# Patient Record
Sex: Female | Born: 1985 | Race: White | Hispanic: Yes | Smoking: Never smoker
Health system: Southern US, Community
[De-identification: ages and names within clinical notes are randomized; demographics above are authoritative.]

## PROBLEM LIST (undated history)

## (undated) DIAGNOSIS — R112 Nausea with vomiting, unspecified: Secondary | ICD-10-CM

## (undated) DIAGNOSIS — IMO0002 Reserved for concepts with insufficient information to code with codable children: Secondary | ICD-10-CM

## (undated) DIAGNOSIS — Z975 Presence of (intrauterine) contraceptive device: Secondary | ICD-10-CM

## (undated) DIAGNOSIS — B977 Papillomavirus as the cause of diseases classified elsewhere: Secondary | ICD-10-CM

## (undated) DIAGNOSIS — R87629 Unspecified abnormal cytological findings in specimens from vagina: Secondary | ICD-10-CM

## (undated) DIAGNOSIS — Z789 Other specified health status: Secondary | ICD-10-CM

## (undated) DIAGNOSIS — R87619 Unspecified abnormal cytological findings in specimens from cervix uteri: Secondary | ICD-10-CM

## (undated) HISTORY — PX: ANKLE SURGERY: SHX546

## (undated) HISTORY — DX: Nausea with vomiting, unspecified: R11.2

## (undated) HISTORY — DX: Unspecified abnormal cytological findings in specimens from cervix uteri: R87.619

## (undated) HISTORY — PX: FRACTURE SURGERY: SHX138

## (undated) HISTORY — PX: KNEE SURGERY: SHX244

## (undated) HISTORY — DX: Presence of (intrauterine) contraceptive device: Z97.5

## (undated) HISTORY — PX: LEG SURGERY: SHX1003

## (undated) HISTORY — DX: Papillomavirus as the cause of diseases classified elsewhere: B97.7

## (undated) HISTORY — PX: DILATION AND CURETTAGE OF UTERUS: SHX78

## (undated) HISTORY — DX: Reserved for concepts with insufficient information to code with codable children: IMO0002

## (undated) HISTORY — DX: Unspecified abnormal cytological findings in specimens from vagina: R87.629

---
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
## 2007-07-19 ENCOUNTER — Other Ambulatory Visit: Admission: RE | Admit: 2007-07-19 | Discharge: 2007-07-19 | Payer: Self-pay | Admitting: Obstetrics & Gynecology

## 2008-08-12 ENCOUNTER — Emergency Department (HOSPITAL_COMMUNITY): Admission: EM | Admit: 2008-08-12 | Discharge: 2008-08-12 | Payer: Self-pay | Admitting: Emergency Medicine

## 2008-09-19 ENCOUNTER — Other Ambulatory Visit: Admission: RE | Admit: 2008-09-19 | Discharge: 2008-09-19 | Payer: Self-pay | Admitting: Obstetrics & Gynecology

## 2010-03-28 ENCOUNTER — Emergency Department (HOSPITAL_COMMUNITY): Admission: EM | Admit: 2010-03-28 | Discharge: 2010-03-28 | Payer: Self-pay | Admitting: Emergency Medicine

## 2010-10-03 ENCOUNTER — Emergency Department (HOSPITAL_COMMUNITY)
Admission: EM | Admit: 2010-10-03 | Discharge: 2010-10-03 | Payer: Self-pay | Source: Home / Self Care | Admitting: Emergency Medicine

## 2010-10-24 ENCOUNTER — Encounter (HOSPITAL_COMMUNITY)
Admission: RE | Admit: 2010-10-24 | Discharge: 2010-11-19 | Payer: Self-pay | Source: Home / Self Care | Attending: Obstetrics and Gynecology | Admitting: Obstetrics and Gynecology

## 2010-11-22 ENCOUNTER — Ambulatory Visit (HOSPITAL_COMMUNITY)
Admission: RE | Admit: 2010-11-22 | Discharge: 2010-11-22 | Disposition: A | Discharge status: Home / Self Care | Payer: Medicaid Other | Source: Ambulatory Visit | Attending: Rehabilitation | Admitting: Rehabilitation

## 2010-11-22 DIAGNOSIS — IMO0001 Reserved for inherently not codable concepts without codable children: Secondary | ICD-10-CM | POA: Insufficient documentation

## 2010-11-22 DIAGNOSIS — M6281 Muscle weakness (generalized): Secondary | ICD-10-CM | POA: Insufficient documentation

## 2010-11-22 DIAGNOSIS — R262 Difficulty in walking, not elsewhere classified: Secondary | ICD-10-CM | POA: Insufficient documentation

## 2010-11-22 DIAGNOSIS — M2550 Pain in unspecified joint: Secondary | ICD-10-CM | POA: Insufficient documentation

## 2010-11-26 ENCOUNTER — Ambulatory Visit (HOSPITAL_COMMUNITY)
Admission: RE | Admit: 2010-11-26 | Discharge: 2010-11-26 | Disposition: A | Discharge status: Home / Self Care | Payer: Medicaid Other | Source: Ambulatory Visit | Attending: Rehabilitation | Admitting: Rehabilitation

## 2010-11-26 DIAGNOSIS — IMO0001 Reserved for inherently not codable concepts without codable children: Secondary | ICD-10-CM | POA: Insufficient documentation

## 2010-11-26 DIAGNOSIS — M2550 Pain in unspecified joint: Secondary | ICD-10-CM | POA: Insufficient documentation

## 2010-11-26 DIAGNOSIS — M25676 Stiffness of unspecified foot, not elsewhere classified: Secondary | ICD-10-CM | POA: Insufficient documentation

## 2010-11-26 DIAGNOSIS — M25673 Stiffness of unspecified ankle, not elsewhere classified: Secondary | ICD-10-CM | POA: Insufficient documentation

## 2010-11-26 DIAGNOSIS — M6281 Muscle weakness (generalized): Secondary | ICD-10-CM | POA: Insufficient documentation

## 2010-11-26 DIAGNOSIS — R262 Difficulty in walking, not elsewhere classified: Secondary | ICD-10-CM | POA: Insufficient documentation

## 2010-11-26 DIAGNOSIS — M25579 Pain in unspecified ankle and joints of unspecified foot: Secondary | ICD-10-CM | POA: Insufficient documentation

## 2010-11-26 DIAGNOSIS — M79609 Pain in unspecified limb: Secondary | ICD-10-CM | POA: Insufficient documentation

## 2010-11-28 ENCOUNTER — Ambulatory Visit (HOSPITAL_COMMUNITY)
Admission: RE | Admit: 2010-11-28 | Discharge: 2010-11-28 | Disposition: A | Discharge status: Home / Self Care | Payer: Medicaid Other | Source: Ambulatory Visit | Attending: Rehabilitation | Admitting: Rehabilitation

## 2010-11-28 ENCOUNTER — Ambulatory Visit (HOSPITAL_COMMUNITY): Payer: Self-pay

## 2010-11-28 DIAGNOSIS — M25673 Stiffness of unspecified ankle, not elsewhere classified: Secondary | ICD-10-CM | POA: Insufficient documentation

## 2010-11-28 DIAGNOSIS — R262 Difficulty in walking, not elsewhere classified: Secondary | ICD-10-CM | POA: Insufficient documentation

## 2010-11-28 DIAGNOSIS — IMO0001 Reserved for inherently not codable concepts without codable children: Secondary | ICD-10-CM | POA: Insufficient documentation

## 2010-11-28 DIAGNOSIS — M25579 Pain in unspecified ankle and joints of unspecified foot: Secondary | ICD-10-CM | POA: Insufficient documentation

## 2010-11-28 DIAGNOSIS — M6281 Muscle weakness (generalized): Secondary | ICD-10-CM | POA: Insufficient documentation

## 2010-11-28 DIAGNOSIS — M25676 Stiffness of unspecified foot, not elsewhere classified: Secondary | ICD-10-CM | POA: Insufficient documentation

## 2010-12-03 ENCOUNTER — Ambulatory Visit (HOSPITAL_COMMUNITY)
Admission: RE | Admit: 2010-12-03 | Discharge: 2010-12-03 | Disposition: A | Discharge status: Home / Self Care | Payer: Medicaid Other | Source: Ambulatory Visit | Attending: Obstetrics and Gynecology | Admitting: Obstetrics and Gynecology

## 2010-12-03 DIAGNOSIS — M25579 Pain in unspecified ankle and joints of unspecified foot: Secondary | ICD-10-CM | POA: Insufficient documentation

## 2010-12-03 DIAGNOSIS — M6281 Muscle weakness (generalized): Secondary | ICD-10-CM | POA: Insufficient documentation

## 2010-12-03 DIAGNOSIS — R262 Difficulty in walking, not elsewhere classified: Secondary | ICD-10-CM | POA: Insufficient documentation

## 2010-12-03 DIAGNOSIS — M25673 Stiffness of unspecified ankle, not elsewhere classified: Secondary | ICD-10-CM | POA: Insufficient documentation

## 2010-12-03 DIAGNOSIS — M25569 Pain in unspecified knee: Secondary | ICD-10-CM | POA: Insufficient documentation

## 2010-12-03 DIAGNOSIS — M25676 Stiffness of unspecified foot, not elsewhere classified: Secondary | ICD-10-CM | POA: Insufficient documentation

## 2010-12-03 DIAGNOSIS — M25669 Stiffness of unspecified knee, not elsewhere classified: Secondary | ICD-10-CM | POA: Insufficient documentation

## 2010-12-03 DIAGNOSIS — IMO0001 Reserved for inherently not codable concepts without codable children: Secondary | ICD-10-CM | POA: Insufficient documentation

## 2010-12-05 ENCOUNTER — Ambulatory Visit (HOSPITAL_COMMUNITY)
Admission: RE | Admit: 2010-12-05 | Discharge: 2010-12-05 | Disposition: A | Discharge status: Home / Self Care | Payer: Medicaid Other | Source: Ambulatory Visit | Attending: *Deleted | Admitting: *Deleted

## 2011-03-07 NOTE — Op Note (Signed)
Judy Lang, Judy Lang                      ACCOUNT NO.:  [id number]   MEDICAL RECORD NO.:  [id number]                   PATIENT TYPE:  AMB   LOCATION:  DAY                                  FACILITY:  [facility]   PHYSICIAN:  Lazaro Arms, M.D.                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   PROCEDURE:  Cervical dilatation with suction and sharp uterine curettage.   SURGEON:  Lazaro Arms, M.D.   ANESTHESIA:  Laryngeal mask airway.   FINDINGS:  The patient had significantly elevated HCGs in the office, with a  very unusual appearance to the intrauterine pregnancy, consistent with a  molar pregnancy.  As a result, she is admitted for suction D&C for a  nonviable pregnancy and evaluation for a molar pregnancy.   DESCRIPTION OF PROCEDURE:  The patient was taken to the operating room and  placed in the supine position where she underwent laryngeal mask airway.  She was placed in the dorsal lithotomy position and prepped and draped in  the usual sterile fashion.  The bladder was drained.   A Graves speculum was placed.  The cervix was grasped.  A paracervical block  was placed.  The cervix was dilated serially to allow passage of a 9 curved  suction curette.  Curettage was performed, and removal of all intrauterine  contents was performed.  A good uterine cry was obtained.  There was minimal  bleeding.  Methergine was given prophylactically.  The instruments were  removed.   The patient was awakened from anesthesia and was taken to the recovery room  in good and stable condition.  All counts were correct.  She received Ancef  prophylactically.  She had minimal bleeding.      ___________________________________________                                            Lazaro Arms, M.D.   LHE/MEDQ  D:  03/26/2004  T:  03/27/2004  Job:  [id number]

## 2011-03-07 NOTE — H&P (Signed)
NAMEKEIR, FOLAND NO.:  [id number]   MEDICAL RECORD NO.:  [id number]                  PATIENT TYPE:   LOCATION:                                       FACILITY:   PHYSICIAN:  Lazaro Arms, M.D.                DATE OF BIRTH:  [birth year]   DATE OF ADMISSION:  DATE OF DISCHARGE:                                HISTORY & PHYSICAL   HISTORY OF PRESENT ILLNESS:  Judy Lang is a 25 year old Hispanic female  gravida 1, para 0, last menstrual period January 19, 2004 who came into the  office last week having some bleeding and an ultrasound that was very  unusual, was very complex.  There was no fetal pole.  There were large lakes  in the trophoblastic tissue and it reminded me of a molar pregnancy.  We had  a quantitative HCG that was 55,000 and a repeat that was 66,000.  Ultrasound  was repeated and confirmed the findings.  As a result, she is admitted for a  D and C for probable molar pregnancy.   PAST MEDICAL HISTORY:  Negative.   PAST SURGICAL HISTORY:  Negative.   OBSTETRICAL HISTORY:  She is nulliparous.   ALLERGIES:  None.   MEDICATIONS:  None.   REVIEW OF SYMPTOMS:  Negative.  She is O positive.   PHYSICAL EXAMINATION:  HEENT:  Unremarkable.  NECK:  Thyroid is normal.  LUNGS:  Clear.  HEART:  Regular rate and rhythm without murmurs, rubs or gallops.  BREASTS:  Without masses, discharge or skin changes.  ABDOMEN:  Benign.  PELVIC:  Benign.  EXTREMITIES:  Warm with no edema.  NEUROLOGICAL:  Grossly intact.   IMPRESSION:  Nonviable intrauterine pregnancy, probable molar pregnancy.   PLAN:  Patient is admitted for a suction and sharp dilatation and curettage.  Patient and her aunt, who is her legal Guardian, understand that this  probably is a molar pregnancy and will require special treatment after  delivery with following HCG's, etc.  They voiced understanding and will  proceed.     ___________________________________________                                 Lazaro Arms, M.D.   [name]  D:  03/25/2004  T:  03/25/2004  Job:  [id number]

## 2011-06-17 ENCOUNTER — Emergency Department (HOSPITAL_COMMUNITY)
Admission: EM | Admit: 2011-06-17 | Discharge: 2011-06-17 | Disposition: A | Discharge status: Home / Self Care | Payer: Medicaid Other | Attending: Emergency Medicine | Admitting: Emergency Medicine

## 2011-06-17 DIAGNOSIS — R110 Nausea: Secondary | ICD-10-CM | POA: Insufficient documentation

## 2011-06-17 DIAGNOSIS — Z91040 Latex allergy status: Secondary | ICD-10-CM | POA: Insufficient documentation

## 2011-06-17 DIAGNOSIS — F17200 Nicotine dependence, unspecified, uncomplicated: Secondary | ICD-10-CM | POA: Insufficient documentation

## 2011-06-17 DIAGNOSIS — R1084 Generalized abdominal pain: Secondary | ICD-10-CM

## 2011-06-17 LAB — URINALYSIS, ROUTINE W REFLEX MICROSCOPIC
Glucose, UA: NEGATIVE mg/dL
Ketones, ur: NEGATIVE mg/dL
Leukocytes, UA: NEGATIVE
Nitrite: NEGATIVE
Protein, ur: NEGATIVE mg/dL
Specific Gravity, Urine: 1.03 (ref 1.005–1.030)
Urobilinogen, UA: 0.2 mg/dL (ref 0.0–1.0)

## 2011-06-17 LAB — BASIC METABOLIC PANEL
BUN: 10 mg/dL (ref 6–23)
CO2: 26 mEq/L (ref 19–32)
Calcium: 9.6 mg/dL (ref 8.4–10.5)
Chloride: 100 mEq/L (ref 96–112)
Creatinine, Ser: 0.47 mg/dL — ABNORMAL LOW (ref 0.50–1.10)
GFR calc Af Amer: >60 mL/min (ref >60)

## 2011-06-17 LAB — CBC
MCHC: 33.3 g/dL (ref 30.0–36.0)
RBC: 4.44 MIL/uL (ref 3.87–5.11)
RDW: 13.5 % (ref 11.5–15.5)
WBC: 9.1 10*3/uL (ref 4.0–10.5)

## 2011-06-17 LAB — DIFFERENTIAL
Eosinophils Absolute: 0.1 10*3/uL (ref 0.0–0.7)
Eosinophils Relative: 2 % (ref 0–5)
Monocytes Absolute: 0.6 10*3/uL (ref 0.1–1.0)
Monocytes Relative: 7 % (ref 3–12)
Neutrophils Relative %: 57 % (ref 43–77)

## 2011-06-17 LAB — URINE MICROSCOPIC-ADD ON

## 2011-06-17 MED ORDER — ONDANSETRON HCL 4 MG PO TABS: 4.0000 mg | ORAL_TABLET | Freq: Four times a day (QID) | ORAL | Status: AC

## 2011-06-17 MED ORDER — MORPHINE SULFATE 4 MG/ML IJ SOLN
4.0000 mg | Freq: Once | INTRAMUSCULAR | Status: AC
  Administered 2011-06-17: 4 mg via INTRAVENOUS
  Filled 2011-06-17: qty 1

## 2011-06-17 MED ORDER — HYDROCODONE-ACETAMINOPHEN 5-325 MG PO TABS: 1.0000 | ORAL_TABLET | ORAL | Status: AC | PRN

## 2011-06-17 MED ORDER — SODIUM CHLORIDE 0.9 % IV SOLN
INTRAVENOUS | Status: DC
  Administered 2011-06-17: 13:00:00 via INTRAVENOUS

## 2011-06-17 MED ORDER — SODIUM CHLORIDE 0.9 % IV BOLUS (SEPSIS)
1000.0000 mL | Freq: Once | INTRAVENOUS | Status: AC
  Administered 2011-06-17: 1000 mL via INTRAVENOUS

## 2011-06-17 MED ORDER — ONDANSETRON HCL 4 MG/2ML IJ SOLN
4.0000 mg | Freq: Once | INTRAMUSCULAR | Status: AC
  Administered 2011-06-17: 4 mg via INTRAVENOUS
  Filled 2011-06-17: qty 2

## 2011-06-17 NOTE — ED Notes (Signed)
Patient given Rx for Hydrocodone [facility] 5-325

## 2011-06-17 NOTE — ED Notes (Signed)
Pt actively vomiting; Dr. Adriana Simas notified and orders rec'd; medicated for nausea; NS bolus complete; NS 2nd liter infusing per pump at 127ml/h as ordered.

## 2011-06-17 NOTE — ED Notes (Signed)
Complain of abd pain, vomiting and sweating at night

## 2011-06-17 NOTE — ED Provider Notes (Signed)
History   Chart scribed for Donnetta Hutching, MD by [facility]; the patient was seen in room [facility]; this patient's care was started at 10:26 AM.    CSN: [id number] Arrival date & time: 06/17/2011  9:49 AM  Chief Complaint  Patient presents with  . Emesis   HPI Judy Lang is a 25 y.o. female who presents to the Emergency Department complaining of abd pain. Pt reports midline suprapubic abd pain onset this AM, constant pain. Pt also reports vomiting every morning for the past week. Decreased appetite this AM but eating normally prior to today. No change in urination or BMs. Denies diarrhea, fever, chills, dysuria, vaginal bleeding, or vaginal discharge. LNMP 05-17-11, reports it was very light and only lasted 2 days. States it is possible she is pregnant.  No PCP   History reviewed. No pertinent past medical history.  Past Surgical History  Procedure Date  . Fracture surgery     History reviewed. No pertinent family history.  History  Substance Use Topics  . Smoking status: Current Some Day Smoker  . Smokeless tobacco: Not on file  . Alcohol Use: No    OB History    Grav Para Term Preterm Abortions TAB SAB Ect Mult Living                 Previous Medications   CHOLECALCIFEROL (VITAMIN D PO)    Take 1 tablet by mouth 2 (two) times daily.     OYSTER SHELL (OYSTER CALCIUM) 500 MG TABS    Take 500 mg by mouth 2 (two) times daily.       Allergies as of 06/17/2011 - Review Complete 06/17/2011  Allergen Reaction Noted  . Latex Itching 06/17/2011      Review of Systems 10 Systems reviewed and are negative for acute change except as noted in the HPI.  Physical Exam  BP 103/74  Pulse 72  Temp(Src) 98.2 F (36.8 C) (Oral)  Resp 16  Ht 5\' 4"  (1.626 m)  Wt 109 lb (49.442 kg)  BMI 18.71 kg/m2  SpO2 99%  LMP 05/17/2011  Physical Exam  Nursing note and vitals reviewed. Constitutional: She is oriented to person, place, and time. No distress.       Appearance  consistent with age of record  HENT:  Head: Normocephalic and atraumatic.  Right Ear: External ear normal.  Left Ear: External ear normal.  Nose: Nose normal.  Mouth/Throat: Oropharynx is clear and moist.  Eyes: Conjunctivae are normal.  Neck: Neck supple.  Cardiovascular: Normal rate and regular rhythm.  Exam reveals no gallop and no friction rub.   No murmur heard. Pulmonary/Chest: Effort normal and breath sounds normal. She has no wheezes. She has no rhonchi. She has no rales. She exhibits no tenderness.  Abdominal: Soft. Bowel sounds are normal. There is tenderness (suprapubic).  Musculoskeletal: Normal range of motion.       Normal appearance of extremities  Neurological: She is alert and oriented to person, place, and time. No sensory deficit.  Skin: No rash noted.       Color normal  Psychiatric: She has a normal mood and affect.    ED Course  Procedures  OTHER DATA REVIEWED: Nursing notes and vital signs reviewed. Prior records reviewed.   LABS / RADIOLOGY: Results for orders placed during the hospital encounter of 06/17/11  POCT PREGNANCY, URINE      Component Value Range   Preg Test, Ur NEGATIVE    URINALYSIS, ROUTINE W REFLEX MICROSCOPIC  Component Value Range   Color, Urine YELLOW  YELLOW    Appearance CLEAR  CLEAR    Specific Gravity, Urine 1.030  1.005 - 1.030    pH 5.5  5.0 - 8.0    Glucose, UA NEGATIVE  NEGATIVE (mg/dL)   Hgb urine dipstick TRACE (*) NEGATIVE    Bilirubin Urine NEGATIVE  NEGATIVE    Ketones, ur NEGATIVE  NEGATIVE (mg/dL)   Protein, ur NEGATIVE  NEGATIVE (mg/dL)   Urobilinogen, UA 0.2  0.0 - 1.0 (mg/dL)   Nitrite NEGATIVE  NEGATIVE    Leukocytes, UA NEGATIVE  NEGATIVE   CBC      Component Value Range   WBC 9.1  4.0 - 10.5 (K/uL)   RBC 4.44  3.87 - 5.11 (MIL/uL)   Hemoglobin 13.7  12.0 - 15.0 (g/dL)   HCT 40.1  02.7 - 25.3 (%)   MCV 92.6  78.0 - 100.0 (fL)   MCH 30.9  26.0 - 34.0 (pg)   MCHC 33.3  30.0 - 36.0 (g/dL)   RDW  66.4  40.3 - 47.4 (%)   Platelets 265  150 - 400 (K/uL)  DIFFERENTIAL      Component Value Range   Neutrophils Relative 57  43 - 77 (%)   Neutro Abs 5.2  1.7 - 7.7 (K/uL)   Lymphocytes Relative 34  12 - 46 (%)   Lymphs Abs 3.1  0.7 - 4.0 (K/uL)   Monocytes Relative 7  3 - 12 (%)   Monocytes Absolute 0.6  0.1 - 1.0 (K/uL)   Eosinophils Relative 2  0 - 5 (%)   Eosinophils Absolute 0.1  0.0 - 0.7 (K/uL)   Basophils Relative 0  0 - 1 (%)   Basophils Absolute 0.0  0.0 - 0.1 (K/uL)  BASIC METABOLIC PANEL      Component Value Range   Sodium 136  135 - 145 (mEq/L)   Potassium 3.3 (*) 3.5 - 5.1 (mEq/L)   Chloride 100  96 - 112 (mEq/L)   CO2 26  19 - 32 (mEq/L)   Glucose, Bld 104 (*) 70 - 99 (mg/dL)   BUN 10  6 - 23 (mg/dL)   Creatinine, Ser 2.59 (*) 0.50 - 1.10 (mg/dL)   Calcium 9.6  8.4 - 56.3 (mg/dL)   GFR calc non Af Amer >60  >60 (mL/min)   GFR calc Af Amer >60  >60 (mL/min)  URINE MICROSCOPIC-ADD ON      Component Value Range   Squamous Epithelial / LPF FEW (*) RARE    RBC / HPF 0-2  <3 (RBC/hpf)   Bacteria, UA RARE  RARE      ED COURSE: 2:27 PM Pt still c/o lower abd pain and persistent nausea, requesting more pain meds.   All results reviewed and discussed with pt, questions answered, pt agreeable with plan.   MDM: Patient has no vaginal bleeding or discharge. No acute abdomen on physical exam. Will hydrate, labs, pain medicine. Recheck at 1600. Patient feeling much better. No acute abdomen. IMPRESSION: 1. Abdominal pain, generalized       MEDS GIVEN IN ED: 0.9 %  sodium chloride infusion (  Intravenous New Bag 06/17/11 1320)  sodium chloride 0.9 % bolus 1,000 mL (1000 mL Intravenous Given 06/17/11 1119)  ondansetron (ZOFRAN) injection 4 mg (4 mg Intravenous Given 06/17/11 1119)  morphine injection 4 mg (4 mg Intravenous Given 06/17/11 1119)  ondansetron (ZOFRAN) injection 4 mg (4 mg Intravenous Given 06/17/11 1317)  DISCHARGE MEDICATIONS: New Prescriptions    HYDROCODONE-ACETAMINOPHEN (NORCO) 5-325 MG PER TABLET    Take 1-2 tablets by mouth every 4 (four) hours as needed for pain.   ONDANSETRON (ZOFRAN) 4 MG TABLET    Take 1 tablet (4 mg total) by mouth every 6 (six) hours.     SCRIBE ATTESTATION:I personally performed the services described in this documentation, which was scribed in my presence. The recorded information has been reviewed and considered. Donnetta Hutching, MD     Donnetta Hutching, MD 06/17/11 [phone]

## 2011-07-21 LAB — PREGNANCY, URINE: Preg Test, Ur: NEGATIVE

## 2011-07-31 DIAGNOSIS — S82202B Unspecified fracture of shaft of left tibia, initial encounter for open fracture type I or II: Secondary | ICD-10-CM | POA: Insufficient documentation

## 2011-09-26 ENCOUNTER — Other Ambulatory Visit: Payer: Self-pay | Admitting: Adult Health

## 2011-09-26 ENCOUNTER — Other Ambulatory Visit (HOSPITAL_COMMUNITY)
Admission: RE | Admit: 2011-09-26 | Discharge: 2011-09-26 | Disposition: A | Discharge status: Home / Self Care | Payer: Medicaid Other | Source: Ambulatory Visit | Attending: Obstetrics and Gynecology | Admitting: Obstetrics and Gynecology

## 2011-09-26 DIAGNOSIS — Z01419 Encounter for gynecological examination (general) (routine) without abnormal findings: Secondary | ICD-10-CM | POA: Insufficient documentation

## 2011-09-26 DIAGNOSIS — Z113 Encounter for screening for infections with a predominantly sexual mode of transmission: Secondary | ICD-10-CM | POA: Insufficient documentation

## 2011-09-26 LAB — OB RESULTS CONSOLE GC/CHLAMYDIA: Chlamydia: NEGATIVE

## 2011-09-26 LAB — OB RESULTS CONSOLE HEPATITIS B SURFACE ANTIGEN: Hepatitis B Surface Ag: NEGATIVE

## 2011-10-21 NOTE — L&D Delivery Note (Signed)
Delivery Note At 4:45 AM a viable female was delivered via Vaginal, Spontaneous Delivery Homero Fellers Breech Presentation ).  APGAR: 8,9 ; weight .   Placenta status: Intact, Expressed.  Cord: 3 vessels with the following complications: None.  Cord pH: 7.31  Anesthesia: Local  Episiotomy: None Lacerations: 2nd degree;Perineal;Labial Suture Repair: vicryl rapide Est. Blood Loss (mL): 500  Mom to postpartum.  Baby to nursery-stable.  Kern Gingras H. 04/25/2012, 5:25 AM

## 2012-04-25 ENCOUNTER — Encounter (HOSPITAL_COMMUNITY): Payer: Self-pay | Admitting: *Deleted

## 2012-04-25 ENCOUNTER — Inpatient Hospital Stay (HOSPITAL_COMMUNITY)
Admission: AD | Admit: 2012-04-25 | Discharge: 2012-04-26 | DRG: 775 | Disposition: A | Discharge status: Home / Self Care | Payer: Medicaid Other | Source: Ambulatory Visit | Attending: Obstetrics & Gynecology | Admitting: Obstetrics & Gynecology

## 2012-04-25 DIAGNOSIS — O321XX0 Maternal care for breech presentation, not applicable or unspecified: Secondary | ICD-10-CM

## 2012-04-25 HISTORY — DX: Other specified health status: Z78.9

## 2012-04-25 LAB — CBC
Hemoglobin: 12.8 g/dL (ref 12.0–15.0)
MCH: 30.8 pg (ref 26.0–34.0)
MCHC: 33.6 g/dL (ref 30.0–36.0)
Platelets: 183 10*3/uL (ref 150–400)

## 2012-04-25 LAB — RPR: RPR Ser Ql: NONREACTIVE

## 2012-04-25 LAB — ABO/RH: ABO/RH(D): O POS

## 2012-04-25 LAB — OB RESULTS CONSOLE ABO/RH

## 2012-04-25 MED ORDER — PRENATAL MULTIVITAMIN CH
1.0000 | ORAL_TABLET | Freq: Every day | ORAL | Status: DC
  Administered 2012-04-25 – 2012-04-26 (×2): 1 via ORAL
  Filled 2012-04-25 (×2): qty 1

## 2012-04-25 MED ORDER — HYDROXYZINE HCL 50 MG PO TABS
50.0000 mg | ORAL_TABLET | Freq: Four times a day (QID) | ORAL | Status: DC | PRN
  Filled 2012-04-25: qty 1

## 2012-04-25 MED ORDER — ONDANSETRON HCL 4 MG PO TABS: 4.0000 mg | ORAL_TABLET | ORAL | Status: DC | PRN

## 2012-04-25 MED ORDER — OXYTOCIN BOLUS FROM INFUSION
250.0000 mL | Freq: Once | INTRAVENOUS | Status: DC
  Filled 2012-04-25: qty 500

## 2012-04-25 MED ORDER — ZOLPIDEM TARTRATE 5 MG PO TABS: 5.0000 mg | ORAL_TABLET | Freq: Every evening | ORAL | Status: DC | PRN

## 2012-04-25 MED ORDER — NALBUPHINE SYRINGE 5 MG/0.5 ML
5.0000 mg | INJECTION | INTRAMUSCULAR | Status: DC | PRN
  Filled 2012-04-25: qty 1

## 2012-04-25 MED ORDER — IBUPROFEN 600 MG PO TABS
600.0000 mg | ORAL_TABLET | Freq: Four times a day (QID) | ORAL | Status: DC | PRN
  Filled 2012-04-25 (×5): qty 1

## 2012-04-25 MED ORDER — HYDROMORPHONE HCL PF 1 MG/ML IJ SOLN
INTRAMUSCULAR | Status: AC
  Administered 2012-04-25: 2 mg
  Filled 2012-04-25: qty 2

## 2012-04-25 MED ORDER — HYDROXYZINE HCL 50 MG/ML IM SOLN
50.0000 mg | Freq: Four times a day (QID) | INTRAMUSCULAR | Status: DC | PRN
  Filled 2012-04-25: qty 1

## 2012-04-25 MED ORDER — TETANUS-DIPHTH-ACELL PERTUSSIS 5-2.5-18.5 LF-MCG/0.5 IM SUSP
0.5000 mL | Freq: Once | INTRAMUSCULAR | Status: AC
  Administered 2012-04-26: 0.5 mL via INTRAMUSCULAR
  Filled 2012-04-25: qty 0.5

## 2012-04-25 MED ORDER — ONDANSETRON HCL 4 MG/2ML IJ SOLN: 4.0000 mg | INTRAMUSCULAR | Status: DC | PRN

## 2012-04-25 MED ORDER — ONDANSETRON HCL 4 MG/2ML IJ SOLN: 4.0000 mg | Freq: Four times a day (QID) | INTRAMUSCULAR | Status: DC | PRN

## 2012-04-25 MED ORDER — OXYTOCIN 40 UNITS IN LACTATED RINGERS INFUSION - SIMPLE MED
INTRAVENOUS | Status: AC
  Administered 2012-04-25: 40 [IU]
  Filled 2012-04-25: qty 1000

## 2012-04-25 MED ORDER — LACTATED RINGERS IV SOLN: INTRAVENOUS | Status: DC

## 2012-04-25 MED ORDER — IBUPROFEN 600 MG PO TABS
600.0000 mg | ORAL_TABLET | Freq: Four times a day (QID) | ORAL | Status: DC
  Administered 2012-04-25 – 2012-04-26 (×5): 600 mg via ORAL

## 2012-04-25 MED ORDER — OXYCODONE-ACETAMINOPHEN 5-325 MG PO TABS
1.0000 | ORAL_TABLET | ORAL | Status: DC | PRN
  Administered 2012-04-25: 1 via ORAL
  Filled 2012-04-25: qty 1

## 2012-04-25 MED ORDER — SENNOSIDES-DOCUSATE SODIUM 8.6-50 MG PO TABS
2.0000 | ORAL_TABLET | Freq: Every day | ORAL | Status: DC
  Administered 2012-04-25: 2 via ORAL

## 2012-04-25 MED ORDER — DIBUCAINE 1 % RE OINT: 1.0000 "application " | TOPICAL_OINTMENT | RECTAL | Status: DC | PRN

## 2012-04-25 MED ORDER — LANOLIN HYDROUS EX OINT: TOPICAL_OINTMENT | CUTANEOUS | Status: DC | PRN

## 2012-04-25 MED ORDER — ACETAMINOPHEN 325 MG PO TABS: 650.0000 mg | ORAL_TABLET | ORAL | Status: DC | PRN

## 2012-04-25 MED ORDER — LIDOCAINE HCL (PF) 1 % IJ SOLN
INTRAMUSCULAR | Status: AC
  Filled 2012-04-25: qty 30

## 2012-04-25 MED ORDER — BENZOCAINE-MENTHOL 20-0.5 % EX AERO
1.0000 "application " | INHALATION_SPRAY | CUTANEOUS | Status: DC | PRN
  Administered 2012-04-25: 1 via TOPICAL
  Filled 2012-04-25: qty 56

## 2012-04-25 MED ORDER — METHYLERGONOVINE MALEATE 0.2 MG/ML IJ SOLN
INTRAMUSCULAR | Status: AC
  Administered 2012-04-25: 0.2 mg
  Filled 2012-04-25: qty 1

## 2012-04-25 MED ORDER — OXYTOCIN 40 UNITS IN LACTATED RINGERS INFUSION - SIMPLE MED: 62.5000 mL/h | Freq: Once | INTRAVENOUS | Status: DC

## 2012-04-25 MED ORDER — LACTATED RINGERS IV SOLN
500.0000 mL | INTRAVENOUS | Status: DC | PRN
Start: 2012-04-25 — End: 2012-04-26

## 2012-04-25 MED ORDER — LIDOCAINE HCL (PF) 1 % IJ SOLN
30.0000 mL | INTRAMUSCULAR | Status: DC | PRN
  Filled 2012-04-25: qty 30

## 2012-04-25 MED ORDER — OXYTOCIN 10 UNIT/ML IJ SOLN
INTRAMUSCULAR | Status: AC
  Filled 2012-04-25: qty 2

## 2012-04-25 MED ORDER — CITRIC ACID-SODIUM CITRATE 334-500 MG/5ML PO SOLN: 30.0000 mL | ORAL | Status: DC | PRN

## 2012-04-25 MED ORDER — WITCH HAZEL-GLYCERIN EX PADS: 1.0000 "application " | MEDICATED_PAD | CUTANEOUS | Status: DC | PRN

## 2012-04-25 MED ORDER — FLEET ENEMA 7-19 GM/118ML RE ENEM: 1.0000 | ENEMA | RECTAL | Status: DC | PRN

## 2012-04-25 MED ORDER — OXYCODONE-ACETAMINOPHEN 5-325 MG PO TABS: 1.0000 | ORAL_TABLET | ORAL | Status: DC | PRN

## 2012-04-25 MED ORDER — SIMETHICONE 80 MG PO CHEW: 80.0000 mg | CHEWABLE_TABLET | ORAL | Status: DC | PRN

## 2012-04-25 MED ORDER — DIPHENHYDRAMINE HCL 25 MG PO CAPS: 25.0000 mg | ORAL_CAPSULE | Freq: Four times a day (QID) | ORAL | Status: DC | PRN

## 2012-04-25 NOTE — H&P (Signed)
Judy Lang is a 26 y.o. female presenting with ctx and an urge to push. She receives her prenatal care at [facility] and had an essentially unremarkable course of care. History OB History    Grav Para Term Preterm Abortions TAB SAB Ect Mult Living   1              No past medical history on file. Past Surgical History  Procedure Date  . Fracture surgery    Family History: family history is not on file. Social History:  reports that she has been smoking.  She does not have any smokeless tobacco history on file. She reports that she does not drink alcohol or use illicit drugs.   Prenatal Transfer Tool  Maternal Diabetes: No Genetic Screening: Normal Maternal Ultrasounds/Referrals: Normal Fetal Ultrasounds or other Referrals:  None Maternal Substance Abuse:  Yes:  Type: Marijuana Significant Maternal Medications:  None Significant Maternal Lab Results:  Lab values include: Group B Strep negative Other Comments:  None  ROS    Blood pressure 120/71, pulse 69, last menstrual period 05/17/2011. Maternal Exam:  Introitus: Cx completely dilated with breech visible during involuntary pushing; bedside US also confirmed breech     Physical Exam  Constitutional: She is oriented to person, place, and time. She appears well-developed and well-nourished.  HENT:  Head: Normocephalic.  Cardiovascular: Normal rate.   Respiratory: Effort normal.  Musculoskeletal: Normal range of motion.  Neurological: She is alert and oriented to person, place, and time.  Skin: Skin is warm and dry.  Psychiatric: She has a normal mood and affect. Her behavior is normal.    Prenatal labs: ABO, Rh:  O+ Antibody:  neg Rubella:  imm RPR:   NR HBsAg:   neg HIV:   NR GBS:   neg  Assessment/Plan: IUP at 39.2 End first stage labor- breech  Dr Penne Lash called to MAU to attend birth. Labor too progressed to consider C/S or tx to L&D.   Judy Lang, Soyla 04/25/2012, 5:06 AM

## 2012-04-25 NOTE — Progress Notes (Signed)
CSW aware of consult, will await infant UDS results and then complete full consult with MOB.   

## 2012-04-25 NOTE — MAU Note (Signed)
Pt states contractions started at midnight. Arrived to MAU complaining of rectal pressure. SVE complete with BBOW & breech presentation. Dorathy Kinsman CNM called to bedside to confirm presentation. Pincus Badder CNM & Dr. Penne Lash called for delivery in MAU at 0440.

## 2012-04-26 ENCOUNTER — Encounter (HOSPITAL_COMMUNITY): Payer: Self-pay | Admitting: *Deleted

## 2012-04-26 LAB — CBC
HCT: 29.4 % — ABNORMAL LOW (ref 36.0–46.0)
Hemoglobin: 9.7 g/dL — ABNORMAL LOW (ref 12.0–15.0)
MCHC: 33 g/dL (ref 30.0–36.0)

## 2012-04-26 MED ORDER — IBUPROFEN 600 MG PO TABS: 600.0000 mg | ORAL_TABLET | Freq: Four times a day (QID) | ORAL | Status: AC | PRN

## 2012-04-26 NOTE — Discharge Summary (Signed)
I was present for the exam and agree with above.  [place], CNM 04/26/2012 8:59 AM

## 2012-04-26 NOTE — Progress Notes (Signed)
Ur chart review completed.  

## 2012-04-26 NOTE — H&P (Signed)
Pt seen and examined.  See delivery note.

## 2012-04-26 NOTE — Clinical Social Work Maternal (Signed)
    Clinical Social Work Department PSYCHOSOCIAL ASSESSMENT - MATERNAL/CHILD 04/26/2012  Patient:  Judy, Lang  Account Number:  [id number]  Admit Date:  04/25/2012  Marjo Bicker Name:   Cato Mulligan    Clinical Social Worker:  Andy Gauss   Date/Time:  04/26/2012 12:00 N  Date Referred:  04/26/2012   Referral source  CN     Referred reason  Substance Abuse   Other referral source:    I:  FAMILY / HOME ENVIRONMENT Child's legal guardian:  PARENT  Guardian - Name Guardian - Age Guardian - Address  Judy Lang [address]; [place], [place] [address]  Judy Lang 25    Other household support members/support persons Name Relationship DOB  Judy Lang MOTHER    Other support:   Pt's & FOB's family    II  PSYCHOSOCIAL DATA Information Source:  Patient Interview  Event organiser Employment:   Financial resources:  [employer] If Medicaid - County:  [employer] Other  [facility]  Food Stamps   School / Grade:   Maternity Care Coordinator / Child Services Coordination / Early Interventions:  Cultural issues impacting care:    III  STRENGTHS Strengths  Adequate Resources  Home prepared for Child (including basic supplies)  Supportive family/friends   Strength comment:    IV  RISK FACTORS AND CURRENT PROBLEMS Current Problem:  YES   Risk Factor & Current Problem Patient Issue Family Issue Risk Factor / Current Problem Comment  Substance Abuse Y N Hx of MJ use    V  SOCIAL WORK ASSESSMENT Pt admits to smoking MJ, "daily," prior to positive pregnancy confirmation at 8 weeks.  She continued to smoke for a couple more weeks until she stopped at 3 months.  She denies other illegal substance use.   Sw explained hospital drug testing policy and pt verbalized understanding.  UDS is negative, meconium results are pending.  She reports having good support and all the necessary supplies for the infant.  Sw will continue to monitor drug screen  results and make a referral if needed.      VI SOCIAL WORK PLAN Social Work Plan  No Further Intervention Required / No Barriers to Discharge   Type of pt/family education:   If child protective services report - county:   If child protective services report - date:   Information/referral to community resources comment:   Other social work plan:

## 2012-04-26 NOTE — Discharge Summary (Addendum)
Obstetric Discharge Summary Reason for Admission: onset of labor Prenatal Procedures: none Intrapartum Procedures: spontaneous vaginal delivery Postpartum Procedures: none Complications-Operative and Postpartum: 2nd degree perineal laceration Hemoglobin  Date Value Range Status  04/26/2012 9.7* 12.0 - 15.0 g/dL Final     REPEATED TO VERIFY     DELTA CHECK NOTED     HCT  Date Value Range Status  04/26/2012 29.4* 36.0 - 46.0 % Final   Patient reports bleeding has decreased and is now scant. She denied pain, but reports occasional mild cramping in her lower abdomen. She denied perineal pain. She offered no complaints. She is up ad lib and requesting to go home.  Physical Exam:  General: alert, cooperative and no distress Lochia: appropriate Uterine Fundus: firm Incision: none DVT Evaluation: No cords or calf tenderness. No significant calf/ankle edema.  Discharge Diagnoses: Term Pregnancy-delivered  Discharge Information: Date: 04/26/2012 Activity: unrestricted Diet: routine Medications: Ibuprofen Condition: stable Instructions: refer to practice specific booklet Discharge to: home Follow-up Information    Follow up with [facility]. Call in 1 week. (Indicate your interest to have a Depo Provera shot for birth control, and make appointment for postpartum follow up in 6 weeks.)    Contact information:   [address] [place] [address]          Newborn Data: Live born female  Birth Weight: 6 lb 1.9 oz (2775 g) APGAR: 8, 9  Mother is breastfeeding and reports baby is doing well.  Home with mother.  ** - Pt is requesting early discharge today. Nursing has been notified and they will inform the nursery and have pediatrics clear the baby for discharge. The patient has been informed of this process and expressed understanding.  She plans to maintain birth control with Depo Provera. See above Follow-Up information.  [place] 04/26/2012,  7:46 AM

## 2012-06-17 DIAGNOSIS — S82201A Unspecified fracture of shaft of right tibia, initial encounter for closed fracture: Secondary | ICD-10-CM | POA: Insufficient documentation

## 2013-02-02 ENCOUNTER — Encounter: Payer: Self-pay | Admitting: *Deleted

## 2013-02-02 DIAGNOSIS — B977 Papillomavirus as the cause of diseases classified elsewhere: Secondary | ICD-10-CM

## 2013-02-02 DIAGNOSIS — IMO0002 Reserved for concepts with insufficient information to code with codable children: Secondary | ICD-10-CM

## 2013-02-03 ENCOUNTER — Ambulatory Visit: Payer: Self-pay

## 2013-03-10 DIAGNOSIS — S5290XA Unspecified fracture of unspecified forearm, initial encounter for closed fracture: Secondary | ICD-10-CM | POA: Insufficient documentation

## 2013-03-10 DIAGNOSIS — Z09 Encounter for follow-up examination after completed treatment for conditions other than malignant neoplasm: Secondary | ICD-10-CM | POA: Insufficient documentation

## 2013-07-01 ENCOUNTER — Encounter: Payer: Self-pay | Admitting: Adult Health

## 2013-07-01 ENCOUNTER — Ambulatory Visit (INDEPENDENT_AMBULATORY_CARE_PROVIDER_SITE_OTHER): Payer: Medicaid Other | Admitting: Adult Health

## 2013-07-01 VITALS — BP 100/60 | Ht 64.0 in | Wt 101.0 lb

## 2013-07-01 DIAGNOSIS — Z3201 Encounter for pregnancy test, result positive: Secondary | ICD-10-CM

## 2013-07-01 DIAGNOSIS — Z3200 Encounter for pregnancy test, result unknown: Secondary | ICD-10-CM

## 2013-07-01 NOTE — Progress Notes (Signed)
Patient ID: Judy Lang, female   DOB: 1986-05-10, 27 y.o.   MRN: [id number] Pt here for UPT, pt given samples of PNV.

## 2013-07-05 ENCOUNTER — Other Ambulatory Visit: Payer: Self-pay | Admitting: Obstetrics & Gynecology

## 2013-07-05 DIAGNOSIS — O3680X0 Pregnancy with inconclusive fetal viability, not applicable or unspecified: Secondary | ICD-10-CM

## 2013-07-06 ENCOUNTER — Ambulatory Visit: Payer: Self-pay | Admitting: Obstetrics & Gynecology

## 2013-07-07 ENCOUNTER — Ambulatory Visit (INDEPENDENT_AMBULATORY_CARE_PROVIDER_SITE_OTHER): Payer: Medicaid Other

## 2013-07-07 DIAGNOSIS — O3680X0 Pregnancy with inconclusive fetal viability, not applicable or unspecified: Secondary | ICD-10-CM

## 2013-07-07 NOTE — Progress Notes (Signed)
U/S(7+6wks)- single IUP with +FCA noted, CRL c/w LMP dates, cx 3.4cm and closed, bilateral adnexa wnl, no free fluid noted within pelvis

## 2013-07-09 ENCOUNTER — Emergency Department (HOSPITAL_COMMUNITY)
Admission: EM | Admit: 2013-07-09 | Discharge: 2013-07-10 | Disposition: A | Discharge status: Home / Self Care | Payer: Medicaid Other | Attending: Emergency Medicine | Admitting: Emergency Medicine

## 2013-07-09 DIAGNOSIS — O209 Hemorrhage in early pregnancy, unspecified: Secondary | ICD-10-CM

## 2013-07-09 DIAGNOSIS — Z8619 Personal history of other infectious and parasitic diseases: Secondary | ICD-10-CM | POA: Insufficient documentation

## 2013-07-09 DIAGNOSIS — O200 Threatened abortion: Secondary | ICD-10-CM

## 2013-07-09 DIAGNOSIS — Z9889 Other specified postprocedural states: Secondary | ICD-10-CM | POA: Insufficient documentation

## 2013-07-09 DIAGNOSIS — Z91040 Latex allergy status: Secondary | ICD-10-CM | POA: Insufficient documentation

## 2013-07-10 ENCOUNTER — Encounter (HOSPITAL_COMMUNITY): Payer: Self-pay | Admitting: *Deleted

## 2013-07-10 LAB — URINALYSIS W MICROSCOPIC + REFLEX CULTURE
Bilirubin Urine: NEGATIVE
Ketones, ur: NEGATIVE mg/dL
Protein, ur: NEGATIVE mg/dL
Urobilinogen, UA: 0.2 mg/dL (ref 0.0–1.0)

## 2013-07-10 LAB — HCG, QUANTITATIVE, PREGNANCY: hCG, Beta Chain, Quant, S: 41767 m[IU]/mL — ABNORMAL HIGH (ref <5)

## 2013-07-10 LAB — WET PREP, GENITAL
Trich, Wet Prep: NONE SEEN
WBC, Wet Prep HPF POC: NONE SEEN
Yeast Wet Prep HPF POC: NONE SEEN

## 2013-07-10 LAB — CBC
MCHC: 33.2 g/dL (ref 30.0–36.0)
MCV: 90.7 fL (ref 78.0–100.0)
Platelets: 200 10*3/uL (ref 150–400)
RDW: 12.9 % (ref 11.5–15.5)
WBC: 7.9 10*3/uL (ref 4.0–10.5)

## 2013-07-10 NOTE — ED Notes (Signed)
Patient given discharge instruction, verbalized understand. Patient ambulatory out of the department with family  

## 2013-07-10 NOTE — ED Notes (Signed)
Pt states she is 2 months pregnant. Pt reports vaginal bleeding tonight while unloading groceries.
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
## 2013-07-11 LAB — GC/CHLAMYDIA PROBE AMP: CT Probe RNA: NEGATIVE

## 2013-07-12 ENCOUNTER — Ambulatory Visit (INDEPENDENT_AMBULATORY_CARE_PROVIDER_SITE_OTHER): Payer: Medicaid Other | Admitting: Obstetrics & Gynecology

## 2013-07-12 ENCOUNTER — Other Ambulatory Visit: Payer: Self-pay | Admitting: Obstetrics & Gynecology

## 2013-07-12 ENCOUNTER — Ambulatory Visit (INDEPENDENT_AMBULATORY_CARE_PROVIDER_SITE_OTHER): Payer: Medicaid Other

## 2013-07-12 ENCOUNTER — Encounter: Payer: Self-pay | Admitting: Obstetrics & Gynecology

## 2013-07-12 VITALS — BP 100/70 | Wt 98.0 lb

## 2013-07-12 DIAGNOSIS — O200 Threatened abortion: Secondary | ICD-10-CM

## 2013-07-12 DIAGNOSIS — Z331 Pregnant state, incidental: Secondary | ICD-10-CM

## 2013-07-12 DIAGNOSIS — Z1389 Encounter for screening for other disorder: Secondary | ICD-10-CM

## 2013-07-12 LAB — POCT URINALYSIS DIPSTICK
Ketones, UA: NEGATIVE
Protein, UA: NEGATIVE

## 2013-07-12 NOTE — Progress Notes (Signed)
U/S(8+4ws)-single IUP with +FCA noted, CRL c/w dates, cx long and closed, bilateral adnexa wnl, small Sub-Chorionic Hemmorhage noted=12x7mm, FHR=178 bpm

## 2013-07-12 NOTE — Patient Instructions (Addendum)
Threatened Miscarriage  Bleeding during the first 20 weeks of pregnancy is common. This is sometimes called a threatened miscarriage. This is a pregnancy that is threatening to end before the twentieth week of pregnancy. Often this bleeding stops with bed rest or decreased activities as suggested by your caregiver and the pregnancy continues without any more problems. You may be asked to not have sexual intercourse, have orgasms or use tampons until further notice. Sometimes a threatened miscarriage can progress to a complete or incomplete miscarriage. This may or may not require further treatment. Some miscarriages occur before a woman misses a menstrual period and knows she is pregnant.  Miscarriages occur in 15 to 20% of all pregnancies and usually occur during the first 13 weeks of the pregnancy. The exact cause of a miscarriage is usually never known. A miscarriage is natures way of ending a pregnancy that is abnormal or would not make it to term. There are some things that may put you at risk to have a miscarriage, such as:  · Hormone problems.  · Infection of the uterus or cervix.  · Chronic illness, diabetes for example, especially if it is not controlled.  · Abnormal shaped uterus.  · Fibroids in the uterus.  · Incompetent cervix (the cervix is too weak to hold the baby).  · Smoking.  · Drinking too much alcohol. It's best not to drink any alcohol when you are pregnant.  · Taking illegal drugs.  TREATMENT   When a miscarriage becomes complete and all products of conception (all the tissue in the uterus) have been passed, often no treatment is needed. If you think you passed tissue, save it in a container and take it to your doctor for evaluation. If the miscarriage is incomplete (parts of the fetus or placenta remain in the uterus), further treatment may be needed. The most common reason for further treatment is continued bleeding (hemorrhage) because pregnancy tissue did not pass out of the uterus. This  often occurs if a miscarriage is incomplete. Tissue left behind may also become infected. Treatment usually is dilatation and curettage (the removal of the remaining products of pregnancy. This can be done by a simple sucking procedure (suction curettage) or a simple scraping of the inside of the uterus. This may be done in the hospital or in the caregiver's office. This is only done when your caregiver knows that there is no chance for the pregnancy to proceed to term. This is determined by physical examination, negative pregnancy test, falling pregnancy hormone count and/or, an ultrasound revealing a dead fetus.  Miscarriages are often a very emotional time for prospective mothers and fathers. This is not you or your partners fault. It did not occur because of an inadequacy in you or your partner. Nearly all miscarriages occur because the pregnancy has started off wrongly. At least half of these pregnancies have a chromosomal abnormality. It is almost always not inherited. Others may have developmental problems with the fetus or placenta. This does not always show up even when the products miscarried are studied under the microscope. The miscarriage is nearly always not your fault and it is not likely that you could have prevented it from happening. If you are having emotional and grieving problems, talk to your health care provider and even seek counseling, if necessary, before getting pregnant again. You can begin trying for another pregnancy as soon as your caregiver says it is OK.  HOME CARE INSTRUCTIONS   · Your caregiver may order   bed rest depending on how much bleeding and cramping you are having. You may be limited to only getting up to go to the bathroom. You may be allowed to continue light activity. You may need to make arrangements for the care of your other children and for any other responsibilities.  · Keep track of the number of pads you use each day, how often you have to change pads and how  saturated (soaked) they are. Record this information.  · DO NOT USE TAMPONS. Do not douche, have sexual intercourse or orgasms until approved by your caregiver.  · You may receive a follow up appointment for re-evaluation of your pregnancy and a repeat blood test. Re-evaluation often occurs after 2 days and again in 4 to 6 weeks. It is very important that you follow-up in the recommended time period.  · If you are Rh negative and the father is Rh positive or you do not know the fathers' blood type, you may receive a shot (Rh immune globulin) to help prevent abnormal antibodies that can develop and affect the baby in any future pregnancies.  SEEK IMMEDIATE MEDICAL CARE IF:  · You have severe cramps in your stomach, back, or abdomen.  · You have a sudden onset of severe pain in the lower part of your abdomen.  · You develop chills.  · You run an unexplained temperature of 101° F (38.3° C) or higher.  · You pass large clots or tissue. Save any tissue for your caregiver to inspect.  · Your bleeding increases or you become light-headed, weak, or have fainting episodes.  · You have a gush of fluid from your vagina.  · You pass out. This could mean you have a tubal (ectopic) pregnancy.  Document Released: 10/06/2005 Document Revised: 12/29/2011 Document Reviewed: 05/22/2008  Exit[employer]® Patient Information ©2014 [employer].

## 2013-07-12 NOTE — Progress Notes (Signed)
Sonogram is reassuring with a small [place] bleed present and some blood in vault present. No sex until after 12 weeks or so. Keep scheduled appt

## 2013-07-13 ENCOUNTER — Encounter: Payer: Medicaid Other | Admitting: Obstetrics and Gynecology

## 2013-07-18 ENCOUNTER — Other Ambulatory Visit (HOSPITAL_COMMUNITY)
Admission: RE | Admit: 2013-07-18 | Discharge: 2013-07-18 | Disposition: A | Discharge status: Home / Self Care | Payer: Medicaid Other | Source: Ambulatory Visit | Attending: Adult Health | Admitting: Adult Health

## 2013-07-18 ENCOUNTER — Ambulatory Visit (INDEPENDENT_AMBULATORY_CARE_PROVIDER_SITE_OTHER): Payer: Medicaid Other | Admitting: Adult Health

## 2013-07-18 ENCOUNTER — Encounter: Payer: Self-pay | Admitting: Adult Health

## 2013-07-18 VITALS — BP 100/50 | Wt 103.0 lb

## 2013-07-18 DIAGNOSIS — O210 Mild hyperemesis gravidarum: Secondary | ICD-10-CM

## 2013-07-18 DIAGNOSIS — R112 Nausea with vomiting, unspecified: Secondary | ICD-10-CM

## 2013-07-18 DIAGNOSIS — Z113 Encounter for screening for infections with a predominantly sexual mode of transmission: Secondary | ICD-10-CM | POA: Insufficient documentation

## 2013-07-18 DIAGNOSIS — Z3481 Encounter for supervision of other normal pregnancy, first trimester: Secondary | ICD-10-CM

## 2013-07-18 DIAGNOSIS — Z1389 Encounter for screening for other disorder: Secondary | ICD-10-CM

## 2013-07-18 DIAGNOSIS — Z331 Pregnant state, incidental: Secondary | ICD-10-CM

## 2013-07-18 DIAGNOSIS — Z01419 Encounter for gynecological examination (general) (routine) without abnormal findings: Secondary | ICD-10-CM | POA: Insufficient documentation

## 2013-07-18 DIAGNOSIS — F1920 Other psychoactive substance dependence, uncomplicated: Secondary | ICD-10-CM

## 2013-07-18 HISTORY — DX: Nausea with vomiting, unspecified: R11.2

## 2013-07-18 LAB — CBC
MCHC: 33.3 g/dL (ref 30.0–36.0)
MCV: 89.7 fL (ref 78.0–100.0)
Platelets: 239 10*3/uL (ref 150–400)
RDW: 13.5 % (ref 11.5–15.5)
WBC: 8 10*3/uL (ref 4.0–10.5)

## 2013-07-18 LAB — POCT URINALYSIS DIPSTICK
Blood, UA: NEGATIVE
Glucose, UA: NEGATIVE
Leukocytes, UA: NEGATIVE
Nitrite, UA: NEGATIVE
Protein, UA: NEGATIVE

## 2013-07-18 NOTE — Progress Notes (Signed)
Pt here today for New OB visit. Pt states she is still having some brownish spotting. Pt denies any other problems or concerns at this time. Pt given CCNC form and lab consents to read over and sign.
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
## 2013-07-18 NOTE — Patient Instructions (Addendum)

## 2013-07-19 ENCOUNTER — Encounter: Payer: Self-pay | Admitting: Women's Health

## 2013-07-19 DIAGNOSIS — F1290 Cannabis use, unspecified, uncomplicated: Secondary | ICD-10-CM | POA: Insufficient documentation

## 2013-07-19 DIAGNOSIS — Z3480 Encounter for supervision of other normal pregnancy, unspecified trimester: Secondary | ICD-10-CM | POA: Insufficient documentation

## 2013-07-19 LAB — GC/CHLAMYDIA PROBE AMP
CT Probe RNA: NEGATIVE
GC Probe RNA: NEGATIVE

## 2013-07-19 LAB — HIV ANTIBODY (ROUTINE TESTING W REFLEX): HIV: NONREACTIVE

## 2013-07-19 LAB — SICKLE CELL SCREEN: Sickle Cell Screen: NEGATIVE

## 2013-07-19 LAB — ABO AND RH

## 2013-07-19 LAB — URINE CULTURE: Colony Count: NO GROWTH

## 2013-07-19 LAB — RUBELLA SCREEN: Rubella: 2.15 Index — ABNORMAL HIGH (ref <0.90)

## 2013-07-19 LAB — URINALYSIS
Bilirubin Urine: NEGATIVE
Hgb urine dipstick: NEGATIVE
Ketones, ur: NEGATIVE mg/dL
Nitrite: NEGATIVE
Protein, ur: NEGATIVE mg/dL
Urobilinogen, UA: 0.2 mg/dL (ref 0.0–1.0)

## 2013-07-19 LAB — DRUG SCREEN, URINE, NO CONFIRMATION
Barbiturate Quant, Ur: NEGATIVE
Benzodiazepines.: NEGATIVE
Creatinine,U: 50.5 mg/dL
Methadone: NEGATIVE
Propoxyphene: NEGATIVE

## 2013-07-19 LAB — HEPATITIS B SURFACE ANTIGEN: Hepatitis B Surface Ag: NEGATIVE

## 2013-07-19 LAB — TSH: TSH: 0.713 u[IU]/mL (ref 0.350–4.500)

## 2013-07-19 LAB — OXYCODONE SCREEN, UA, RFLX CONFIRM: Oxycodone Screen, Ur: NEGATIVE ng/mL

## 2013-07-23 ENCOUNTER — Encounter: Payer: Self-pay | Admitting: Women's Health

## 2013-07-27 ENCOUNTER — Telehealth: Payer: Self-pay | Admitting: Obstetrics and Gynecology

## 2013-07-27 NOTE — Telephone Encounter (Signed)
Voice mailbox not set up at 11:50am. JSY

## 2013-07-28 NOTE — Telephone Encounter (Signed)
Voice mailbox not set up at 4:24pm. JSY

## 2013-07-29 NOTE — Telephone Encounter (Signed)
Voice mailbox not set up @ 1:38 pm. JSY

## 2013-08-03 NOTE — Telephone Encounter (Signed)
Pt states she is feeling better now. Advised that we tried multiple times to reach her but her voice mailbox was not set up. JSY

## 2013-08-08 ENCOUNTER — Ambulatory Visit (INDEPENDENT_AMBULATORY_CARE_PROVIDER_SITE_OTHER): Payer: Medicaid Other

## 2013-08-08 ENCOUNTER — Other Ambulatory Visit: Payer: Self-pay | Admitting: Adult Health

## 2013-08-08 ENCOUNTER — Encounter (INDEPENDENT_AMBULATORY_CARE_PROVIDER_SITE_OTHER): Payer: Self-pay

## 2013-08-08 ENCOUNTER — Encounter: Payer: Self-pay | Admitting: Adult Health

## 2013-08-08 ENCOUNTER — Ambulatory Visit (INDEPENDENT_AMBULATORY_CARE_PROVIDER_SITE_OTHER): Payer: Medicaid Other | Admitting: Adult Health

## 2013-08-08 VITALS — BP 100/60 | Wt 103.0 lb

## 2013-08-08 DIAGNOSIS — F1920 Other psychoactive substance dependence, uncomplicated: Secondary | ICD-10-CM

## 2013-08-08 DIAGNOSIS — Z331 Pregnant state, incidental: Secondary | ICD-10-CM

## 2013-08-08 DIAGNOSIS — Z1389 Encounter for screening for other disorder: Secondary | ICD-10-CM

## 2013-08-08 DIAGNOSIS — Z36 Encounter for antenatal screening of mother: Secondary | ICD-10-CM

## 2013-08-08 DIAGNOSIS — Z3481 Encounter for supervision of other normal pregnancy, first trimester: Secondary | ICD-10-CM

## 2013-08-08 LAB — POCT URINALYSIS DIPSTICK
Blood, UA: NEGATIVE
Glucose, UA: NEGATIVE
Ketones, UA: NEGATIVE
Leukocytes, UA: NEGATIVE
Nitrite, UA: NEGATIVE

## 2013-08-08 NOTE — Progress Notes (Signed)
U/S(12+3wks)-active fetus, CRL c/w dates, cx long and closed, bilateral adnexa wnl, NB present, NT-1.57mm, Sub-Chorionic Hem=22 x 9mm (decreased in size)

## 2013-08-08 NOTE — Progress Notes (Signed)
Pt here today for routine visit and IT NT [place]. Pt denies any problems or concerns at this time.

## 2013-08-08 NOTE — Patient Instructions (Signed)
Pregnancy - Second Trimester The second trimester of pregnancy (3 to 6 months) is a period of rapid growth for you and your baby. At the end of the sixth month, your baby is about 9 inches long and weighs 1 1/2 pounds. You will begin to feel the baby move between 18 and 20 weeks of the pregnancy. This is called quickening. Weight gain is faster. A clear fluid (colostrum) may leak out of your breasts. You may feel small contractions of the womb (uterus). This is known as false labor or Braxton-Hicks contractions. This is like a practice for labor when the baby is ready to be born. Usually, the problems with morning sickness have usually passed by the end of your first trimester. Some women develop small dark blotches (called cholasma, mask of pregnancy) on their face that usually goes away after the baby is born. Exposure to the sun makes the blotches worse. Acne may also develop in some pregnant women and pregnant women who have acne, may find that it goes away. PRENATAL EXAMS  Blood work may continue to be done during prenatal exams. These tests are done to check on your health and the probable health of your baby. Blood work is used to follow your blood levels (hemoglobin). Anemia (low hemoglobin) is common during pregnancy. Iron and vitamins are given to help prevent this. You will also be checked for diabetes between 24 and 28 weeks of the pregnancy. Some of the previous blood tests may be repeated.  The size of the uterus is measured during each visit. This is to make sure that the baby is continuing to grow properly according to the dates of the pregnancy.  Your blood pressure is checked every prenatal visit. This is to make sure you are not getting toxemia.  Your urine is checked to make sure you do not have an infection, diabetes or protein in the urine.  Your weight is checked often to make sure gains are happening at the suggested rate. This is to ensure that both you and your baby are growing  normally.  Sometimes, an ultrasound is performed to confirm the proper growth and development of the baby. This is a test which bounces harmless sound waves off the baby so your caregiver can more accurately determine due dates. Sometimes, a test is done on the amniotic fluid surrounding the baby. This test is called an amniocentesis. The amniotic fluid is obtained by sticking a needle into the belly (abdomen). This is done to check the chromosomes in instances where there is a concern about possible genetic problems with the baby. It is also sometimes done near the end of pregnancy if an early delivery is required. In this case, it is done to help make sure the baby's lungs are mature enough for the baby to live outside of the womb. CHANGES OCCURING IN THE SECOND TRIMESTER OF PREGNANCY Your body goes through many changes during pregnancy. They vary from person to person. Talk to your caregiver about changes you notice that you are concerned about.  During the second trimester, you will likely have an increase in your appetite. It is normal to have cravings for certain foods. This varies from person to person and pregnancy to pregnancy.  Your lower abdomen will begin to bulge.  You may have to urinate more often because the uterus and baby are pressing on your bladder. It is also common to get more bladder infections during pregnancy. You can help this by drinking lots of fluids  and emptying your bladder before and after intercourse.  You may begin to get stretch marks on your hips, abdomen, and breasts. These are normal changes in the body during pregnancy. There are no exercises or medicines to take that prevent this change.  You may begin to develop swollen and bulging veins (varicose veins) in your legs. Wearing support hose, elevating your feet for 15 minutes, 3 to 4 times a day and limiting salt in your diet helps lessen the problem.  Heartburn may develop as the uterus grows and pushes up  against the stomach. Antacids recommended by your caregiver helps with this problem. Also, eating smaller meals 4 to 5 times a day helps.  Constipation can be treated with a stool softener or adding bulk to your diet. Drinking lots of fluids, and eating vegetables, fruits, and whole grains are helpful.  Exercising is also helpful. If you have been very active up until your pregnancy, most of these activities can be continued during your pregnancy. If you have been less active, it is helpful to start an exercise program such as walking.  Hemorrhoids may develop at the end of the second trimester. Warm sitz baths and hemorrhoid cream recommended by your caregiver helps hemorrhoid problems.  Backaches may develop during this time of your pregnancy. Avoid heavy lifting, wear low heal shoes, and practice good posture to help with backache problems.  Some pregnant women develop tingling and numbness of their hand and fingers because of swelling and tightening of ligaments in the wrist (carpel tunnel syndrome). This goes away after the baby is born.  As your breasts enlarge, you may have to get a bigger bra. Get a comfortable, cotton, support bra. Do not get a nursing bra until the last month of the pregnancy if you will be nursing the baby.  You may get a dark line from your belly button to the pubic area called the linea nigra.  You may develop rosy cheeks because of increase blood flow to the face.  You may develop spider looking lines of the face, neck, arms, and chest. These go away after the baby is born. HOME CARE INSTRUCTIONS   It is extremely important to avoid all smoking, herbs, alcohol, and unprescribed drugs during your pregnancy. These chemicals affect the formation and growth of the baby. Avoid these chemicals throughout the pregnancy to ensure the delivery of a healthy infant.  Most of your home care instructions are the same as suggested for the first trimester of your pregnancy.  Keep your caregiver's appointments. Follow your caregiver's instructions regarding medicine use, exercise, and diet.  During pregnancy, you are providing food for you and your baby. Continue to eat regular, well-balanced meals. Choose foods such as meat, fish, milk and other low fat dairy products, vegetables, fruits, and whole-grain breads and cereals. Your caregiver will tell you of the ideal weight gain.  A physical sexual relationship may be continued up until near the end of pregnancy if there are no other problems. Problems could include early (premature) leaking of amniotic fluid from the membranes, vaginal bleeding, abdominal pain, or other medical or pregnancy problems.  Exercise regularly if there are no restrictions. Check with your caregiver if you are unsure of the safety of some of your exercises. The greatest weight gain will occur in the last 2 trimesters of pregnancy. Exercise will help you:  Control your weight.  Get you in shape for labor and delivery.  Lose weight after you have the baby.  Wear  a good support or jogging bra for breast tenderness during pregnancy. This may help if worn during sleep. Pads or tissues may be used in the bra if you are leaking colostrum.  Do not use hot tubs, steam rooms or saunas throughout the pregnancy.  Wear your seat belt at all times when driving. This protects you and your baby if you are in an accident.  Avoid raw meat, uncooked cheese, cat litter boxes, and soil used by cats. These carry germs that can cause birth defects in the baby.  The second trimester is also a good time to visit your dentist for your dental health if this has not been done yet. Getting your teeth cleaned is okay. Use a soft toothbrush. Brush gently during pregnancy.  It is easier to leak urine during pregnancy. Tightening up and strengthening the pelvic muscles will help with this problem. Practice stopping your urination while you are going to the bathroom.  These are the same muscles you need to strengthen. It is also the muscles you would use as if you were trying to stop from passing gas. You can practice tightening these muscles up 10 times a set and repeating this about 3 times per day. Once you know what muscles to tighten up, do not perform these exercises during urination. It is more likely to contribute to an infection by backing up the urine.  Ask for help if you have financial, counseling, or nutritional needs during pregnancy. Your caregiver will be able to offer counseling for these needs as well as refer you for other special needs.  Your skin may become oily. If so, wash your face with mild soap, use non-greasy moisturizer and oil or cream based makeup. MEDICINES AND DRUG USE IN PREGNANCY  Take prenatal vitamins as directed. The vitamin should contain 1 milligram of folic acid. Keep all vitamins out of reach of children. Only a couple vitamins or tablets containing iron may be fatal to a baby or young child when ingested.  Avoid use of all medicines, including herbs, over-the-counter medicines, not prescribed or suggested by your caregiver. Only take over-the-counter or prescription medicines for pain, discomfort, or fever as directed by your caregiver. Do not use aspirin.  Let your caregiver also know about herbs you may be using.  Alcohol is related to a number of birth defects. This includes fetal alcohol syndrome. All alcohol, in any form, should be avoided completely. Smoking will cause low birth rate and premature babies.  Street or illegal drugs are very harmful to the baby. They are absolutely forbidden. A baby born to an addicted mother will be addicted at birth. The baby will go through the same withdrawal an adult does. SEEK MEDICAL CARE IF:  You have any concerns or worries during your pregnancy. It is better to call with your questions if you feel they cannot wait, rather than worry about them. SEEK IMMEDIATE MEDICAL CARE  IF:   An unexplained oral temperature above 102 F (38.9 C) develops, or as your caregiver suggests.  You have leaking of fluid from the vagina (birth canal). If leaking membranes are suspected, take your temperature and tell your caregiver of this when you call.  There is vaginal spotting, bleeding, or passing clots. Tell your caregiver of the amount and how many pads are used. Light spotting in pregnancy is common, especially following intercourse.  You develop a bad smelling vaginal discharge with a change in the color from clear to white.  You continue to feel  sick to your stomach (nauseated) and have no relief from remedies suggested. You vomit blood or coffee ground-like materials.  You lose more than 2 pounds of weight or gain more than 2 pounds of weight over 1 week, or as suggested by your caregiver.  You notice swelling of your face, hands, feet, or legs.  You get exposed to [place] measles and have never had them.  You are exposed to fifth disease or chickenpox.  You develop belly (abdominal) pain. Round ligament discomfort is a common non-cancerous (benign) cause of abdominal pain in pregnancy. Your caregiver still must evaluate you.  You develop a bad headache that does not go away.  You develop fever, diarrhea, pain with urination, or shortness of breath.  You develop visual problems, blurry, or double vision.  You fall or are in a car accident or any kind of trauma.  There is mental or physical violence at home. Document Released: 09/30/2001 Document Revised: 06/30/2012 Document Reviewed: 04/04/2009 [facility] Patient Information 2014 [place], [place]. Return in 4 weeks for second IT

## 2013-08-08 NOTE — Progress Notes (Signed)
No bleeding no pain needs refill prenatal vitamins,will follow up in 4 weeks for second IT, fainted 2 weeks had not eaten, eat every 2 hours.

## 2013-08-12 LAB — MATERNAL SCREEN, INTEGRATED #1

## 2013-09-05 ENCOUNTER — Other Ambulatory Visit: Payer: Self-pay | Admitting: Women's Health

## 2013-09-05 ENCOUNTER — Encounter: Payer: Self-pay | Admitting: Women's Health

## 2013-09-05 ENCOUNTER — Ambulatory Visit (INDEPENDENT_AMBULATORY_CARE_PROVIDER_SITE_OTHER): Payer: Medicaid Other | Admitting: Women's Health

## 2013-09-05 VITALS — BP 90/60 | Wt 107.0 lb

## 2013-09-05 DIAGNOSIS — F1920 Other psychoactive substance dependence, uncomplicated: Secondary | ICD-10-CM

## 2013-09-05 DIAGNOSIS — Z1389 Encounter for screening for other disorder: Secondary | ICD-10-CM

## 2013-09-05 DIAGNOSIS — Z23 Encounter for immunization: Secondary | ICD-10-CM

## 2013-09-05 DIAGNOSIS — Z3482 Encounter for supervision of other normal pregnancy, second trimester: Secondary | ICD-10-CM

## 2013-09-05 DIAGNOSIS — F1290 Cannabis use, unspecified, uncomplicated: Secondary | ICD-10-CM

## 2013-09-05 DIAGNOSIS — Z331 Pregnant state, incidental: Secondary | ICD-10-CM

## 2013-09-05 DIAGNOSIS — O99019 Anemia complicating pregnancy, unspecified trimester: Secondary | ICD-10-CM

## 2013-09-05 LAB — POCT URINALYSIS DIPSTICK
Blood, UA: NEGATIVE
Ketones, UA: NEGATIVE
Protein, UA: NEGATIVE

## 2013-09-05 MED ORDER — INFLUENZA VAC SPLIT QUAD 0.5 ML IM SUSP
0.5000 mL | Freq: Once | INTRAMUSCULAR | Status: AC
  Administered 2013-09-05: 0.5 mL via INTRAMUSCULAR

## 2013-09-05 NOTE — Patient Instructions (Signed)
Second Trimester of Pregnancy The second trimester is from week 13 through week 28, months 4 through 6. The second trimester is often a time when you feel your best. Your body has also adjusted to being pregnant, and you begin to feel better physically. Usually, morning sickness has lessened or quit completely, you may have more energy, and you may have an increase in appetite. The second trimester is also a time when the fetus is growing rapidly. At the end of the sixth month, the fetus is about 9 inches long and weighs about 1 pounds. You will likely begin to feel the baby move (quickening) between 18 and 20 weeks of the pregnancy. BODY CHANGES Your body goes through many changes during pregnancy. The changes vary from woman to woman.   Your weight will continue to increase. You will notice your lower abdomen bulging out.  You may begin to get stretch marks on your hips, abdomen, and breasts.  You may develop headaches that can be relieved by medicines approved by your caregiver.  You may urinate more often because the fetus is pressing on your bladder.  You may develop or continue to have heartburn as a result of your pregnancy.  You may develop constipation because certain hormones are causing the muscles that push waste through your intestines to slow down.  You may develop hemorrhoids or swollen, bulging veins (varicose veins).  You may have back pain because of the weight gain and pregnancy hormones relaxing your joints between the bones in your pelvis and as a result of a shift in weight and the muscles that support your balance.  Your breasts will continue to grow and be tender.  Your gums may bleed and may be sensitive to brushing and flossing.  Dark spots or blotches (chloasma, mask of pregnancy) may develop on your face. This will likely fade after the baby is born.  A dark line from your belly button to the pubic area (linea nigra) may appear. This will likely fade after the  baby is born. WHAT TO EXPECT AT YOUR PRENATAL VISITS During a routine prenatal visit:  You will be weighed to make sure you and the fetus are growing normally.  Your blood pressure will be taken.  Your abdomen will be measured to track your baby's growth.  The fetal heartbeat will be listened to.  Any test results from the previous visit will be discussed. Your caregiver may ask you:  How you are feeling.  If you are feeling the baby move.  If you have had any abnormal symptoms, such as leaking fluid, bleeding, severe headaches, or abdominal cramping.  If you have any questions. Other tests that may be performed during your second trimester include:  Blood tests that check for:  Low iron levels (anemia).  Gestational diabetes (between 24 and 28 weeks).  Rh antibodies.  Urine tests to check for infections, diabetes, or protein in the urine.  An ultrasound to confirm the proper growth and development of the baby.  An amniocentesis to check for possible genetic problems.  Fetal screens for spina bifida and Down syndrome. HOME CARE INSTRUCTIONS   Avoid all smoking, herbs, alcohol, and unprescribed drugs. These chemicals affect the formation and growth of the baby.  Follow your caregiver's instructions regarding medicine use. There are medicines that are either safe or unsafe to take during pregnancy.  Exercise only as directed by your caregiver. Experiencing uterine cramps is a good sign to stop exercising.  Continue to eat regular,   healthy meals.  Wear a good support bra for breast tenderness.  Do not use hot tubs, steam rooms, or saunas.  Wear your seat belt at all times when driving.  Avoid raw meat, uncooked cheese, cat litter boxes, and soil used by cats. These carry germs that can cause birth defects in the baby.  Take your prenatal vitamins.  Try taking a stool softener (if your caregiver approves) if you develop constipation. Eat more high-fiber foods,  such as fresh vegetables or fruit and whole grains. Drink plenty of fluids to keep your urine clear or pale yellow.  Take warm sitz baths to soothe any pain or discomfort caused by hemorrhoids. Use hemorrhoid cream if your caregiver approves.  If you develop varicose veins, wear support hose. Elevate your feet for 15 minutes, 3 4 times a day. Limit salt in your diet.  Avoid heavy lifting, wear low heel shoes, and practice good posture.  Rest with your legs elevated if you have leg cramps or low back pain.  Visit your dentist if you have not gone yet during your pregnancy. Use a soft toothbrush to brush your teeth and be gentle when you floss.  A sexual relationship may be continued unless your caregiver directs you otherwise.  Continue to go to all your prenatal visits as directed by your caregiver. SEEK MEDICAL CARE IF:   You have dizziness.  You have mild pelvic cramps, pelvic pressure, or nagging pain in the abdominal area.  You have persistent nausea, vomiting, or diarrhea.  You have a bad smelling vaginal discharge.  You have pain with urination. SEEK IMMEDIATE MEDICAL CARE IF:   You have a fever.  You are leaking fluid from your vagina.  You have spotting or bleeding from your vagina.  You have severe abdominal cramping or pain.  You have rapid weight gain or loss.  You have shortness of breath with chest pain.  You notice sudden or extreme swelling of your face, hands, ankles, feet, or legs.  You have not felt your baby move in over an hour.  You have severe headaches that do not go away with medicine.  You have vision changes. Document Released: 09/30/2001 Document Revised: 06/08/2013 Document Reviewed: 12/07/2012 [employer] Patient Information 2014 [employer].  

## 2013-09-05 NOTE — Progress Notes (Signed)
Denies uc's, lof, vb, urinary frequency, urgency, hesitancy, or dysuria.  No complaints.  Reviewed warning s/s.  All questions answered. 2nd IT today. F/U in 4wks for anatomy u/s and visit.

## 2013-09-08 LAB — MATERNAL SCREEN, INTEGRATED #2
AFP MoM: 0.94
Age risk Down Syndrome: 1:1000 {titer}
Calculated Gestational Age: 16.1
Crown Rump Length: 57.4 mm
Estriol Mom: 0.75
Estriol, Free: 0.8 ng/mL
Inhibin A Dimeric: 161 pg/mL
Inhibin A MoM: 0.8
MSS Down Syndrome: <1:5000 {titer}
MSS Trisomy 18 Risk: <1:5000 {titer}
NT MoM: 0.97
Number of fetuses: 1
PAPP-A MoM: 0.32
PAPP-A: 380 ng/mL

## 2013-09-10 ENCOUNTER — Encounter: Payer: Self-pay | Admitting: Women's Health

## 2013-10-03 ENCOUNTER — Ambulatory Visit (INDEPENDENT_AMBULATORY_CARE_PROVIDER_SITE_OTHER): Payer: Medicaid Other

## 2013-10-03 ENCOUNTER — Encounter: Payer: Self-pay | Admitting: Women's Health

## 2013-10-03 ENCOUNTER — Ambulatory Visit (INDEPENDENT_AMBULATORY_CARE_PROVIDER_SITE_OTHER): Payer: Medicaid Other | Admitting: Women's Health

## 2013-10-03 ENCOUNTER — Other Ambulatory Visit: Payer: Self-pay | Admitting: Women's Health

## 2013-10-03 VITALS — BP 92/50 | Wt 108.0 lb

## 2013-10-03 DIAGNOSIS — Z331 Pregnant state, incidental: Secondary | ICD-10-CM

## 2013-10-03 DIAGNOSIS — Z1389 Encounter for screening for other disorder: Secondary | ICD-10-CM

## 2013-10-03 DIAGNOSIS — Z3482 Encounter for supervision of other normal pregnancy, second trimester: Secondary | ICD-10-CM

## 2013-10-03 DIAGNOSIS — F1920 Other psychoactive substance dependence, uncomplicated: Secondary | ICD-10-CM

## 2013-10-03 DIAGNOSIS — O99019 Anemia complicating pregnancy, unspecified trimester: Secondary | ICD-10-CM

## 2013-10-03 DIAGNOSIS — Z363 Encounter for antenatal screening for malformations: Secondary | ICD-10-CM

## 2013-10-03 LAB — POCT URINALYSIS DIPSTICK
Glucose, UA: NEGATIVE
Ketones, UA: NEGATIVE
Protein, UA: NEGATIVE

## 2013-10-03 NOTE — Progress Notes (Signed)
Reports good fm. Denies uc's, lof, vb, urinary frequency, urgency, hesitancy, or dysuria.  No complaints.  Reviewed ptl s/s, fm.  All questions answered. F/U in 4wks for visit.

## 2013-10-03 NOTE — Progress Notes (Signed)
U/S(20+3wks)-active fetus, meas c/w dates, fluid wnl, posterior Gr 0 placenta, cx long and closed (3.3cm), bilateral adnexa WNL, no major abnl noted, female fetus, FHR-154 bpm

## 2013-10-03 NOTE — Patient Instructions (Signed)
Second Trimester of Pregnancy The second trimester is from week 13 through week 28, months 4 through 6. The second trimester is often a time when you feel your best. Your body has also adjusted to being pregnant, and you begin to feel better physically. Usually, morning sickness has lessened or quit completely, you may have more energy, and you may have an increase in appetite. The second trimester is also a time when the fetus is growing rapidly. At the end of the sixth month, the fetus is about 9 inches long and weighs about 1 pounds. You will likely begin to feel the baby move (quickening) between 18 and 20 weeks of the pregnancy. BODY CHANGES Your body goes through many changes during pregnancy. The changes vary from woman to woman.   Your weight will continue to increase. You will notice your lower abdomen bulging out.  You may begin to get stretch marks on your hips, abdomen, and breasts.  You may develop headaches that can be relieved by medicines approved by your caregiver.  You may urinate more often because the fetus is pressing on your bladder.  You may develop or continue to have heartburn as a result of your pregnancy.  You may develop constipation because certain hormones are causing the muscles that push waste through your intestines to slow down.  You may develop hemorrhoids or swollen, bulging veins (varicose veins).  You may have back pain because of the weight gain and pregnancy hormones relaxing your joints between the bones in your pelvis and as a result of a shift in weight and the muscles that support your balance.  Your breasts will continue to grow and be tender.  Your gums may bleed and may be sensitive to brushing and flossing.  Dark spots or blotches (chloasma, mask of pregnancy) may develop on your face. This will likely fade after the baby is born.  A dark line from your belly button to the pubic area (linea nigra) may appear. This will likely fade after the  baby is born. WHAT TO EXPECT AT YOUR PRENATAL VISITS During a routine prenatal visit:  You will be weighed to make sure you and the fetus are growing normally.  Your blood pressure will be taken.  Your abdomen will be measured to track your baby's growth.  The fetal heartbeat will be listened to.  Any test results from the previous visit will be discussed. Your caregiver may ask you:  How you are feeling.  If you are feeling the baby move.  If you have had any abnormal symptoms, such as leaking fluid, bleeding, severe headaches, or abdominal cramping.  If you have any questions. Other tests that may be performed during your second trimester include:  Blood tests that check for:  Low iron levels (anemia).  Gestational diabetes (between 24 and 28 weeks).  Rh antibodies.  Urine tests to check for infections, diabetes, or protein in the urine.  An ultrasound to confirm the proper growth and development of the baby.  An amniocentesis to check for possible genetic problems.  Fetal screens for spina bifida and Down syndrome. HOME CARE INSTRUCTIONS   Avoid all smoking, herbs, alcohol, and unprescribed drugs. These chemicals affect the formation and growth of the baby.  Follow your caregiver's instructions regarding medicine use. There are medicines that are either safe or unsafe to take during pregnancy.  Exercise only as directed by your caregiver. Experiencing uterine cramps is a good sign to stop exercising.  Continue to eat regular,   healthy meals.  Wear a good support bra for breast tenderness.  Do not use hot tubs, steam rooms, or saunas.  Wear your seat belt at all times when driving.  Avoid raw meat, uncooked cheese, cat litter boxes, and soil used by cats. These carry germs that can cause birth defects in the baby.  Take your prenatal vitamins.  Try taking a stool softener (if your caregiver approves) if you develop constipation. Eat more high-fiber foods,  such as fresh vegetables or fruit and whole grains. Drink plenty of fluids to keep your urine clear or pale yellow.  Take warm sitz baths to soothe any pain or discomfort caused by hemorrhoids. Use hemorrhoid cream if your caregiver approves.  If you develop varicose veins, wear support hose. Elevate your feet for 15 minutes, 3 4 times a day. Limit salt in your diet.  Avoid heavy lifting, wear low heel shoes, and practice good posture.  Rest with your legs elevated if you have leg cramps or low back pain.  Visit your dentist if you have not gone yet during your pregnancy. Use a soft toothbrush to brush your teeth and be gentle when you floss.  A sexual relationship may be continued unless your caregiver directs you otherwise.  Continue to go to all your prenatal visits as directed by your caregiver. SEEK MEDICAL CARE IF:   You have dizziness.  You have mild pelvic cramps, pelvic pressure, or nagging pain in the abdominal area.  You have persistent nausea, vomiting, or diarrhea.  You have a bad smelling vaginal discharge.  You have pain with urination. SEEK IMMEDIATE MEDICAL CARE IF:   You have a fever.  You are leaking fluid from your vagina.  You have spotting or bleeding from your vagina.  You have severe abdominal cramping or pain.  You have rapid weight gain or loss.  You have shortness of breath with chest pain.  You notice sudden or extreme swelling of your face, hands, ankles, feet, or legs.  You have not felt your baby move in over an hour.  You have severe headaches that do not go away with medicine.  You have vision changes. Document Released: 09/30/2001 Document Revised: 06/08/2013 Document Reviewed: 12/07/2012 [employer] Patient Information 2014 [employer].  

## 2013-10-20 NOTE — L&D Delivery Note (Signed)
Delivery Note At 10:25 AM a viable female was delivered via Vaginal, Spontaneous Delivery (Presentation: ;  ).  APGAR: crying at perineum, ; weight .   Placenta status: , Spontaneous.  Cord: 3 vessels with the following complications: .  Cord pH: NA  Anesthesia: Epidural  Episiotomy:  Lacerations: None Suture Repair: NA Est. Blood Loss (mL): 450  Mom to postpartum.  Baby to Couplet care / Skin to Skin  Called to delivery of infant prior to arriving to room. Mother pushed over intact perineum. Infant delivered to maternal abdomen. Cord clamped and cut by nurse. Active management of 3rd stage with traction delivered intact palcenta with extra lobe. Placenta delivered intact with 3v cord. Heavy bleeding following resolved with pit and emptying bladder. [id number]. Counts correct. Hemostatic.   Fredrik Rigger, MD OB Fellow

## 2013-10-31 ENCOUNTER — Ambulatory Visit (INDEPENDENT_AMBULATORY_CARE_PROVIDER_SITE_OTHER): Payer: Medicaid Other | Admitting: Obstetrics & Gynecology

## 2013-10-31 ENCOUNTER — Encounter (INDEPENDENT_AMBULATORY_CARE_PROVIDER_SITE_OTHER): Payer: Self-pay

## 2013-10-31 ENCOUNTER — Encounter: Payer: Self-pay | Admitting: Obstetrics & Gynecology

## 2013-10-31 VITALS — BP 90/62 | Wt 110.5 lb

## 2013-10-31 DIAGNOSIS — O9989 Other specified diseases and conditions complicating pregnancy, childbirth and the puerperium: Secondary | ICD-10-CM

## 2013-10-31 DIAGNOSIS — F1920 Other psychoactive substance dependence, uncomplicated: Secondary | ICD-10-CM

## 2013-10-31 DIAGNOSIS — O99019 Anemia complicating pregnancy, unspecified trimester: Secondary | ICD-10-CM

## 2013-10-31 DIAGNOSIS — Z331 Pregnant state, incidental: Secondary | ICD-10-CM

## 2013-10-31 DIAGNOSIS — O99891 Other specified diseases and conditions complicating pregnancy: Secondary | ICD-10-CM

## 2013-10-31 DIAGNOSIS — Z1389 Encounter for screening for other disorder: Secondary | ICD-10-CM

## 2013-10-31 DIAGNOSIS — O09299 Supervision of pregnancy with other poor reproductive or obstetric history, unspecified trimester: Secondary | ICD-10-CM

## 2013-10-31 DIAGNOSIS — O99320 Drug use complicating pregnancy, unspecified trimester: Secondary | ICD-10-CM

## 2013-10-31 LAB — POCT URINALYSIS DIPSTICK
GLUCOSE UA: NEGATIVE
Ketones, UA: NEGATIVE
NITRITE UA: NEGATIVE
RBC UA: NEGATIVE

## 2013-10-31 NOTE — Progress Notes (Signed)
BP weight and urine results all reviewed and noted. Patient reports good fetal movement, denies any bleeding and no rupture of membranes symptoms or regular contractions. Patient is without complaints. All questions were answered.  

## 2013-11-28 ENCOUNTER — Ambulatory Visit (INDEPENDENT_AMBULATORY_CARE_PROVIDER_SITE_OTHER): Payer: Medicaid Other | Admitting: Obstetrics and Gynecology

## 2013-11-28 ENCOUNTER — Encounter: Payer: Self-pay | Admitting: Obstetrics and Gynecology

## 2013-11-28 ENCOUNTER — Other Ambulatory Visit: Payer: Medicaid Other

## 2013-11-28 VITALS — BP 90/48 | Wt 113.8 lb

## 2013-11-28 DIAGNOSIS — O9989 Other specified diseases and conditions complicating pregnancy, childbirth and the puerperium: Secondary | ICD-10-CM

## 2013-11-28 DIAGNOSIS — O99891 Other specified diseases and conditions complicating pregnancy: Secondary | ICD-10-CM

## 2013-11-28 DIAGNOSIS — O99019 Anemia complicating pregnancy, unspecified trimester: Secondary | ICD-10-CM

## 2013-11-28 DIAGNOSIS — Z1389 Encounter for screening for other disorder: Secondary | ICD-10-CM

## 2013-11-28 DIAGNOSIS — F1920 Other psychoactive substance dependence, uncomplicated: Secondary | ICD-10-CM

## 2013-11-28 DIAGNOSIS — O99320 Drug use complicating pregnancy, unspecified trimester: Secondary | ICD-10-CM

## 2013-11-28 DIAGNOSIS — Z3480 Encounter for supervision of other normal pregnancy, unspecified trimester: Secondary | ICD-10-CM

## 2013-11-28 DIAGNOSIS — O09299 Supervision of pregnancy with other poor reproductive or obstetric history, unspecified trimester: Secondary | ICD-10-CM

## 2013-11-28 DIAGNOSIS — Z331 Pregnant state, incidental: Secondary | ICD-10-CM

## 2013-11-28 LAB — POCT URINALYSIS DIPSTICK
Blood, UA: NEGATIVE
Glucose, UA: NEGATIVE
Ketones, UA: NEGATIVE
Nitrite, UA: NEGATIVE
PROTEIN UA: NEGATIVE

## 2013-11-28 LAB — CBC
HCT: 33.3 % — ABNORMAL LOW (ref 36.0–46.0)
Hemoglobin: 11.1 g/dL — ABNORMAL LOW (ref 12.0–15.0)
MCH: 30.2 pg (ref 26.0–34.0)
MCHC: 33.3 g/dL (ref 30.0–36.0)
MCV: 90.5 fL (ref 78.0–100.0)
Platelets: 214 10*3/uL (ref 150–400)
RBC: 3.68 MIL/uL — ABNORMAL LOW (ref 3.87–5.11)
RDW: 13.1 % (ref 11.5–15.5)
WBC: 6.4 10*3/uL (ref 4.0–10.5)

## 2013-11-28 LAB — RPR

## 2013-11-28 LAB — HIV ANTIBODY (ROUTINE TESTING W REFLEX): HIV: NONREACTIVE

## 2013-11-28 NOTE — Progress Notes (Signed)
[redacted]w[redacted]d. She denies any concerns at this time. She denies having any contractions, vaginal bleeding or discharge. PN-2 labs today.Pt to pursue Breast feeding classes. Previously attended childbirth classes first pregnancy Routine f/u 4wk

## 2013-11-29 ENCOUNTER — Encounter: Payer: Self-pay | Admitting: Obstetrics and Gynecology

## 2013-11-29 LAB — ANTIBODY SCREEN: Antibody Screen: NEGATIVE

## 2013-11-29 LAB — HSV 2 ANTIBODY, IGG: HSV 2 Glycoprotein G Ab, IgG: <0.1 IV

## 2013-11-29 LAB — GLUCOSE TOLERANCE, 2 HOURS W/ 1HR
GLUCOSE, 2 HOUR: 106 mg/dL (ref 70–139)
Glucose, 1 hour: 111 mg/dL (ref 70–170)
Glucose, Fasting: 73 mg/dL (ref 70–99)

## 2013-12-26 ENCOUNTER — Telehealth: Payer: Self-pay | Admitting: *Deleted

## 2013-12-26 ENCOUNTER — Ambulatory Visit (INDEPENDENT_AMBULATORY_CARE_PROVIDER_SITE_OTHER): Payer: Medicaid Other | Admitting: Obstetrics and Gynecology

## 2013-12-26 ENCOUNTER — Encounter (INDEPENDENT_AMBULATORY_CARE_PROVIDER_SITE_OTHER): Payer: Self-pay

## 2013-12-26 VITALS — BP 98/44 | Wt 120.4 lb

## 2013-12-26 DIAGNOSIS — O09299 Supervision of pregnancy with other poor reproductive or obstetric history, unspecified trimester: Secondary | ICD-10-CM

## 2013-12-26 DIAGNOSIS — F1920 Other psychoactive substance dependence, uncomplicated: Secondary | ICD-10-CM

## 2013-12-26 DIAGNOSIS — Z1389 Encounter for screening for other disorder: Secondary | ICD-10-CM

## 2013-12-26 DIAGNOSIS — O9989 Other specified diseases and conditions complicating pregnancy, childbirth and the puerperium: Secondary | ICD-10-CM

## 2013-12-26 DIAGNOSIS — O99320 Drug use complicating pregnancy, unspecified trimester: Secondary | ICD-10-CM

## 2013-12-26 DIAGNOSIS — Z331 Pregnant state, incidental: Secondary | ICD-10-CM | POA: Insufficient documentation

## 2013-12-26 DIAGNOSIS — O99019 Anemia complicating pregnancy, unspecified trimester: Secondary | ICD-10-CM

## 2013-12-26 DIAGNOSIS — O99891 Other specified diseases and conditions complicating pregnancy: Secondary | ICD-10-CM

## 2013-12-26 LAB — POCT URINALYSIS DIPSTICK
Blood, UA: NEGATIVE
GLUCOSE UA: NEGATIVE
KETONES UA: NEGATIVE
NITRITE UA: NEGATIVE
Protein, UA: NEGATIVE

## 2013-12-26 NOTE — Telephone Encounter (Signed)
Informed patient that her urine was thrown out before I saw the order in for the urine culture.  Patient states she is still in town and will stop by here and give [place] another specimen.

## 2013-12-26 NOTE — Patient Instructions (Signed)
Call if no results of urine culture on MyChart in  5 days.

## 2013-12-26 NOTE — Progress Notes (Signed)
[redacted]w[redacted]d female presents for routine prenatal visit. She denies any concerns at this time. She denies dysuria, vaginal discharge or bleeding. Size slightly lagging, but feels appropriate to pt and consistent with prior pregnancies. 4 cm growth/4wk U/a + for wbc, will send C&S

## 2014-01-09 ENCOUNTER — Encounter: Payer: Self-pay | Admitting: Women's Health

## 2014-01-09 ENCOUNTER — Ambulatory Visit (INDEPENDENT_AMBULATORY_CARE_PROVIDER_SITE_OTHER): Payer: Medicaid Other | Admitting: Women's Health

## 2014-01-09 VITALS — BP 100/50 | Wt 122.6 lb

## 2014-01-09 DIAGNOSIS — O9989 Other specified diseases and conditions complicating pregnancy, childbirth and the puerperium: Secondary | ICD-10-CM

## 2014-01-09 DIAGNOSIS — O99019 Anemia complicating pregnancy, unspecified trimester: Secondary | ICD-10-CM

## 2014-01-09 DIAGNOSIS — Z331 Pregnant state, incidental: Secondary | ICD-10-CM

## 2014-01-09 DIAGNOSIS — F1290 Cannabis use, unspecified, uncomplicated: Secondary | ICD-10-CM

## 2014-01-09 DIAGNOSIS — Z3480 Encounter for supervision of other normal pregnancy, unspecified trimester: Secondary | ICD-10-CM

## 2014-01-09 DIAGNOSIS — O99891 Other specified diseases and conditions complicating pregnancy: Secondary | ICD-10-CM

## 2014-01-09 DIAGNOSIS — Z1389 Encounter for screening for other disorder: Secondary | ICD-10-CM

## 2014-01-09 DIAGNOSIS — O09299 Supervision of pregnancy with other poor reproductive or obstetric history, unspecified trimester: Secondary | ICD-10-CM

## 2014-01-09 LAB — POCT URINALYSIS DIPSTICK
Blood, UA: NEGATIVE
GLUCOSE UA: NEGATIVE
Ketones, UA: NEGATIVE
Leukocytes, UA: NEGATIVE
NITRITE UA: NEGATIVE
Protein, UA: NEGATIVE

## 2014-01-09 NOTE — Patient Instructions (Signed)
Third Trimester of Pregnancy The third trimester is from week 29 through week 42, months 7 through 9. The third trimester is a time when the fetus is growing rapidly. At the end of the ninth month, the fetus is about 20 inches in length and weighs 6 10 pounds.  BODY CHANGES Your body goes through many changes during pregnancy. The changes vary from woman to woman.   Your weight will continue to increase. You can expect to gain 25 35 pounds (11 16 kg) by the end of the pregnancy.  You may begin to get stretch marks on your hips, abdomen, and breasts.  You may urinate more often because the fetus is moving lower into your pelvis and pressing on your bladder.  You may develop or continue to have heartburn as a result of your pregnancy.  You may develop constipation because certain hormones are causing the muscles that push waste through your intestines to slow down.  You may develop hemorrhoids or swollen, bulging veins (varicose veins).  You may have pelvic pain because of the weight gain and pregnancy hormones relaxing your joints between the bones in your pelvis. Back aches may result from over exertion of the muscles supporting your posture.  Your breasts will continue to grow and be tender. A yellow discharge may leak from your breasts called colostrum.  Your belly button may stick out.  You may feel short of breath because of your expanding uterus.  You may notice the fetus "dropping," or moving lower in your abdomen.  You may have a bloody mucus discharge. This usually occurs a few days to a week before labor begins.  Your cervix becomes thin and soft (effaced) near your due date. WHAT TO EXPECT AT YOUR PRENATAL EXAMS  You will have prenatal exams every 2 weeks until week 36. Then, you will have weekly prenatal exams. During a routine prenatal visit:  You will be weighed to make sure you and the fetus are growing normally.  Your blood pressure is taken.  Your abdomen will be  measured to track your baby's growth.  The fetal heartbeat will be listened to.  Any test results from the previous visit will be discussed.  You may have a cervical check near your due date to see if you have effaced. At around 36 weeks, your caregiver will check your cervix. At the same time, your caregiver will also perform a test on the secretions of the vaginal tissue. This test is to determine if a type of bacteria, Group B streptococcus, is present. Your caregiver will explain this further. Your caregiver may ask you:  What your birth plan is.  How you are feeling.  If you are feeling the baby move.  If you have had any abnormal symptoms, such as leaking fluid, bleeding, severe headaches, or abdominal cramping.  If you have any questions. Other tests or screenings that may be performed during your third trimester include:  Blood tests that check for low iron levels (anemia).  Fetal testing to check the health, activity level, and growth of the fetus. Testing is done if you have certain medical conditions or if there are problems during the pregnancy. FALSE LABOR You may feel small, irregular contractions that eventually go away. These are called Braxton Hicks contractions, or false labor. Contractions may last for hours, days, or even weeks before true labor sets in. If contractions come at regular intervals, intensify, or become painful, it is best to be seen by your caregiver.  SIGNS OF LABOR   Menstrual-like cramps.  Contractions that are 5 minutes apart or less.  Contractions that start on the top of the uterus and spread down to the lower abdomen and back.  A sense of increased pelvic pressure or back pain.  A watery or bloody mucus discharge that comes from the vagina. If you have any of these signs before the 37th week of pregnancy, call your caregiver right away. You need to go to the hospital to get checked immediately. HOME CARE INSTRUCTIONS   Avoid all  smoking, herbs, alcohol, and unprescribed drugs. These chemicals affect the formation and growth of the baby.  Follow your caregiver's instructions regarding medicine use. There are medicines that are either safe or unsafe to take during pregnancy.  Exercise only as directed by your caregiver. Experiencing uterine cramps is a good sign to stop exercising.  Continue to eat regular, healthy meals.  Wear a good support bra for breast tenderness.  Do not use hot tubs, steam rooms, or saunas.  Wear your seat belt at all times when driving.  Avoid raw meat, uncooked cheese, cat litter boxes, and soil used by cats. These carry germs that can cause birth defects in the baby.  Take your prenatal vitamins.  Try taking a stool softener (if your caregiver approves) if you develop constipation. Eat more high-fiber foods, such as fresh vegetables or fruit and whole grains. Drink plenty of fluids to keep your urine clear or pale yellow.  Take warm sitz baths to soothe any pain or discomfort caused by hemorrhoids. Use hemorrhoid cream if your caregiver approves.  If you develop varicose veins, wear support hose. Elevate your feet for 15 minutes, 3 4 times a day. Limit salt in your diet.  Avoid heavy lifting, wear low heal shoes, and practice good posture.  Rest a lot with your legs elevated if you have leg cramps or low back pain.  Visit your dentist if you have not gone during your pregnancy. Use a soft toothbrush to brush your teeth and be gentle when you floss.  A sexual relationship may be continued unless your caregiver directs you otherwise.  Do not travel far distances unless it is absolutely necessary and only with the approval of your caregiver.  Take prenatal classes to understand, practice, and ask questions about the labor and delivery.  Make a trial run to the hospital.  Pack your hospital bag.  Prepare the baby's nursery.  Continue to go to all your prenatal visits as directed  by your caregiver. SEEK MEDICAL CARE IF:  You are unsure if you are in labor or if your water has broken.  You have dizziness.  You have mild pelvic cramps, pelvic pressure, or nagging pain in your abdominal area.  You have persistent nausea, vomiting, or diarrhea.  You have a bad smelling vaginal discharge.  You have pain with urination. SEEK IMMEDIATE MEDICAL CARE IF:   You have a fever.  You are leaking fluid from your vagina.  You have spotting or bleeding from your vagina.  You have severe abdominal cramping or pain.  You have rapid weight loss or gain.  You have shortness of breath with chest pain.  You notice sudden or extreme swelling of your face, hands, ankles, feet, or legs.  You have not felt your baby move in over an hour.  You have severe headaches that do not go away with medicine.  You have vision changes. Document Released: 09/30/2001 Document Revised: 06/08/2013 Document Reviewed:   You have severe abdominal cramping or pain.   You have rapid weight loss or gain.   You have shortness of breath with chest pain.   You notice sudden or extreme swelling of your face, hands, ankles, feet, or legs.   You have not felt your baby move in over an hour.   You have severe headaches that do not go away with medicine.   You have vision changes.  Document Released: 09/30/2001 Document Revised: 06/08/2013 Document Reviewed: 12/07/2012  [employer] Patient Information 2014 [employer].

## 2014-01-09 NOTE — Progress Notes (Signed)
Reports good fm. Denies uc's, lof, vb, uti s/s.  No complaints.  Reviewed ptl s/s, fkc.  All questions answered. F/U in 2wks for visit.

## 2014-01-10 ENCOUNTER — Encounter: Payer: Self-pay | Admitting: Women's Health

## 2014-01-10 LAB — DRUG SCREEN, URINE, NO CONFIRMATION
Amphetamine Screen, Ur: NEGATIVE
BENZODIAZEPINES.: NEGATIVE
Barbiturate Quant, Ur: NEGATIVE
Cocaine Metabolites: NEGATIVE
Creatinine,U: 90.7 mg/dL
Marijuana Metabolite: NEGATIVE
Methadone: NEGATIVE
Opiate Screen, Urine: NEGATIVE
PHENCYCLIDINE (PCP): NEGATIVE
PROPOXYPHENE: NEGATIVE

## 2014-01-23 ENCOUNTER — Encounter: Payer: Self-pay | Admitting: Obstetrics and Gynecology

## 2014-01-23 ENCOUNTER — Ambulatory Visit (INDEPENDENT_AMBULATORY_CARE_PROVIDER_SITE_OTHER): Payer: Medicaid Other | Admitting: Obstetrics and Gynecology

## 2014-01-23 VITALS — BP 102/62 | Wt 126.8 lb

## 2014-01-23 DIAGNOSIS — O9989 Other specified diseases and conditions complicating pregnancy, childbirth and the puerperium: Secondary | ICD-10-CM

## 2014-01-23 DIAGNOSIS — Z3480 Encounter for supervision of other normal pregnancy, unspecified trimester: Secondary | ICD-10-CM

## 2014-01-23 DIAGNOSIS — O99891 Other specified diseases and conditions complicating pregnancy: Secondary | ICD-10-CM

## 2014-01-23 DIAGNOSIS — Z1389 Encounter for screening for other disorder: Secondary | ICD-10-CM

## 2014-01-23 DIAGNOSIS — O09299 Supervision of pregnancy with other poor reproductive or obstetric history, unspecified trimester: Secondary | ICD-10-CM

## 2014-01-23 DIAGNOSIS — O99019 Anemia complicating pregnancy, unspecified trimester: Secondary | ICD-10-CM

## 2014-01-23 DIAGNOSIS — Z331 Pregnant state, incidental: Secondary | ICD-10-CM

## 2014-01-23 LAB — POCT URINALYSIS DIPSTICK
GLUCOSE UA: NEGATIVE
Ketones, UA: NEGATIVE
Leukocytes, UA: NEGATIVE
Nitrite, UA: NEGATIVE
Protein, UA: NEGATIVE
RBC UA: NEGATIVE

## 2014-01-23 LAB — OB RESULTS CONSOLE GC/CHLAMYDIA
Chlamydia: NEGATIVE
GC PROBE AMP, GENITAL: NEGATIVE

## 2014-01-23 NOTE — Progress Notes (Signed)
Good FM. No srom or bleeding.  FOB just left for [place] for 3 wk job. Will ck cx q visit GBS/gc/chl collected

## 2014-01-24 LAB — GC/CHLAMYDIA PROBE AMP
CT Probe RNA: NEGATIVE
GC Probe RNA: NEGATIVE

## 2014-01-25 LAB — STREP B DNA PROBE: GBSP: NEGATIVE

## 2014-01-29 ENCOUNTER — Encounter: Payer: Self-pay | Admitting: Obstetrics and Gynecology

## 2014-01-30 ENCOUNTER — Ambulatory Visit (INDEPENDENT_AMBULATORY_CARE_PROVIDER_SITE_OTHER): Payer: Medicaid Other | Admitting: Obstetrics and Gynecology

## 2014-01-30 VITALS — BP 94/60 | Wt 127.0 lb

## 2014-01-30 DIAGNOSIS — Z1389 Encounter for screening for other disorder: Secondary | ICD-10-CM

## 2014-01-30 DIAGNOSIS — O99891 Other specified diseases and conditions complicating pregnancy: Secondary | ICD-10-CM

## 2014-01-30 DIAGNOSIS — O09299 Supervision of pregnancy with other poor reproductive or obstetric history, unspecified trimester: Secondary | ICD-10-CM

## 2014-01-30 DIAGNOSIS — Z331 Pregnant state, incidental: Secondary | ICD-10-CM

## 2014-01-30 DIAGNOSIS — O9989 Other specified diseases and conditions complicating pregnancy, childbirth and the puerperium: Secondary | ICD-10-CM

## 2014-01-30 DIAGNOSIS — O99019 Anemia complicating pregnancy, unspecified trimester: Secondary | ICD-10-CM

## 2014-01-30 LAB — POCT URINALYSIS DIPSTICK
GLUCOSE UA: NEGATIVE
Ketones, UA: NEGATIVE
Leukocytes, UA: NEGATIVE
Nitrite, UA: NEGATIVE
Protein, UA: NEGATIVE
RBC UA: NEGATIVE

## 2014-01-30 NOTE — Progress Notes (Signed)
Good FM, no bleeding or srom. Size clinically feels appropriate, vtx feels ballottable.

## 2014-02-06 ENCOUNTER — Encounter: Payer: Self-pay | Admitting: Obstetrics and Gynecology

## 2014-02-06 ENCOUNTER — Ambulatory Visit (INDEPENDENT_AMBULATORY_CARE_PROVIDER_SITE_OTHER): Payer: Medicaid Other | Admitting: Obstetrics and Gynecology

## 2014-02-06 VITALS — BP 100/64 | Wt 130.2 lb

## 2014-02-06 DIAGNOSIS — Z1389 Encounter for screening for other disorder: Secondary | ICD-10-CM

## 2014-02-06 DIAGNOSIS — Z331 Pregnant state, incidental: Secondary | ICD-10-CM

## 2014-02-06 DIAGNOSIS — O9989 Other specified diseases and conditions complicating pregnancy, childbirth and the puerperium: Secondary | ICD-10-CM

## 2014-02-06 DIAGNOSIS — O99019 Anemia complicating pregnancy, unspecified trimester: Secondary | ICD-10-CM

## 2014-02-06 DIAGNOSIS — Z3480 Encounter for supervision of other normal pregnancy, unspecified trimester: Secondary | ICD-10-CM

## 2014-02-06 DIAGNOSIS — O09299 Supervision of pregnancy with other poor reproductive or obstetric history, unspecified trimester: Secondary | ICD-10-CM

## 2014-02-06 DIAGNOSIS — O99891 Other specified diseases and conditions complicating pregnancy: Secondary | ICD-10-CM

## 2014-02-06 LAB — POCT URINALYSIS DIPSTICK
GLUCOSE UA: NEGATIVE
KETONES UA: NEGATIVE
Nitrite, UA: NEGATIVE
PROTEIN UA: NEGATIVE
RBC UA: NEGATIVE

## 2014-02-06 NOTE — Progress Notes (Signed)
[redacted]w[redacted]d female presents for routine prenatal visit today. She reports lower back pain and occasional contractions without pattern. She denies vaginal bleeding or discharge.    Has transportation

## 2014-02-09 ENCOUNTER — Encounter (HOSPITAL_COMMUNITY): Payer: Self-pay | Admitting: *Deleted

## 2014-02-09 ENCOUNTER — Inpatient Hospital Stay (HOSPITAL_COMMUNITY)
Admission: AD | Admit: 2014-02-09 | Discharge: 2014-02-09 | Disposition: A | Discharge status: Home / Self Care | Payer: Medicaid Other | Source: Ambulatory Visit | Attending: Obstetrics & Gynecology | Admitting: Obstetrics & Gynecology

## 2014-02-09 DIAGNOSIS — O479 False labor, unspecified: Secondary | ICD-10-CM | POA: Insufficient documentation

## 2014-02-09 DIAGNOSIS — Z3480 Encounter for supervision of other normal pregnancy, unspecified trimester: Secondary | ICD-10-CM

## 2014-02-09 NOTE — Discharge Instructions (Signed)

## 2014-02-09 NOTE — MAU Note (Signed)
Patient states she is having irregular contractions. States she was 5 cm on 4-20. Denies bleeding but has a mucus discharge. Reports good fetal movement.

## 2014-02-10 ENCOUNTER — Inpatient Hospital Stay (HOSPITAL_COMMUNITY): Payer: Medicaid Other | Admitting: Anesthesiology

## 2014-02-10 ENCOUNTER — Inpatient Hospital Stay (HOSPITAL_COMMUNITY)
Admission: AD | Admit: 2014-02-10 | Discharge: 2014-02-11 | DRG: 774 | Disposition: A | Discharge status: Home / Self Care | Payer: Medicaid Other | Source: Ambulatory Visit | Attending: Obstetrics & Gynecology | Admitting: Obstetrics & Gynecology

## 2014-02-10 ENCOUNTER — Encounter (HOSPITAL_COMMUNITY): Payer: Self-pay | Admitting: *Deleted

## 2014-02-10 ENCOUNTER — Encounter (HOSPITAL_COMMUNITY): Payer: Medicaid Other | Admitting: Anesthesiology

## 2014-02-10 DIAGNOSIS — Z8249 Family history of ischemic heart disease and other diseases of the circulatory system: Secondary | ICD-10-CM

## 2014-02-10 DIAGNOSIS — F1210 Cannabis abuse, uncomplicated: Secondary | ICD-10-CM | POA: Diagnosis present

## 2014-02-10 DIAGNOSIS — Z3480 Encounter for supervision of other normal pregnancy, unspecified trimester: Secondary | ICD-10-CM

## 2014-02-10 DIAGNOSIS — O99344 Other mental disorders complicating childbirth: Secondary | ICD-10-CM

## 2014-02-10 DIAGNOSIS — Z833 Family history of diabetes mellitus: Secondary | ICD-10-CM

## 2014-02-10 DIAGNOSIS — IMO0001 Reserved for inherently not codable concepts without codable children: Secondary | ICD-10-CM

## 2014-02-10 LAB — RAPID URINE DRUG SCREEN, HOSP PERFORMED
AMPHETAMINES: NOT DETECTED
Barbiturates: NOT DETECTED
Benzodiazepines: NOT DETECTED
Cocaine: NOT DETECTED
OPIATES: NOT DETECTED
Tetrahydrocannabinol: NOT DETECTED

## 2014-02-10 LAB — CBC
HCT: 33.9 % — ABNORMAL LOW (ref 36.0–46.0)
HEMATOCRIT: 29.4 % — AB (ref 36.0–46.0)
HEMOGLOBIN: 9.8 g/dL — AB (ref 12.0–15.0)
Hemoglobin: 11.6 g/dL — ABNORMAL LOW (ref 12.0–15.0)
MCH: 30.9 pg (ref 26.0–34.0)
MCH: 30.2 pg (ref 26.0–34.0)
MCHC: 34.2 g/dL (ref 30.0–36.0)
MCHC: 33.3 g/dL (ref 30.0–36.0)
MCV: 90.4 fL (ref 78.0–100.0)
MCV: 90.7 fL (ref 78.0–100.0)
PLATELETS: 147 10*3/uL — AB (ref 150–400)
Platelets: 174 10*3/uL (ref 150–400)
RBC: 3.75 MIL/uL — ABNORMAL LOW (ref 3.87–5.11)
RBC: 3.24 MIL/uL — AB (ref 3.87–5.11)
RDW: 12.9 % (ref 11.5–15.5)
RDW: 12.9 % (ref 11.5–15.5)
WBC: 7.7 10*3/uL (ref 4.0–10.5)
WBC: 11.9 10*3/uL — AB (ref 4.0–10.5)

## 2014-02-10 LAB — TYPE AND SCREEN
ABO/RH(D): O POS
Antibody Screen: NEGATIVE

## 2014-02-10 LAB — RPR

## 2014-02-10 MED ORDER — OXYCODONE-ACETAMINOPHEN 5-325 MG PO TABS
1.0000 | ORAL_TABLET | ORAL | Status: DC | PRN
  Administered 2014-02-11: 1 via ORAL
  Filled 2014-02-10: qty 1

## 2014-02-10 MED ORDER — DIBUCAINE 1 % RE OINT: 1.0000 "application " | TOPICAL_OINTMENT | RECTAL | Status: DC | PRN

## 2014-02-10 MED ORDER — DIPHENHYDRAMINE HCL 50 MG/ML IJ SOLN: 12.5000 mg | INTRAMUSCULAR | Status: DC | PRN

## 2014-02-10 MED ORDER — CITRIC ACID-SODIUM CITRATE 334-500 MG/5ML PO SOLN: 30.0000 mL | ORAL | Status: DC | PRN

## 2014-02-10 MED ORDER — DIPHENHYDRAMINE HCL 25 MG PO CAPS: 25.0000 mg | ORAL_CAPSULE | Freq: Four times a day (QID) | ORAL | Status: DC | PRN

## 2014-02-10 MED ORDER — ONDANSETRON HCL 4 MG PO TABS: 4.0000 mg | ORAL_TABLET | ORAL | Status: DC | PRN

## 2014-02-10 MED ORDER — SODIUM CHLORIDE 0.9 % IJ SOLN
3.0000 mL | Freq: Three times a day (TID) | INTRAMUSCULAR | Status: DC
  Administered 2014-02-10 – 2014-02-11 (×2): 3 mL via INTRAVENOUS

## 2014-02-10 MED ORDER — METHYLERGONOVINE MALEATE 0.2 MG PO TABS
0.2000 mg | ORAL_TABLET | ORAL | Status: AC
  Administered 2014-02-10 – 2014-02-11 (×5): 0.2 mg via ORAL
  Filled 2014-02-10 (×6): qty 1

## 2014-02-10 MED ORDER — LACTATED RINGERS IV SOLN
INTRAVENOUS | Status: DC
  Administered 2014-02-10: 09:00:00 via INTRAVENOUS

## 2014-02-10 MED ORDER — METHYLERGONOVINE MALEATE 0.2 MG PO TABS: 0.2000 mg | ORAL_TABLET | ORAL | Status: DC | PRN

## 2014-02-10 MED ORDER — PHENYLEPHRINE 40 MCG/ML (10ML) SYRINGE FOR IV PUSH (FOR BLOOD PRESSURE SUPPORT)
PREFILLED_SYRINGE | INTRAVENOUS | Status: AC
  Filled 2014-02-10: qty 10

## 2014-02-10 MED ORDER — METHYLERGONOVINE MALEATE 0.2 MG/ML IJ SOLN
INTRAMUSCULAR | Status: AC
  Filled 2014-02-10: qty 1

## 2014-02-10 MED ORDER — LIDOCAINE HCL (PF) 1 % IJ SOLN
30.0000 mL | INTRAMUSCULAR | Status: DC | PRN
  Filled 2014-02-10: qty 30

## 2014-02-10 MED ORDER — MISOPROSTOL 200 MCG PO TABS
800.0000 ug | ORAL_TABLET | Freq: Once | ORAL | Status: AC
  Administered 2014-02-10: 800 ug via VAGINAL

## 2014-02-10 MED ORDER — EPHEDRINE 5 MG/ML INJ
10.0000 mg | INTRAVENOUS | Status: DC | PRN
  Filled 2014-02-10: qty 2

## 2014-02-10 MED ORDER — PRENATAL MULTIVITAMIN CH
1.0000 | ORAL_TABLET | Freq: Every day | ORAL | Status: DC
  Administered 2014-02-11: 1 via ORAL
  Filled 2014-02-10: qty 1

## 2014-02-10 MED ORDER — PHENYLEPHRINE 40 MCG/ML (10ML) SYRINGE FOR IV PUSH (FOR BLOOD PRESSURE SUPPORT)
80.0000 ug | PREFILLED_SYRINGE | INTRAVENOUS | Status: DC | PRN
  Filled 2014-02-10: qty 2

## 2014-02-10 MED ORDER — ACETAMINOPHEN 325 MG PO TABS: 650.0000 mg | ORAL_TABLET | ORAL | Status: DC | PRN

## 2014-02-10 MED ORDER — OXYCODONE-ACETAMINOPHEN 5-325 MG PO TABS: 1.0000 | ORAL_TABLET | ORAL | Status: DC | PRN

## 2014-02-10 MED ORDER — FENTANYL 2.5 MCG/ML BUPIVACAINE 1/10 % EPIDURAL INFUSION (WH - ANES): 14.0000 mL/h | INTRAMUSCULAR | Status: DC | PRN

## 2014-02-10 MED ORDER — MISOPROSTOL 200 MCG PO TABS
ORAL_TABLET | ORAL | Status: AC
  Filled 2014-02-10: qty 3

## 2014-02-10 MED ORDER — ONDANSETRON HCL 4 MG/2ML IJ SOLN: 4.0000 mg | INTRAMUSCULAR | Status: DC | PRN

## 2014-02-10 MED ORDER — SENNOSIDES-DOCUSATE SODIUM 8.6-50 MG PO TABS
2.0000 | ORAL_TABLET | ORAL | Status: DC
  Administered 2014-02-11: 2 via ORAL
  Filled 2014-02-10: qty 2

## 2014-02-10 MED ORDER — MISOPROSTOL 200 MCG PO TABS
ORAL_TABLET | ORAL | Status: AC
  Filled 2014-02-10: qty 1

## 2014-02-10 MED ORDER — WITCH HAZEL-GLYCERIN EX PADS: 1.0000 "application " | MEDICATED_PAD | CUTANEOUS | Status: DC | PRN

## 2014-02-10 MED ORDER — OXYTOCIN 40 UNITS IN LACTATED RINGERS INFUSION - SIMPLE MED
62.5000 mL/h | INTRAVENOUS | Status: DC
Start: 2014-02-10 — End: 2014-02-10
  Administered 2014-02-10: 62.5 mL/h via INTRAVENOUS
  Filled 2014-02-10: qty 1000

## 2014-02-10 MED ORDER — LACTATED RINGERS IV SOLN: 500.0000 mL | INTRAVENOUS | Status: DC | PRN

## 2014-02-10 MED ORDER — IBUPROFEN 600 MG PO TABS: 600.0000 mg | ORAL_TABLET | Freq: Four times a day (QID) | ORAL | Status: DC | PRN

## 2014-02-10 MED ORDER — TETANUS-DIPHTH-ACELL PERTUSSIS 5-2.5-18.5 LF-MCG/0.5 IM SUSP: 0.5000 mL | Freq: Once | INTRAMUSCULAR | Status: DC

## 2014-02-10 MED ORDER — FENTANYL 2.5 MCG/ML BUPIVACAINE 1/10 % EPIDURAL INFUSION (WH - ANES)
INTRAMUSCULAR | Status: AC
  Filled 2014-02-10: qty 125

## 2014-02-10 MED ORDER — BENZOCAINE-MENTHOL 20-0.5 % EX AERO: 1.0000 "application " | INHALATION_SPRAY | CUTANEOUS | Status: DC | PRN

## 2014-02-10 MED ORDER — EPHEDRINE 5 MG/ML INJ
INTRAVENOUS | Status: AC
  Filled 2014-02-10: qty 4

## 2014-02-10 MED ORDER — METHYLERGONOVINE MALEATE 0.2 MG/ML IJ SOLN
0.2000 mg | Freq: Once | INTRAMUSCULAR | Status: AC
  Administered 2014-02-10: 0.2 mg via INTRAMUSCULAR

## 2014-02-10 MED ORDER — FENTANYL CITRATE 0.05 MG/ML IJ SOLN: 100.0000 ug | INTRAMUSCULAR | Status: DC | PRN

## 2014-02-10 MED ORDER — ZOLPIDEM TARTRATE 5 MG PO TABS: 5.0000 mg | ORAL_TABLET | Freq: Every evening | ORAL | Status: DC | PRN

## 2014-02-10 MED ORDER — METHYLERGONOVINE MALEATE 0.2 MG/ML IJ SOLN: 0.2000 mg | INTRAMUSCULAR | Status: DC | PRN

## 2014-02-10 MED ORDER — METHYLERGONOVINE MALEATE 0.2 MG/ML IJ SOLN
0.2000 mg | INTRAMUSCULAR | Status: AC
  Administered 2014-02-10: 0.2 mg via INTRAMUSCULAR

## 2014-02-10 MED ORDER — SIMETHICONE 80 MG PO CHEW: 80.0000 mg | CHEWABLE_TABLET | ORAL | Status: DC | PRN

## 2014-02-10 MED ORDER — LACTATED RINGERS IV SOLN: 500.0000 mL | Freq: Once | INTRAVENOUS | Status: DC

## 2014-02-10 MED ORDER — OXYTOCIN BOLUS FROM INFUSION: 500.0000 mL | INTRAVENOUS | Status: DC

## 2014-02-10 MED ORDER — LACTATED RINGERS IV BOLUS (SEPSIS): 500.0000 mL | Freq: Once | INTRAVENOUS | Status: DC

## 2014-02-10 MED ORDER — ONDANSETRON HCL 4 MG/2ML IJ SOLN: 4.0000 mg | Freq: Four times a day (QID) | INTRAMUSCULAR | Status: DC | PRN

## 2014-02-10 MED ORDER — LANOLIN HYDROUS EX OINT: TOPICAL_OINTMENT | CUTANEOUS | Status: DC | PRN

## 2014-02-10 MED ORDER — MISOPROSTOL 200 MCG PO TABS
ORAL_TABLET | ORAL | Status: AC
  Filled 2014-02-10: qty 4

## 2014-02-10 MED ORDER — LIDOCAINE HCL (PF) 1 % IJ SOLN
INTRAMUSCULAR | Status: DC | PRN
  Administered 2014-02-10 (×2): 4 mL

## 2014-02-10 MED ORDER — IBUPROFEN 600 MG PO TABS
600.0000 mg | ORAL_TABLET | Freq: Four times a day (QID) | ORAL | Status: DC
  Administered 2014-02-10 – 2014-02-11 (×4): 600 mg via ORAL
  Filled 2014-02-10 (×4): qty 1

## 2014-02-10 NOTE — Progress Notes (Addendum)
S: Called to evaluate for PP bleeding. Appears to be approx 700cc in blood clots.  Interventions  - pit running and rebolused - I/O 200cc, now placing indwelling cath - cytotec 832mcg PR placed - starting methergine course for 24hr  Filed Vitals:   02/10/14 1115  BP: 109/69  Pulse: 71  Temp:   Resp:    VSS NAD Swept large amount of clot   A/P: PPH Stage 1, interventions as above Continue current care.  If continues to bleed will place bachri balloon.  Fredrik Rigger, MD OB Fellow  2:47 PM Pt reevaluated, significant improvement in bleeding on most recent check. OK to transfer to PP. Will continue to closely follow. CBC in AM.   Fredrik Rigger, MD OB Fellow

## 2014-02-10 NOTE — MAU Note (Signed)
Pt states she was in MAU last night, was 5.5 cm's, sent home.  uc's continue this a.m., pt states she had ? Gush of fluid @ 0700, also some bloody show.

## 2014-02-10 NOTE — Anesthesia Procedure Notes (Signed)
Epidural Patient location during procedure: OB Start time: 02/10/2014 10:24 AM  Staffing Anesthesiologist: Jone Panebianco A. Performed by: anesthesiologist   Preanesthetic Checklist Completed: patient identified, site marked, surgical consent, pre-op evaluation, timeout performed, IV checked, risks and benefits discussed and monitors and equipment checked  Epidural Patient position: sitting Prep: site prepped and draped and DuraPrep Patient monitoring: continuous pulse ox and blood pressure Approach: midline Location: L3-L4 Injection technique: LOR air  Needle:  Needle type: Tuohy  Needle gauge: 17 G Needle length: 9 cm and 9 Needle insertion depth: 4 cm Catheter type: closed end flexible Catheter size: 19 Gauge Catheter at skin depth: 9 cm Test dose: negative and Other  Assessment Events: blood not aspirated, injection not painful, no injection resistance, negative IV test and no paresthesia  Additional Notes Patient identified. Risks and benefits discussed including failed block, incomplete  Pain control, post dural puncture headache, nerve damage, paralysis, blood pressure Changes, nausea, vomiting, reactions to medications-both toxic and allergic and post Partum back pain. All questions were answered. Patient expressed understanding and wished to proceed. Sterile technique was used throughout procedure. Epidural site was Dressed with sterile barrier dressing. No paresthesias, signs of intravascular injection Or signs of intrathecal spread were encountered.  Patient was more comfortable after the epidural was dosed. Please see RN's note for documentation of vital signs and FHR which are stable.

## 2014-02-10 NOTE — Anesthesia Preprocedure Evaluation (Signed)
Anesthesia Evaluation  Patient identified by MRN, date of birth, ID band Patient awake    Reviewed: Allergy & Precautions, H&P , Patient's Chart, lab work & pertinent test results  Airway Mallampati: II TM Distance: >3 FB Neck ROM: Full    Dental no notable dental hx. (+) Teeth Intact   Pulmonary neg pulmonary ROS,  breath sounds clear to auscultation  Pulmonary exam normal       Cardiovascular negative cardio ROS  Rhythm:Regular Rate:Normal     Neuro/Psych negative neurological ROS  negative psych ROS   GI/Hepatic negative GI ROS, (+)     substance abuse  marijuana use,   Endo/Other  negative endocrine ROS  Renal/GU negative Renal ROS  negative genitourinary   Musculoskeletal negative musculoskeletal ROS (+)   Abdominal   Peds  Hematology negative hematology ROS (+)   Anesthesia Other Findings   Reproductive/Obstetrics (+) Pregnancy                           Anesthesia Physical Anesthesia Plan  ASA: II  Anesthesia Plan: Epidural   Post-op Pain Management:    Induction:   Airway Management Planned: Natural Airway  Additional Equipment:   Intra-op Plan:   Post-operative Plan:   Informed Consent: I have reviewed the patients History and Physical, chart, labs and discussed the procedure including the risks, benefits and alternatives for the proposed anesthesia with the patient or authorized representative who has indicated his/her understanding and acceptance.     Plan Discussed with: Anesthesiologist  Anesthesia Plan Comments:         Anesthesia Quick Evaluation

## 2014-02-10 NOTE — Plan of Care (Signed)
Problem: Phase I Progression Outcomes Goal: Foley catheter patent Outcome: Completed/Met Date Met:  02/10/14 Foley to be left in 2/24 until MD d'cs in am 4/25 due to [facility] Goal: OOB as tolerated unless otherwise ordered Outcome: Progressing Not to ambulate tonight until CBC in am due to [facility]

## 2014-02-10 NOTE — Progress Notes (Signed)
IV saline locked as no order for running fluids post partum, there was no saline flush ordered md will be called for this as protocol requires.
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
## 2014-02-11 ENCOUNTER — Ambulatory Visit: Payer: Self-pay

## 2014-02-11 LAB — CBC
HEMATOCRIT: 24.9 % — AB (ref 36.0–46.0)
Hemoglobin: 8.2 g/dL — ABNORMAL LOW (ref 12.0–15.0)
MCH: 29.9 pg (ref 26.0–34.0)
MCHC: 32.9 g/dL (ref 30.0–36.0)
MCV: 90.9 fL (ref 78.0–100.0)
Platelets: 129 10*3/uL — ABNORMAL LOW (ref 150–400)
RBC: 2.74 MIL/uL — AB (ref 3.87–5.11)
RDW: 13 % (ref 11.5–15.5)
WBC: 8 10*3/uL (ref 4.0–10.5)

## 2014-02-11 MED ORDER — IBUPROFEN 600 MG PO TABS: 600.0000 mg | ORAL_TABLET | Freq: Four times a day (QID) | ORAL | Status: DC

## 2014-02-11 NOTE — H&P (Signed)
Attestation of Attending Supervision of Obstetric Fellow: Evaluation and management procedures were performed by the Obstetric Fellow under my supervision and collaboration.  I have reviewed the Obstetric Fellow's note and chart, and I agree with the management and plan.  Hridaan Bouse, MD, FACOG Attending Obstetrician & Gynecologist Faculty Practice, [facility]   

## 2014-02-11 NOTE — Discharge Instructions (Signed)

## 2014-02-11 NOTE — Progress Notes (Signed)
Clinical Social Work Department PSYCHOSOCIAL ASSESSMENT - MATERNAL/CHILD 02/11/2014  Patient:  Judy Lang, Judy Lang  Account Number:  [id number]  Admit Date:  02/10/2014  Ardine Eng Name:   Judy Lang    Clinical Social Worker:  Elizar Alpern, LCSW   Date/Time:  02/11/2014 03:00 AM  Date Referred:  02/10/2014   Referral source  Central Nursery     Referred reason  Hx of Substance Abuse   Other referral source:    I:  FAMILY / HOME ENVIRONMENT Child's legal guardian:  PARENT  Guardian - Name Guardian - Age Guardian - Address  Judy Lang, Judy Lang [address] [place], [place] [address]  Oneal Grout 24 same as above   Other household support members/support persons Other support:    II  PSYCHOSOCIAL DATA Information Source:    Occupational hygienist Employment:   FOB is employed   Museum/gallery curator resources:  [employer] [place]:   Other  Ken Caryl / Grade:   Maternity Care Coordinator / Child Services Coordination / Early Interventions:  Cultural issues impacting care:    III  STRENGTHS Strengths  Supportive family/friends  Home prepared for Child (including basic supplies)  Adequate Resources   Strength comment:    IV  RISK FACTORS AND CURRENT PROBLEMS Current Problem:       V  SOCIAL WORK ASSESSMENT Acknowledged Social Work consult to assess mother's history of marijuana use.  Mother was receptive to social work intervention.   She is a single parent with one other dependent age 65.  FOB is reportedly supportive.  Informed that he has been in [place] working since Jan.  Parents reside together paternal grandmother.   Mother admits to use of marijuana during the beginning of the pregnancy.   She denies any other illicit drug use or need for treatment.  She was informed of the hospital's newborn drug screen policy.  UDS on newborn was negative.   Mother also reports no hx of mental illness.  Informed her of CSW availability.    VI  SOCIAL WORK PLAN Social Work Plan  No Further Intervention Required / No Barriers to Discharge   Type of pt/family education:   If child protective services report - county:   If child protective services report - date:   Information/referral to community resources comment:   Other social work plan:   Will continue to monitor drug screen

## 2014-02-11 NOTE — Discharge Summary (Signed)
Obstetric Discharge Summary Reason for Admission: onset of labor Prenatal Procedures: none Intrapartum Procedures: spontaneous vaginal delivery- precipitous Postpartum Procedures: none Complications-Operative and Postpartum: none Hemoglobin  Date Value Ref Range Status  02/11/2014 8.2* 12.0 - 15.0 g/dL Final     HCT  Date Value Ref Range Status  02/11/2014 24.9* 36.0 - 46.0 % Final   HGB 9.8 prior to delivery  Judy Lang is a 27yo G3P1011 at 39.0wks presenting in active labor on the morning of 02/10/14. She was admitted to L&D and quickly progressed to SVD with RN in attendance. Once placenta delivered she had some brisk bleeding which resolved with Pitocin and bladder-emptying. By PPD#1 she is doing well and is deemed to have received the full benefit of her hospital stay. She will have a SW consult prior to d/c due to testing + for MJ during the preg (UDS neg this admission). She is breastfeeding and desires Nexplanon for contraception.  Physical Exam:  General: alert, cooperative and no distress Heart: RRR Lungs: nl effort Lochia: appropriate Uterine Fundus: firm DVT Evaluation: No evidence of DVT seen on physical exam.  Discharge Diagnoses: Term Pregnancy-delivered  Discharge Information: Date: 02/11/2014 Activity: pelvic rest Diet: routine Medications: PNV and Ibuprofen Condition: stable Instructions: refer to practice specific booklet Discharge to: home Follow-up Information   Follow up with [facility]. Schedule an appointment as soon as possible for a visit in 4 weeks. (For your postpartum appointment.)    Contact information:   [place] [id number] [phone]      Newborn Data: Live born female  Birth Weight: 6 lb 7 oz (2920 g) APGAR: 9, 9  Home with mother.  Judy Lang CNM 02/11/2014, 9:00 AM

## 2014-02-11 NOTE — Lactation Note (Signed)
This note was copied from the chart of [place]. Lactation Consultation Note  [name] [id number] Date: 02/11/2014 Reason for consult: Follow-up assessment Follow-up assessment of latch, baby 31 hours of life. Mom reports sore nipples, esp left nipple. States that she had issues with left nipple with first child. Mom able to latch baby deeply. Enc mom to wait until baby has open gape and to tug chin to flange lower lip after baby latches if needed. Baby had numerous bursts of rhythmic sucking and swallowing. Mom reports more comfort with left breast during this feed. Mom given comfort gels with instruction. Mom enc to call out for assistance as needed.   Maternal Data    Feeding Feeding Type: Breast Fed Length of feed:  (LC assessed first 15 minutes of BF.)  LATCH Score/Interventions    Audible Swallowing: Spontaneous and intermittent  Type of Nipple: Everted at rest and after stimulation  Comfort (Breast/Nipple): Filling, red/small blisters or bruises, mild/mod discomfort  Problem noted: Mild/Moderate discomfort (Needed a deeper latch, was having problems with soreness esp left nipple.)  Hold (Positioning): No assistance needed to correctly position infant at breast. Intervention(s): Breastfeeding basics reviewed;Support Pillows     Lactation Tools Discussed/Used     Consult Status Consult Status: Follow-up Follow-up type: In-patient    Judy Lang 02/11/2014, 5:41 PM

## 2014-02-11 NOTE — Anesthesia Postprocedure Evaluation (Signed)
  Anesthesia Post-op Note  Anesthesia Post Note  Patient: Judy Lang  Procedure(s) Performed: * No procedures listed *  Anesthesia type: Epidural  Patient location: Mother/Baby  Post pain: Pain level controlled  Post assessment: Post-op Vital signs reviewed  Last Vitals:  Filed Vitals:   02/11/14 0619  BP: 99/58  Pulse: 70  Temp: 36.9 C  Resp: 17    Post vital signs: Reviewed  Level of consciousness:alert  Complications: No apparent anesthesia complications

## 2014-02-12 ENCOUNTER — Ambulatory Visit: Payer: Self-pay

## 2014-02-12 NOTE — Lactation Note (Signed)
This note was copied from the chart of [place]. Lactation Consultation Note Reviewed engorgement care, pacifier use, supply and demand. Mom encouraged to feed baby 8-12 times/24 hours and with feeding cues.  Encouraged mother to call if further assistance is needed.  [name] [id number] Date: 02/12/2014 Reason for consult: Follow-up assessment   Maternal Data Formula Feeding for Exclusion: Yes Reason for exclusion: Mother's choice to formula and breast feed on admission  Feeding Feeding Type: Formula  LATCH Score/Interventions                      Lactation Tools Discussed/Used     Consult Status Consult Status: Complete    Carlye Grippe 02/12/2014, 11:19 AM

## 2014-02-13 ENCOUNTER — Encounter: Payer: Medicaid Other | Admitting: Obstetrics and Gynecology

## 2014-02-13 NOTE — Progress Notes (Signed)
Post discharge review completed. 

## 2014-02-16 ENCOUNTER — Telehealth: Payer: Self-pay | Admitting: *Deleted

## 2014-02-16 NOTE — Telephone Encounter (Signed)
Pt informed to continue PNV and add iron OTC. Pt verbalized understanding and states has her postpartum appt scheduled in 4 weeks.

## 2014-02-16 NOTE — Telephone Encounter (Signed)
Ema from the [facility] Department states pt Hgb today 8.3, pt taking PNV. Pt delivered 02/10/2014, SVD.

## 2014-03-01 ENCOUNTER — Inpatient Hospital Stay (HOSPITAL_COMMUNITY): Payer: Medicaid Other

## 2014-03-01 ENCOUNTER — Ambulatory Visit (HOSPITAL_COMMUNITY)
Admission: AD | Admit: 2014-03-01 | Discharge: 2014-03-02 | Disposition: A | Discharge status: Home / Self Care | Payer: Medicaid Other | Source: Ambulatory Visit | Attending: Obstetrics & Gynecology | Admitting: Obstetrics & Gynecology

## 2014-03-01 ENCOUNTER — Encounter (HOSPITAL_COMMUNITY): Payer: Self-pay | Admitting: Anesthesiology

## 2014-03-01 ENCOUNTER — Ambulatory Visit (INDEPENDENT_AMBULATORY_CARE_PROVIDER_SITE_OTHER): Payer: Medicaid Other | Admitting: Women's Health

## 2014-03-01 ENCOUNTER — Encounter: Payer: Self-pay | Admitting: Women's Health

## 2014-03-01 ENCOUNTER — Encounter (HOSPITAL_COMMUNITY): Payer: Medicaid Other | Admitting: Anesthesiology

## 2014-03-01 ENCOUNTER — Encounter (HOSPITAL_COMMUNITY): Payer: Self-pay | Admitting: *Deleted

## 2014-03-01 ENCOUNTER — Inpatient Hospital Stay (HOSPITAL_COMMUNITY): Payer: Medicaid Other | Admitting: Anesthesiology

## 2014-03-01 ENCOUNTER — Encounter (HOSPITAL_COMMUNITY)
Admission: AD | Disposition: A | Discharge status: Home / Self Care | Payer: Self-pay | Source: Ambulatory Visit | Attending: Obstetrics & Gynecology

## 2014-03-01 DIAGNOSIS — D649 Anemia, unspecified: Secondary | ICD-10-CM | POA: Diagnosis not present

## 2014-03-01 DIAGNOSIS — O9081 Anemia of the puerperium: Secondary | ICD-10-CM | POA: Insufficient documentation

## 2014-03-01 HISTORY — PX: DILATION AND EVACUATION: SHX1459

## 2014-03-01 LAB — CBC
HCT: 20.4 % — ABNORMAL LOW (ref 36.0–46.0)
Hemoglobin: 6.3 g/dL — CL (ref 12.0–15.0)
MCH: 28.4 pg (ref 26.0–34.0)
MCHC: 30.9 g/dL (ref 30.0–36.0)
MCV: 91.9 fL (ref 78.0–100.0)
PLATELETS: 254 10*3/uL (ref 150–400)
RBC: 2.22 MIL/uL — ABNORMAL LOW (ref 3.87–5.11)
RDW: 14 % (ref 11.5–15.5)
WBC: 9.6 10*3/uL (ref 4.0–10.5)

## 2014-03-01 LAB — PROTIME-INR
INR: 1.23 (ref 0.00–1.49)
Prothrombin Time: 15.2 seconds (ref 11.6–15.2)

## 2014-03-01 LAB — PREPARE RBC (CROSSMATCH)

## 2014-03-01 LAB — APTT: aPTT: 26 seconds (ref 24–37)

## 2014-03-01 LAB — FIBRINOGEN: Fibrinogen: 246 mg/dL (ref 204–475)

## 2014-03-01 SURGERY — DILATION AND EVACUATION, UTERUS
Anesthesia: Spinal | Site: Vagina

## 2014-03-01 MED ORDER — CITRIC ACID-SODIUM CITRATE 334-500 MG/5ML PO SOLN
30.0000 mL | Freq: Once | ORAL | Status: AC
  Administered 2014-03-01: 30 mL via ORAL
  Filled 2014-03-01: qty 15

## 2014-03-01 MED ORDER — LACTATED RINGERS IV SOLN
INTRAVENOUS | Status: DC | PRN
  Administered 2014-03-01: 22:00:00 via INTRAVENOUS

## 2014-03-01 MED ORDER — LIDOCAINE HCL (CARDIAC) 20 MG/ML IV SOLN
INTRAVENOUS | Status: AC
  Filled 2014-03-01: qty 5

## 2014-03-01 MED ORDER — OXYCODONE HCL 5 MG PO TABS: 5.0000 mg | ORAL_TABLET | Freq: Once | ORAL | Status: AC | PRN

## 2014-03-01 MED ORDER — OXYTOCIN 40 UNITS IN LACTATED RINGERS INFUSION - SIMPLE MED
250.0000 mL/h | Freq: Once | INTRAVENOUS | Status: AC
  Administered 2014-03-01: 250 mL/h via INTRAVENOUS
  Administered 2014-03-01: 40 [IU] via INTRAVENOUS

## 2014-03-01 MED ORDER — OXYCODONE-ACETAMINOPHEN 5-325 MG PO TABS: 1.0000 | ORAL_TABLET | ORAL | Status: DC | PRN

## 2014-03-01 MED ORDER — METHYLERGONOVINE MALEATE 0.2 MG/ML IJ SOLN
INTRAMUSCULAR | Status: AC
  Filled 2014-03-01: qty 1

## 2014-03-01 MED ORDER — BUPIVACAINE HCL (PF) 0.75 % IJ SOLN
INTRAMUSCULAR | Status: DC | PRN
  Administered 2014-03-01: 1.2 mL via INTRATHECAL

## 2014-03-01 MED ORDER — LACTATED RINGERS IV SOLN
INTRAVENOUS | Status: DC | PRN
  Administered 2014-03-01 (×2): via INTRAVENOUS

## 2014-03-01 MED ORDER — OXYCODONE HCL 5 MG/5ML PO SOLN
5.0000 mg | Freq: Once | ORAL | Status: AC | PRN
Start: 2014-03-01 — End: 2014-03-01

## 2014-03-01 MED ORDER — ONDANSETRON HCL 4 MG/2ML IJ SOLN
INTRAMUSCULAR | Status: DC | PRN
  Administered 2014-03-01: 4 mg via INTRAVENOUS

## 2014-03-01 MED ORDER — ONDANSETRON HCL 4 MG/2ML IJ SOLN
INTRAMUSCULAR | Status: AC
  Filled 2014-03-01: qty 2

## 2014-03-01 MED ORDER — FENTANYL CITRATE 0.05 MG/ML IJ SOLN
INTRAMUSCULAR | Status: DC | PRN
  Administered 2014-03-01 (×2): 25 ug via INTRAVENOUS

## 2014-03-01 MED ORDER — SODIUM CHLORIDE 0.9 % IV SOLN
1000.0000 mL | Freq: Once | INTRAVENOUS | Status: AC
  Administered 2014-03-01: 1000 mL via INTRAVENOUS

## 2014-03-01 MED ORDER — IBUPROFEN 800 MG PO TABS: 800.0000 mg | ORAL_TABLET | Freq: Three times a day (TID) | ORAL | Status: DC

## 2014-03-01 MED ORDER — OXYTOCIN 10 UNIT/ML IJ SOLN
INTRAMUSCULAR | Status: AC
  Filled 2014-03-01: qty 4

## 2014-03-01 MED ORDER — DEXAMETHASONE SODIUM PHOSPHATE 10 MG/ML IJ SOLN
INTRAMUSCULAR | Status: DC | PRN
  Administered 2014-03-01: 10 mg via INTRAVENOUS

## 2014-03-01 MED ORDER — DEXAMETHASONE SODIUM PHOSPHATE 10 MG/ML IJ SOLN
INTRAMUSCULAR | Status: AC
  Filled 2014-03-01: qty 1

## 2014-03-01 MED ORDER — FENTANYL CITRATE 0.05 MG/ML IJ SOLN
INTRAMUSCULAR | Status: AC
  Filled 2014-03-01: qty 2

## 2014-03-01 MED ORDER — ACETAMINOPHEN 160 MG/5ML PO SOLN: 325.0000 mg | ORAL | Status: DC | PRN

## 2014-03-01 MED ORDER — ACETAMINOPHEN 325 MG PO TABS: 325.0000 mg | ORAL_TABLET | ORAL | Status: DC | PRN

## 2014-03-01 MED ORDER — MIDAZOLAM HCL 2 MG/2ML IJ SOLN
INTRAMUSCULAR | Status: AC
  Filled 2014-03-01: qty 2

## 2014-03-01 MED ORDER — ONDANSETRON HCL 4 MG/2ML IJ SOLN
4.0000 mg | Freq: Once | INTRAMUSCULAR | Status: AC | PRN
Start: 2014-03-01 — End: 2014-03-01

## 2014-03-01 MED ORDER — METHYLERGONOVINE MALEATE 0.2 MG/ML IJ SOLN
0.2000 mg | Freq: Once | INTRAMUSCULAR | Status: AC
  Administered 2014-03-01: 0.2 mg via INTRAMUSCULAR

## 2014-03-01 MED ORDER — PROPOFOL 10 MG/ML IV EMUL
INTRAVENOUS | Status: AC
  Filled 2014-03-01: qty 20

## 2014-03-01 MED ORDER — OXYTOCIN 40 UNITS IN LACTATED RINGERS INFUSION - SIMPLE MED
INTRAVENOUS | Status: AC
  Administered 2014-03-01: 40 [IU] via INTRAVENOUS
  Filled 2014-03-01: qty 1000

## 2014-03-01 MED ORDER — CEFAZOLIN SODIUM-DEXTROSE 2-3 GM-% IV SOLR
INTRAVENOUS | Status: DC | PRN
  Administered 2014-03-01: 2 g via INTRAVENOUS

## 2014-03-01 MED ORDER — FENTANYL CITRATE 0.05 MG/ML IJ SOLN: 25.0000 ug | INTRAMUSCULAR | Status: DC | PRN

## 2014-03-01 MED ORDER — MIDAZOLAM HCL 2 MG/2ML IJ SOLN
INTRAMUSCULAR | Status: DC | PRN
  Administered 2014-03-01 (×2): 0.5 mg via INTRAVENOUS

## 2014-03-01 MED ORDER — METHYLERGONOVINE MALEATE 0.2 MG/ML IJ SOLN
INTRAMUSCULAR | Status: AC
  Administered 2014-03-01: 22:00:00
  Filled 2014-03-01: qty 1

## 2014-03-01 SURGICAL SUPPLY — 23 items
CATH ROBINSON RED A/P 16FR (CATHETERS) ×1 IMPLANT
CLOTH BEACON ORANGE TIMEOUT ST (SAFETY) ×3 IMPLANT
DECANTER SPIKE VIAL GLASS SM (MISCELLANEOUS) ×3 IMPLANT
GLOVE BIOGEL PI IND STRL 8 (GLOVE) ×1 IMPLANT
GLOVE BIOGEL PI INDICATOR 8 (GLOVE) ×2
GLOVE ECLIPSE 8.0 STRL XLNG CF (GLOVE) ×3 IMPLANT
GOWN STRL REUS W/TWL LRG LVL3 (GOWN DISPOSABLE) ×6 IMPLANT
KIT BERKELEY 1ST TRIMESTER 3/8 (MISCELLANEOUS) ×3 IMPLANT
NDL SPNL 22GX3.5 QUINCKE BK (NEEDLE) ×1 IMPLANT
NEEDLE SPNL 22GX3.5 QUINCKE BK (NEEDLE) ×3 IMPLANT
NS IRRIG 1000ML POUR BTL (IV SOLUTION) ×3 IMPLANT
PACK VAGINAL MINOR WOMEN LF (CUSTOM PROCEDURE TRAY) ×3 IMPLANT
PAD OB MATERNITY 4.3X12.25 (PERSONAL CARE ITEMS) ×3 IMPLANT
PAD PREP 24X48 CUFFED NSTRL (MISCELLANEOUS) ×3 IMPLANT
SET BERKELEY SUCTION TUBING (SUCTIONS) ×3 IMPLANT
SLEEVE SCD COMPRESS KNEE MED (MISCELLANEOUS) ×2 IMPLANT
SYR CONTROL 10ML LL (SYRINGE) ×3 IMPLANT
TOWEL OR 17X24 6PK STRL BLUE (TOWEL DISPOSABLE) ×6 IMPLANT
VACURETTE 10 RIGID CVD (CANNULA) IMPLANT
VACURETTE 12 RIGID CVD (CANNULA) ×2 IMPLANT
VACURETTE 7MM CVD STRL WRAP (CANNULA) IMPLANT
VACURETTE 8 RIGID CVD (CANNULA) IMPLANT
VACURETTE 9 RIGID CVD (CANNULA) IMPLANT

## 2014-03-01 NOTE — Progress Notes (Signed)
4 ray tec used to wipe blood away from  the cervix

## 2014-03-01 NOTE — Progress Notes (Signed)
Pelvic exam done at 1800

## 2014-03-01 NOTE — MAU Note (Addendum)
Had vaginal delivery 2 wks ago 02/10/2014. Pt states she also lost a lot of blood after delivery. Pt doesn't appear to have active bleeding now but had large gush of blood early today. Pt transferred from [facility] via ambulance IV in both anticubital veins, Normal Saline infusing to right arm IV lactated ringers infusion initiated to left arm. O2 infusing via nasal canullar.

## 2014-03-01 NOTE — Progress Notes (Signed)
CRITICAL VALUE ALERT  Critical value received:  Hgb 6.3 and Hct 20.4  Date of notification:  03/01/14   Time of notification:  9371   Critical value read back:yes  Nurse who received alert:  Erasmo Score RN  MD notified (1st page):  Dr. Glo Herring in department  Time of first page:  1757  Responding MD:  Dr. Glo Herring  Time MD responded:  [phone]

## 2014-03-01 NOTE — Anesthesia Procedure Notes (Addendum)
Spinal  Patient location during procedure: OB Start time: 03/01/2014 10:33 PM End time: 03/01/2014 10:37 PM Staffing Anesthesiologist: Trystyn Sitts, CHRIS Preanesthetic Checklist Completed: patient identified, surgical consent, pre-op evaluation, timeout performed, IV checked, risks and benefits discussed and monitors and equipment checked Spinal Block Patient position: sitting Prep: site prepped and draped and DuraPrep Patient monitoring: heart rate, cardiac monitor, continuous pulse ox and blood pressure Approach: midline Location: L4-5 Injection technique: single-shot Needle Needle type: Pencan  Needle gauge: 24 G Needle length: 10 cm Assessment Sensory level: T8

## 2014-03-01 NOTE — Op Note (Signed)
Preoperative diagnosis:  Delayed postpartum hemorrhage                                         Retained placental fragments                                         Severe anemia requiring transfusion  Postoperative diagnosis:  Same as above  Procedure:  Suction and sharp uterine curettage for retained placental fragments  Anesthesia:  Spinal  Findings:  Patient delivered 17 days ago and had had no problems at all until this afternoon when she began to have heavy vaginal bleeding and suffered a near syncopal episode.  She was brought to the office and found to be tachycardic pale with a blood pressure of 90/50 but responsive. As a result she was brought to [facility] for further evaluation.  A sonogram revealed retained placental fragments and blood in the endometrium.  Hemoglobin was 6.2.  She's been transfused.  Intraoperatively the patient had Fairly significant amount of plac tissue retained which was removed without difficulty with the suction.  The uterus was somewhat hypertonic as expected but responded to Methergine and IV Pitocin.  Description of operation:  The patient underwent a spinal anesthetic.  She was then placed in the dorsal lithotomy position.  She was prepped and draped in usual sterile fashion.  A Graves speculum was placed and the single-tooth tenaculum was used to grasp the anterior cervix.  A 12 French curved suction curette was used and several passes were made returning a significant amount of placental tissue some of which would not go through the 12 French curette.  I also made gentle passes with the sharp curette to ensure good uterine crie and removal of all placental tissue.  I was convinced that all the tissue had been removed.  Methargen and IV Pitocin were given to use as oxytoxic agents. At this point she's been given 2 units of blood and one more as ordered.  She's taken to the recovery room in good stable condition all counts were correct.  Ancef 2 g was given  preoperatively prophylactically and  Blood loss for the procedure was 200 cc  Florian Buff 03/01/2014 11:22 PM .

## 2014-03-01 NOTE — Anesthesia Preprocedure Evaluation (Signed)
Anesthesia Evaluation  Patient identified by MRN, date of birth, ID band Patient awake    Reviewed: Allergy & Precautions, H&P , NPO status , Patient's Chart, lab work & pertinent test results  History of Anesthesia Complications Negative for: history of anesthetic complications  Airway Mallampati: I TM Distance: >3 FB Neck ROM: Full    Dental  (+) Teeth Intact   Pulmonary neg pulmonary ROS,  breath sounds clear to auscultation        Cardiovascular negative cardio ROS  Rhythm:Regular     Neuro/Psych negative neurological ROS     GI/Hepatic negative GI ROS, Neg liver ROS,   Endo/Other  negative endocrine ROS  Renal/GU negative Renal ROS     Musculoskeletal   Abdominal   Peds  Hematology  (+) anemia , Post partum hemorrhage, s/p two units pRBCs   Anesthesia Other Findings   Reproductive/Obstetrics                           Anesthesia Physical Anesthesia Plan  ASA: II  Anesthesia Plan: Spinal   Post-op Pain Management:    Induction:   Airway Management Planned: Natural Airway  Additional Equipment: None  Intra-op Plan:   Post-operative Plan:   Informed Consent: I have reviewed the patients History and Physical, chart, labs and discussed the procedure including the risks, benefits and alternatives for the proposed anesthesia with the patient or authorized representative who has indicated his/her understanding and acceptance.   Dental advisory given  Plan Discussed with: CRNA and Surgeon  Anesthesia Plan Comments:         Anesthesia Quick Evaluation

## 2014-03-01 NOTE — Progress Notes (Signed)
4:33 PM 70/40 50 pulse

## 2014-03-01 NOTE — Transfer of Care (Signed)
Immediate Anesthesia Transfer of Care Note  Patient: Judy Lang  Procedure(s) Performed: Procedure(s): DILATATION AND EVACUATION (N/A)  Patient Location: PACU  Anesthesia Type:Spinal  Level of Consciousness: awake, alert , oriented and patient cooperative  Airway & Oxygen Therapy: Patient Spontanous Breathing  Post-op Assessment: Report given to PACU RN, Post -op Vital signs reviewed and stable and Patient moving all extremities X 4  Post vital signs: Reviewed and stable  Complications: No apparent anesthesia complications
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
## 2014-03-01 NOTE — Progress Notes (Signed)
I have assessed the Judy Lang's clinical situation and agree with plan of uterine curettage. I have spoken with Judy Lang and she agrees with plan,.  Florian Buff 03/01/2014 9:56 PM

## 2014-03-01 NOTE — Progress Notes (Signed)
No adverse reaction noted.

## 2014-03-01 NOTE — Progress Notes (Signed)
Vagina packed with 4 ray tec

## 2014-03-02 ENCOUNTER — Encounter (HOSPITAL_COMMUNITY): Payer: Self-pay | Admitting: Obstetrics & Gynecology

## 2014-03-02 LAB — TYPE AND SCREEN
ABO/RH(D): O POS
Antibody Screen: NEGATIVE
UNIT DIVISION: 0
Unit division: 0
Unit division: 0
Unit division: 0

## 2014-03-02 LAB — CBC
HEMATOCRIT: 29.7 % — AB (ref 36.0–46.0)
HEMOGLOBIN: 9.9 g/dL — AB (ref 12.0–15.0)
MCH: 28.9 pg (ref 26.0–34.0)
MCHC: 33.3 g/dL (ref 30.0–36.0)
MCV: 86.8 fL (ref 78.0–100.0)
Platelets: 221 10*3/uL (ref 150–400)
RBC: 3.42 MIL/uL — ABNORMAL LOW (ref 3.87–5.11)
RDW: 17 % — ABNORMAL HIGH (ref 11.5–15.5)
WBC: 8.9 10*3/uL (ref 4.0–10.5)

## 2014-03-02 MED ORDER — METHYLERGONOVINE MALEATE 0.2 MG PO TABS
0.2000 mg | ORAL_TABLET | Freq: Four times a day (QID) | ORAL | Status: DC
  Administered 2014-03-02: 0.2 mg via ORAL
  Filled 2014-03-02: qty 1

## 2014-03-02 MED ORDER — PRENATAL MULTIVITAMIN CH: 1.0000 | ORAL_TABLET | Freq: Every day | ORAL | Status: DC

## 2014-03-02 MED ORDER — OXYTOCIN 40 UNITS IN LACTATED RINGERS INFUSION - SIMPLE MED
125.0000 mL/h | INTRAVENOUS | Status: DC
  Administered 2014-03-02: 125 mL/h via INTRAVENOUS

## 2014-03-02 MED ORDER — SODIUM CHLORIDE 0.9 % IV SOLN
3.0000 g | Freq: Once | INTRAVENOUS | Status: AC
  Administered 2014-03-02: 3 g via INTRAVENOUS
  Filled 2014-03-02: qty 3

## 2014-03-02 MED ORDER — AMOXICILLIN-POT CLAVULANATE 875-125 MG PO TABS: 1.0000 | ORAL_TABLET | Freq: Two times a day (BID) | ORAL | Status: DC

## 2014-03-02 MED ORDER — METHYLERGONOVINE MALEATE 0.2 MG PO TABS: 0.2000 mg | ORAL_TABLET | Freq: Four times a day (QID) | ORAL | Status: DC

## 2014-03-02 MED ORDER — IBUPROFEN 600 MG PO TABS
600.0000 mg | ORAL_TABLET | Freq: Four times a day (QID) | ORAL | Status: DC
  Administered 2014-03-02: 600 mg via ORAL
  Filled 2014-03-02: qty 1

## 2014-03-02 NOTE — Anesthesia Postprocedure Evaluation (Signed)
  Anesthesia Post-op Note  Patient: Judy Lang  Procedure(s) Performed: Procedure(s): DILATATION AND EVACUATION (N/A)  Patient Location: PACU  Anesthesia Type:Spinal  Level of Consciousness: awake and alert   Airway and Oxygen Therapy: Patient Spontanous Breathing  Post-op Pain: none  Post-op Assessment: Post-op Vital signs reviewed, Patient's Cardiovascular Status Stable, Respiratory Function Stable, Patent Airway, No signs of Nausea or vomiting and Pain level controlled  Post-op Vital Signs: Reviewed and stable  Last Vitals:  Filed Vitals:   03/02/14 0141  BP: 121/63  Pulse: 51  Temp: 36.4 C  Resp: 18    Complications: No apparent anesthesia complications

## 2014-03-02 NOTE — Discharge Summary (Signed)
Physician Discharge Summary  Patient ID: Judy Lang MRN: [id number] DOB/AGE: 14-Aug-1986 27 y.o.  Admit date: 03/01/2014 Discharge date: 03/02/2014  Admission Diagnoses: Late post partum hemorrhage anemia Discharge Diagnoses:  Active Problems:   PPH (postpartum hemorrhage)   Discharged Condition: stable  Hospital Course: see H&P and op notes  Consults: None  Significant Diagnostic Studies: labs: CBC  Treatments: surgery: D&C  Discharge Exam: Blood pressure 110/54, pulse 51, temperature 97.9 F (36.6 C), temperature source Oral, resp. rate 16, height 5\' 2"  (1.575 m), weight 120 lb (54.432 kg), SpO2 100.00%, unknown if currently breastfeeding.   Disposition: 01-Home or Self Care  Discharge Orders   Future Appointments Provider Department Dept Phone   03/14/2014 10:30 AM Christin Fudge, CNM [facility] [phone]   Future Orders Complete By Expires   Call MD for:  As directed    Scheduling Instructions:   Heavy bleeding or fever   Diet - low sodium heart healthy  As directed    Increase activity slowly  As directed    Sexual Activity Restrictions  As directed        Medication List         amoxicillin-clavulanate 875-125 MG per tablet  Commonly known as:  AUGMENTIN  Take 1 tablet by mouth 2 (two) times daily.     ferrous fumarate 325 (106 FE) MG Tabs tablet  Commonly known as:  HEMOCYTE - 106 mg FE  Take 1 tablet by mouth daily.     ibuprofen 600 MG tablet  Commonly known as:  ADVIL,MOTRIN  Take 1 tablet (600 mg total) by mouth every 6 (six) hours.     methylergonovine 0.2 MG tablet  Commonly known as:  METHERGINE  Take 1 tablet (0.2 mg total) by mouth every 6 (six) hours.     prenatal multivitamin Tabs tablet  Take 1 tablet by mouth daily.      augmentin because she is breast feeding     Follow-up Information   Follow up with Florian Buff, MD In 1 week.   Specialties:  Obstetrics and Gynecology, Radiology   Contact  information:   [place] [phone]       Signed: Florian Buff 03/02/2014, 7:13 AM

## 2014-03-02 NOTE — Anesthesia Postprocedure Evaluation (Signed)
Anesthesia Post Note  Patient: Judy Lang  Procedure(s) Performed: Procedure(s) (LRB): DILATATION AND EVACUATION (N/A)  Anesthesia type: SAB  Patient location: Mother/Baby  Post pain: Pain level controlled  Post assessment: Post-op Vital signs reviewed  Last Vitals:  Filed Vitals:   03/02/14 0517  BP: 110/54  Pulse: 51  Temp: 36.6 C  Resp: 16    Post vital signs: Reviewed  Level of consciousness: awake  Complications: No apparent anesthesia complications

## 2014-03-02 NOTE — Addendum Note (Signed)
Addendum created 03/02/14 0751 by Talbot Grumbling, CRNA   Modules edited: Notes Section   Notes Section:  File: [id number]
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
## 2014-03-02 NOTE — Discharge Instructions (Signed)
Postpartum Hemorrhage Postpartum hemorrhage is excessive blood loss after childbirth. Some blood loss is normal after delivering a baby. However, postpartum hemorrhage is a potentially serious condition.  CAUSES   A loss of muscle tone in the uterus after childbirth.  Failure to deliver all of the placenta.  Wounds in the birth canal caused by delivery of the fetus.  A maternal bleeding disorder that prevents blood clotting (rare). RISK FACTORS You are at greater risk for postpartum hemorrhage if you:  Have a history of postpartum hemorrhage.  Have delivered more than one baby.  Had preeclampsia or eclampsia.  Had problems with the placenta.  Had complications during your labor or delivery.  Are obese.  Are Asian or Hispanic. SIGNS AND SYMPTOMS  Vaginal bleeding after delivery is normal and should be expected. Bleeding (lochia) will occur for several days after childbirth. This can be expected with normal vaginal deliveries and cesarean deliveries.  You are bleeding too much after your delivery if you are:  Passing large clots or pieces of tissue. This may be small pieces of placenta left after delivery.   Soaking more than one sanitary pad per hour for several hours.   Having heavy, bright-red bleeding that occurs 4 days or more after delivery.   Having a discharge that has a bad smell or if you begin to run an unexplained fever.   Having times of lightheadedness or fainting, feeling short of breath, or having your heart beat fast with very little activity.  DIAGNOSIS  A diagnosis is based on your symptoms and a physical exam of your perineum, vagina, cervix, and uterus. Diagnostic tests may include:  Blood pressure and pulse.  Blood tests.  Blood clotting tests.  Ultrasonography. TREATMENT  Treatment is based on the severity of bleeding and may include:  Uterine massage.  Medicines.  Blood transfusions.  Sometimes bleeding occurs if portions of the  placenta are left behind in the uterus after delivery. If this happens, often a curettage or scraping of the inside of the uterus must be done. This usually stops the bleeding. If this treatment does not stop the bleeding, surgery (hysterectomy) may have to be performed to remove the uterus.  If bleeding is due to clotting or bleeding problems that are not related to the pregnancy, other treatments may be needed.  HOME CARE INSTRUCTIONS   Limit your activity as directed by your health care provider.Your health care provider may order bed rest (getting up to the bathroom only) or may allow you to continue light activity.   Keep track of the number of pads you use each day and how soaked (saturated) they are. Write this number down.   Do not use tampons. Do not douche or have sexual intercourse until approved by your health care provider.   Drink enough fluids to keep your urine clear or pale yellow.   Get proper amounts of rest.   Eat foods that are rich in iron, such as spinach, red meat, and legumes.  SEEK IMMEDIATE MEDICAL CARE IF:  You experience severe cramps in your stomach, back, or belly (abdomen).   You have a fever.   You pass large clots or tissue. Save any tissue for your health care provider to look at.   Your bleeding increases.  You become weak or lightheaded, or you pass out.   Your sanitary pad count per hour is increasing. Document Released: 12/27/2003 Document Revised: 06/08/2013 Document Reviewed: 03/24/2013 [facility] Patient Information 2014 [place], [place].

## 2014-03-03 ENCOUNTER — Telehealth: Payer: Self-pay | Admitting: *Deleted

## 2014-03-07 NOTE — Telephone Encounter (Signed)
Pt states that she feels better, pt states that she has not been bleeding much, Pt states that she has been taking her medication. Pt states that she had a headache for about 3 days. Pt was advised to keep appointment for this week and next week. Pt verbalized understanding.

## 2014-03-08 ENCOUNTER — Ambulatory Visit (INDEPENDENT_AMBULATORY_CARE_PROVIDER_SITE_OTHER): Payer: Medicaid Other | Admitting: Obstetrics & Gynecology

## 2014-03-08 ENCOUNTER — Encounter: Payer: Self-pay | Admitting: Obstetrics & Gynecology

## 2014-03-08 VITALS — BP 100/60 | Wt 116.0 lb

## 2014-03-08 DIAGNOSIS — O9081 Anemia of the puerperium: Secondary | ICD-10-CM

## 2014-03-08 DIAGNOSIS — Z9889 Other specified postprocedural states: Secondary | ICD-10-CM

## 2014-03-08 DIAGNOSIS — D649 Anemia, unspecified: Secondary | ICD-10-CM

## 2014-03-08 LAB — POCT HEMOGLOBIN: Hemoglobin: 10.6 g/dL — AB (ref 12.2–16.2)

## 2014-03-08 NOTE — Progress Notes (Signed)
Patient ID: Judy Lang, female   DOB: 1986-07-22, 28 y.o.   MRN: [id number] See op and hospital notes from last week  In today doing well No complaints Minimal bleeding and spotting No pain No foul smelling discharge  No burning with urination, frequency or urgency No nausea, vomiting or diarrhea Nor fever chills or other constitutional symptoms  Exam Cervix is closed minimal bleeding Uterus normal involution Non tender Adnexa negative  Impression S/P D&C for retained placental fragments  Plan Keep pp exam next week

## 2014-03-08 NOTE — Addendum Note (Signed)
Addended by: Linton Rump on: 03/08/2014 04:31 PM   Modules accepted: Orders

## 2014-03-14 ENCOUNTER — Ambulatory Visit: Payer: Medicaid Other | Admitting: Advanced Practice Midwife

## 2014-03-21 ENCOUNTER — Ambulatory Visit (INDEPENDENT_AMBULATORY_CARE_PROVIDER_SITE_OTHER): Payer: Medicaid Other | Admitting: Advanced Practice Midwife

## 2014-03-21 ENCOUNTER — Encounter: Payer: Self-pay | Admitting: Advanced Practice Midwife

## 2014-03-21 NOTE — Progress Notes (Signed)
  Judy Lang is a 28 y.o. who presents for a postpartum visit. She is 6 weeks postpartum following a spontaneous vaginal delivery. I have fully reviewed the prenatal and intrapartum course. The delivery was at 39.0  gestational weeks.  Anesthesia: epidural. Postpartum course has been complicated by severe PPH at 3 weeks pp d/t retained placenta.  Had blood tx and D&C. Baby's course has been uneventful. Baby is feeding by breast. Bleeding: no bleeding. Bowel function is normal. Bladder function is normal. Patient is not sexually active. Contraception method is none. Postpartum depression screening: negative.    Review of Systems   Constitutional: Negative for fever and chills Eyes: Negative for visual disturbances Respiratory: Negative for shortness of breath, dyspnea Cardiovascular: Negative for chest pain or palpitations  Gastrointestinal: Negative for vomiting, diarrhea and constipation Genitourinary: Negative for dysuria and urgency Musculoskeletal: Negative for back pain, joint pain, myalgias  Neurological: Negative for dizziness and headaches   Objective:     Filed Vitals:   03/21/14 1145  BP: 90/60   General:  alert, cooperative and no distress   Breasts:  negative  Lungs: clear to auscultation bilaterally  Heart:  regular rate and rhythm  Abdomen: Soft, nontender   Vulva:  normal  Vagina: normal vagina  Cervix:  closed  Corpus: Well involuted     Rectal Exam: no hemorrhoids        Assessment:    normal postpartum exam.  Plan:    1. Contraception: Nexplanon 2. Follow up in:  ASAP or as needed.

## 2014-03-22 ENCOUNTER — Ambulatory Visit (INDEPENDENT_AMBULATORY_CARE_PROVIDER_SITE_OTHER): Payer: Medicaid Other | Admitting: Women's Health

## 2014-03-22 ENCOUNTER — Encounter: Payer: Self-pay | Admitting: Women's Health

## 2014-03-22 VITALS — BP 100/60 | Wt 113.0 lb

## 2014-03-22 DIAGNOSIS — Z3202 Encounter for pregnancy test, result negative: Secondary | ICD-10-CM

## 2014-03-22 DIAGNOSIS — Z30017 Encounter for initial prescription of implantable subdermal contraceptive: Secondary | ICD-10-CM

## 2014-03-22 LAB — POCT URINE PREGNANCY: Preg Test, Ur: NEGATIVE

## 2014-03-22 NOTE — Progress Notes (Signed)
Patient ID: ROSELL KHOURI, female   DOB: [birth year], 28 y.o.   MRN: [id number] MAGDALENA SKILTON is a 28 y.o. year old Hispanic female here for Nexplanon insertion.  No LMP recorded., last sexual intercourse was prior to birth of baby >6wks ago, and her pregnancy test today was negative.  Risks/benefits/side effects of Nexplanon have been discussed and her questions have been answered.  Specifically, a failure rate of 10/998 has been reported, with an increased failure rate if pt takes [place] and/or antiseizure medicaitons.  [name] is aware of the common side effect of irregular bleeding, which the incidence of decreases over time.  BP 100/60  Wt 113 lb (51.256 kg)  Results for orders placed in visit on 03/22/14 (from the past 24 hour(s))  POCT URINE PREGNANCY   Collection Time    03/22/14 11:42 AM      Result Value Ref Range   Preg Test, Ur Negative       She is right-handed, so her left arm, approximately 4 inches proximal from the elbow, was cleansed with alcohol and anesthetized with 2cc of 2% Lidocaine.  The area was cleansed again with betadine and the Nexplanon was inserted per manufacturer's recommendations without difficulty.  A steri-strip and pressure bandage were applied.  Pt was instructed to keep the area clean and dry, remove pressure bandage in 24 hours, and keep insertion site covered with the steri-strip for 3-5 days.  Back up contraception was recommended for 2 weeks.  She was given a card indicating date Nexplanon was inserted and date it needs to be removed. Follow-up PRN problems. To continue fe supplement and let us know if bleeding becomes heavy w/ nexplanon d/t her recent h/o delayed pp hemorrhage requiring transfusion.  Keighley Deckman CNM, [facility] 03/22/2014 12:18 PM

## 2014-03-22 NOTE — Patient Instructions (Signed)
Keep the area clean and dry.  You can remove the big bandage in 24 hours, and the small steri-strip bandage in 3-5 days.  A back up method, such as condoms, should be used for two weeks. You may have irregular vaginal bleeding for the first 6 months after the Nexplanon is placed, then the bleeding usually lightens and it is possible that you may not have any periods.  If you have any concerns, please give [place] a call.    Etonogestrel implant-Nexplanon What is this medicine? ETONOGESTREL (et oh noe JES trel) is a contraceptive (birth control) device. It is used to prevent pregnancy. It can be used for up to 3 years. This medicine may be used for other purposes; ask your health care provider or pharmacist if you have questions. COMMON BRAND NAME(S): Implanon, Nexplanon  What should I tell my health care provider before I take this medicine? They need to know if you have any of these conditions: -abnormal vaginal bleeding -blood vessel disease or blood clots -cancer of the breast, cervix, or liver -depression -diabetes -gallbladder disease -headaches -heart disease or recent heart attack -high blood pressure -high cholesterol -kidney disease -liver disease -renal disease -seizures -tobacco smoker -an unusual or allergic reaction to etonogestrel, other hormones, anesthetics or antiseptics, medicines, foods, dyes, or preservatives -pregnant or trying to get pregnant -breast-feeding How should I use this medicine? This device is inserted just under the skin on the inner side of your upper arm by a health care professional. Talk to your pediatrician regarding the use of this medicine in children. Special care may be needed. Overdosage: If you think you've taken too much of this medicine contact a poison control center or emergency room at once. Overdosage: If you think you have taken too much of this medicine contact a poison control center or emergency room at once. NOTE: This medicine is  only for you. Do not share this medicine with others. What if I miss a dose? This does not apply. What may interact with this medicine? Do not take this medicine with any of the following medications: -amprenavir -bosentan -fosamprenavir This medicine may also interact with the following medications: -barbiturate medicines for inducing sleep or treating seizures -certain medicines for fungal infections like ketoconazole and itraconazole -griseofulvin -medicines to treat seizures like carbamazepine, felbamate, oxcarbazepine, phenytoin, topiramate -modafinil -phenylbutazone -rifampin -some medicines to treat HIV infection like atazanavir, indinavir, lopinavir, nelfinavir, tipranavir, ritonavir -St. John's wort This list may not describe all possible interactions. Give your health care provider a list of all the medicines, herbs, non-prescription drugs, or dietary supplements you use. Also tell them if you smoke, drink alcohol, or use illegal drugs. Some items may interact with your medicine. What should I watch for while using this medicine? This product does not protect you against HIV infection (AIDS) or other sexually transmitted diseases. You should be able to feel the implant by pressing your fingertips over the skin where it was inserted. Tell your doctor if you cannot feel the implant. What side effects may I notice from receiving this medicine? Side effects that you should report to your doctor or health care professional as soon as possible: -allergic reactions like skin rash, itching or hives, swelling of the face, lips, or tongue -breast lumps -changes in vision -confusion, trouble speaking or understanding -dark urine -depressed mood -general ill feeling or flu-like symptoms -light-colored stools -loss of appetite, nausea -right upper belly pain -severe headaches -severe pain, swelling, or tenderness in the abdomen -shortness  of breath, chest pain, swelling in a  leg -signs of pregnancy -sudden numbness or weakness of the face, arm or leg -trouble walking, dizziness, loss of balance or coordination -unusual vaginal bleeding, discharge -unusually weak or tired -yellowing of the eyes or skin Side effects that usually do not require medical attention (Report these to your doctor or health care professional if they continue or are bothersome.): -acne -breast pain -changes in weight -cough -fever or chills -headache -irregular menstrual bleeding -itching, burning, and vaginal discharge -pain or difficulty passing urine -sore throat This list may not describe all possible side effects. Call your doctor for medical advice about side effects. You may report side effects to FDA at 1-800-FDA-1088. Where should I keep my medicine? This drug is given in a hospital or clinic and will not be stored at home. NOTE: This sheet is a summary. It may not cover all possible information. If you have questions about this medicine, talk to your doctor, pharmacist, or health care provider.  2014, Elsevier/Gold Standard. (2012-04-12 15:37:45)

## 2014-08-21 ENCOUNTER — Encounter: Payer: Self-pay | Admitting: Women's Health

## 2015-05-01 ENCOUNTER — Other Ambulatory Visit: Payer: Medicaid Other | Admitting: Obstetrics & Gynecology

## 2015-05-08 ENCOUNTER — Encounter: Payer: Self-pay | Admitting: Obstetrics & Gynecology

## 2015-05-08 ENCOUNTER — Other Ambulatory Visit (HOSPITAL_COMMUNITY)
Admission: RE | Admit: 2015-05-08 | Discharge: 2015-05-08 | Disposition: A | Discharge status: Home / Self Care | Payer: Medicaid Other | Source: Ambulatory Visit | Attending: Obstetrics & Gynecology | Admitting: Obstetrics & Gynecology

## 2015-05-08 ENCOUNTER — Ambulatory Visit (INDEPENDENT_AMBULATORY_CARE_PROVIDER_SITE_OTHER): Payer: Medicaid Other | Admitting: Obstetrics & Gynecology

## 2015-05-08 VITALS — BP 90/60 | HR 72 | Ht 63.0 in | Wt 105.0 lb

## 2015-05-08 DIAGNOSIS — Z01419 Encounter for gynecological examination (general) (routine) without abnormal findings: Secondary | ICD-10-CM

## 2015-05-08 DIAGNOSIS — Z0000 Encounter for general adult medical examination without abnormal findings: Secondary | ICD-10-CM | POA: Diagnosis not present

## 2015-05-08 MED ORDER — BUTALBITAL-APAP-CAFFEINE 50-325-40 MG PO TABS: 1.0000 | ORAL_TABLET | Freq: Four times a day (QID) | ORAL | Status: DC | PRN
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
## 2015-05-11 LAB — CYTOLOGY - PAP

## 2015-07-12 DIAGNOSIS — Z029 Encounter for administrative examinations, unspecified: Secondary | ICD-10-CM

## 2015-08-13 ENCOUNTER — Emergency Department (HOSPITAL_COMMUNITY)
Admission: EM | Admit: 2015-08-13 | Discharge: 2015-08-13 | Disposition: A | Discharge status: Home / Self Care | Payer: Medicaid Other | Attending: Emergency Medicine | Admitting: Emergency Medicine

## 2015-08-13 ENCOUNTER — Emergency Department (HOSPITAL_COMMUNITY): Payer: Medicaid Other

## 2015-08-13 ENCOUNTER — Encounter (HOSPITAL_COMMUNITY): Payer: Self-pay

## 2015-08-13 DIAGNOSIS — Z3202 Encounter for pregnancy test, result negative: Secondary | ICD-10-CM | POA: Insufficient documentation

## 2015-08-13 DIAGNOSIS — Z8619 Personal history of other infectious and parasitic diseases: Secondary | ICD-10-CM | POA: Insufficient documentation

## 2015-08-13 DIAGNOSIS — R509 Fever, unspecified: Secondary | ICD-10-CM | POA: Diagnosis not present

## 2015-08-13 DIAGNOSIS — R1084 Generalized abdominal pain: Secondary | ICD-10-CM | POA: Insufficient documentation

## 2015-08-13 DIAGNOSIS — R1030 Lower abdominal pain, unspecified: Secondary | ICD-10-CM | POA: Diagnosis present

## 2015-08-13 DIAGNOSIS — R112 Nausea with vomiting, unspecified: Secondary | ICD-10-CM | POA: Insufficient documentation

## 2015-08-13 DIAGNOSIS — R110 Nausea: Secondary | ICD-10-CM

## 2015-08-13 DIAGNOSIS — R1010 Upper abdominal pain, unspecified: Secondary | ICD-10-CM

## 2015-08-13 DIAGNOSIS — Z91040 Latex allergy status: Secondary | ICD-10-CM | POA: Diagnosis not present

## 2015-08-13 LAB — URINALYSIS, ROUTINE W REFLEX MICROSCOPIC
Bilirubin Urine: NEGATIVE
Glucose, UA: NEGATIVE mg/dL
Ketones, ur: NEGATIVE mg/dL
Leukocytes, UA: NEGATIVE
NITRITE: NEGATIVE
PH: 5.5 (ref 5.0–8.0)
Protein, ur: NEGATIVE mg/dL
Urobilinogen, UA: 0.2 mg/dL (ref 0.0–1.0)

## 2015-08-13 LAB — CBC WITH DIFFERENTIAL/PLATELET
BASOS PCT: 0 %
Basophils Absolute: 0 10*3/uL (ref 0.0–0.1)
Eosinophils Absolute: 0 10*3/uL (ref 0.0–0.7)
Eosinophils Relative: 0 %
HEMATOCRIT: 40.6 % (ref 36.0–46.0)
HEMOGLOBIN: 13.2 g/dL (ref 12.0–15.0)
LYMPHS ABS: 1.5 10*3/uL (ref 0.7–4.0)
LYMPHS PCT: 13 %
MCH: 31.1 pg (ref 26.0–34.0)
MCHC: 32.5 g/dL (ref 30.0–36.0)
MCV: 95.5 fL (ref 78.0–100.0)
MONOS PCT: 3 %
Monocytes Absolute: 0.3 10*3/uL (ref 0.1–1.0)
NEUTROS ABS: 10 10*3/uL — AB (ref 1.7–7.7)
NEUTROS PCT: 84 %
Platelets: 255 10*3/uL (ref 150–400)
RBC: 4.25 MIL/uL (ref 3.87–5.11)
RDW: 13 % (ref 11.5–15.5)
WBC: 11.8 10*3/uL — ABNORMAL HIGH (ref 4.0–10.5)

## 2015-08-13 LAB — COMPREHENSIVE METABOLIC PANEL
ALBUMIN: 4.6 g/dL (ref 3.5–5.0)
ALK PHOS: 57 U/L (ref 38–126)
ALT: 20 U/L (ref 14–54)
ANION GAP: 6 (ref 5–15)
AST: 23 U/L (ref 15–41)
BILIRUBIN TOTAL: 0.7 mg/dL (ref 0.3–1.2)
BUN: 14 mg/dL (ref 6–20)
CALCIUM: 9.3 mg/dL (ref 8.9–10.3)
CO2: 27 mmol/L (ref 22–32)
CREATININE: 0.55 mg/dL (ref 0.44–1.00)
Chloride: 107 mmol/L (ref 101–111)
GFR calc Af Amer: >60 mL/min (ref >60)
GFR calc non Af Amer: >60 mL/min (ref >60)
GLUCOSE: 184 mg/dL — AB (ref 65–99)
Potassium: 3.7 mmol/L (ref 3.5–5.1)
Sodium: 140 mmol/L (ref 135–145)
TOTAL PROTEIN: 7.9 g/dL (ref 6.5–8.1)

## 2015-08-13 LAB — URINE MICROSCOPIC-ADD ON

## 2015-08-13 LAB — PREGNANCY, URINE: Preg Test, Ur: NEGATIVE

## 2015-08-13 LAB — LIPASE, BLOOD: Lipase: 25 U/L (ref 11–51)

## 2015-08-13 MED ORDER — IOHEXOL 300 MG/ML  SOLN
100.0000 mL | Freq: Once | INTRAMUSCULAR | Status: AC | PRN
  Administered 2015-08-13: 100 mL via INTRAVENOUS

## 2015-08-13 MED ORDER — SODIUM CHLORIDE 0.9 % IV BOLUS (SEPSIS)
1000.0000 mL | Freq: Once | INTRAVENOUS | Status: AC
  Administered 2015-08-13: 1000 mL via INTRAVENOUS

## 2015-08-13 MED ORDER — FENTANYL CITRATE (PF) 100 MCG/2ML IJ SOLN
50.0000 ug | INTRAMUSCULAR | Status: DC | PRN
  Administered 2015-08-13 (×2): 50 ug via INTRAVENOUS
  Filled 2015-08-13 (×2): qty 2

## 2015-08-13 MED ORDER — METOCLOPRAMIDE HCL 5 MG/ML IJ SOLN
10.0000 mg | Freq: Once | INTRAMUSCULAR | Status: AC
  Administered 2015-08-13: 10 mg via INTRAVENOUS
  Filled 2015-08-13: qty 2

## 2015-08-13 MED ORDER — ONDANSETRON 4 MG PO TBDP: ORAL_TABLET | ORAL | Status: DC
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
## 2015-08-13 NOTE — ED Notes (Signed)
Complain of nausea and abdominal pain. Pt does not know when her last period was

## 2015-08-13 NOTE — Discharge Instructions (Signed)
If your abdominal pain worsens, you develop fevers, persistent vomiting or if your pain moves to the right lower quadrant return immediately to see your physician or come to the Emergency Department.  Thank you If you were given medicines take as directed.  If you are on coumadin or contraceptives realize their levels and effectiveness is altered by many different medicines.  If you have any reaction (rash, tongues swelling, other) to the medicines stop taking and see a physician.    If your blood pressure was elevated in the ER make sure you follow up for management with a primary doctor or return for chest pain, shortness of breath or stroke symptoms.  Please follow up as directed and return to the ER or see a physician for new or worsening symptoms.  Thank you. Filed Vitals:   08/13/15 1132 08/13/15 1133 08/13/15 1153 08/13/15 1200  BP: 150/79  150/79 127/47  Pulse:  70 78   Temp:   98.4 F (36.9 C)   TempSrc:   Oral   Resp:   15   SpO2:  94% 100%

## 2015-09-05 ENCOUNTER — Encounter: Payer: Self-pay | Admitting: Adult Health

## 2015-09-05 ENCOUNTER — Ambulatory Visit (INDEPENDENT_AMBULATORY_CARE_PROVIDER_SITE_OTHER): Payer: Medicaid Other | Admitting: Adult Health

## 2015-09-05 VITALS — BP 100/64 | HR 74 | Ht 64.0 in | Wt 108.0 lb

## 2015-09-05 DIAGNOSIS — R112 Nausea with vomiting, unspecified: Secondary | ICD-10-CM | POA: Diagnosis not present

## 2015-09-05 DIAGNOSIS — Z3202 Encounter for pregnancy test, result negative: Secondary | ICD-10-CM | POA: Diagnosis not present

## 2015-09-05 DIAGNOSIS — R110 Nausea: Secondary | ICD-10-CM | POA: Insufficient documentation

## 2015-09-05 DIAGNOSIS — Z975 Presence of (intrauterine) contraceptive device: Secondary | ICD-10-CM

## 2015-09-05 HISTORY — DX: Presence of (intrauterine) contraceptive device: Z97.5

## 2015-09-05 HISTORY — DX: Nausea with vomiting, unspecified: R11.2

## 2015-09-05 LAB — POCT URINE PREGNANCY: Preg Test, Ur: NEGATIVE

## 2015-09-05 MED ORDER — ONDANSETRON 4 MG PO TBDP: 4.0000 mg | ORAL_TABLET | Freq: Three times a day (TID) | ORAL | Status: DC | PRN

## 2015-09-05 NOTE — Progress Notes (Signed)
Subjective:     Patient ID: Judy Lang, female   DOB: Jul 24, 1986, 29 y.o.   MRN: [id number]  HPI Judy Lang is a 29 year old white female,married in complaining of nausea and vomiting like when pregnant, wants UPT.Was seen in ER in October for abdominal pain and nausea,no pain now.The nausea and vomiting is usually in am and if eats fruit and takes shower will feel better.  Review of Systems Patient denies any headaches, hearing loss, fatigue, blurred vision, shortness of breath, chest pain, abdominal pain, problems with bowel movements, urination, or intercourse. No joint pain or mood swings.See HPI for positives.  Reviewed past medical,surgical, social and family history. Reviewed medications and allergies.     Objective:   Physical Exam BP 100/64 mmHg  Pulse 74  Ht 5\' 4"  (1.626 m)  Wt 108 lb (48.988 kg)  BMI 18.53 kg/m2  Breastfeeding? No UPT negative, Skin warm and dry. Neck: mid line trachea, normal thyroid, good ROM, no lymphadenopathy noted. Lungs: clear to ausculation bilaterally. Cardiovascular: regular rate and rhythm.Abdomen soft,non tender, no HSM or Murphy's sign,nexplanon easily palpated in left arm    Assessment:     Nausea and vomiting Nexplanon in place    Plan:     Check CBC,CMP,TSH and QHCG,will talk when back Rx zofran 4 mg ODT #20 1 every 8 hours prn with 1 refill  Eat small frequent meals Follow up prn

## 2015-09-05 NOTE — Patient Instructions (Signed)
Eat small frequent meals Will talk when labs back

## 2015-09-06 ENCOUNTER — Telehealth: Payer: Self-pay | Admitting: Adult Health

## 2015-09-06 LAB — CBC
Hematocrit: 40.4 % (ref 34.0–46.6)
Hemoglobin: 13.4 g/dL (ref 11.1–15.9)
MCH: 30.8 pg (ref 26.6–33.0)
MCHC: 33.2 g/dL (ref 31.5–35.7)
MCV: 93 fL (ref 79–97)
PLATELETS: 216 10*3/uL (ref 150–379)
RBC: 4.35 x10E6/uL (ref 3.77–5.28)
RDW: 13.3 % (ref 12.3–15.4)
WBC: 6.8 10*3/uL (ref 3.4–10.8)

## 2015-09-06 LAB — COMPREHENSIVE METABOLIC PANEL
A/G RATIO: 1.7 (ref 1.1–2.5)
ALBUMIN: 4.9 g/dL (ref 3.5–5.5)
ALK PHOS: 64 IU/L (ref 39–117)
ALT: 15 IU/L (ref 0–32)
AST: 17 IU/L (ref 0–40)
BILIRUBIN TOTAL: 0.6 mg/dL (ref 0.0–1.2)
BUN / CREAT RATIO: 24 — AB (ref 8–20)
BUN: 13 mg/dL (ref 6–20)
CO2: 24 mmol/L (ref 18–29)
Calcium: 9.6 mg/dL (ref 8.7–10.2)
Chloride: 103 mmol/L (ref 97–106)
Creatinine, Ser: 0.55 mg/dL — ABNORMAL LOW (ref 0.57–1.00)
GFR calc Af Amer: 149 mL/min/{1.73_m2} (ref >59)
GFR calc non Af Amer: 129 mL/min/{1.73_m2} (ref >59)
GLUCOSE: 90 mg/dL (ref 65–99)
Globulin, Total: 2.9 g/dL (ref 1.5–4.5)
POTASSIUM: 4.3 mmol/L (ref 3.5–5.2)
Sodium: 142 mmol/L (ref 136–144)
Total Protein: 7.8 g/dL (ref 6.0–8.5)

## 2015-09-06 LAB — TSH: TSH: 1.15 u[IU]/mL (ref 0.450–4.500)

## 2015-09-06 LAB — BETA HCG QUANT (REF LAB): hCG Quant: <1 m[IU]/mL

## 2015-09-06 NOTE — Telephone Encounter (Signed)
Pt aware of labs,let me know if does not feel better soon

## 2015-09-26 ENCOUNTER — Telehealth: Payer: Self-pay | Admitting: Adult Health

## 2015-09-26 ENCOUNTER — Emergency Department (HOSPITAL_COMMUNITY): Payer: Medicaid Other

## 2015-09-26 ENCOUNTER — Encounter (HOSPITAL_COMMUNITY): Payer: Self-pay

## 2015-09-26 ENCOUNTER — Emergency Department (HOSPITAL_COMMUNITY)
Admission: EM | Admit: 2015-09-26 | Discharge: 2015-09-26 | Disposition: A | Discharge status: Home / Self Care | Payer: Medicaid Other | Attending: Emergency Medicine | Admitting: Emergency Medicine

## 2015-09-26 DIAGNOSIS — R112 Nausea with vomiting, unspecified: Secondary | ICD-10-CM

## 2015-09-26 DIAGNOSIS — Z975 Presence of (intrauterine) contraceptive device: Secondary | ICD-10-CM | POA: Diagnosis not present

## 2015-09-26 DIAGNOSIS — R1012 Left upper quadrant pain: Secondary | ICD-10-CM | POA: Insufficient documentation

## 2015-09-26 DIAGNOSIS — Z9889 Other specified postprocedural states: Secondary | ICD-10-CM | POA: Diagnosis not present

## 2015-09-26 DIAGNOSIS — Z91040 Latex allergy status: Secondary | ICD-10-CM | POA: Insufficient documentation

## 2015-09-26 DIAGNOSIS — Z3202 Encounter for pregnancy test, result negative: Secondary | ICD-10-CM | POA: Insufficient documentation

## 2015-09-26 DIAGNOSIS — Z79899 Other long term (current) drug therapy: Secondary | ICD-10-CM | POA: Diagnosis not present

## 2015-09-26 DIAGNOSIS — Z8619 Personal history of other infectious and parasitic diseases: Secondary | ICD-10-CM | POA: Insufficient documentation

## 2015-09-26 LAB — URINE MICROSCOPIC-ADD ON

## 2015-09-26 LAB — CBC WITH DIFFERENTIAL/PLATELET
Basophils Absolute: 0 10*3/uL (ref 0.0–0.1)
Basophils Relative: 1 %
EOS PCT: 0 %
Eosinophils Absolute: 0 10*3/uL (ref 0.0–0.7)
HCT: 46.4 % — ABNORMAL HIGH (ref 36.0–46.0)
HEMOGLOBIN: 16 g/dL — AB (ref 12.0–15.0)
LYMPHS ABS: 1.6 10*3/uL (ref 0.7–4.0)
LYMPHS PCT: 19 %
MCH: 32.2 pg (ref 26.0–34.0)
MCHC: 34.5 g/dL (ref 30.0–36.0)
MCV: 93.4 fL (ref 78.0–100.0)
Monocytes Absolute: 0.5 10*3/uL (ref 0.1–1.0)
Monocytes Relative: 5 %
Neutro Abs: 6.3 10*3/uL (ref 1.7–7.7)
Neutrophils Relative %: 75 %
PLATELETS: 224 10*3/uL (ref 150–400)
RBC: 4.97 MIL/uL (ref 3.87–5.11)
RDW: 12.3 % (ref 11.5–15.5)
WBC: 8.4 10*3/uL (ref 4.0–10.5)

## 2015-09-26 LAB — URINALYSIS, ROUTINE W REFLEX MICROSCOPIC
Glucose, UA: NEGATIVE mg/dL
Ketones, ur: 40 mg/dL — AB
Leukocytes, UA: NEGATIVE
NITRITE: NEGATIVE
Protein, ur: 30 mg/dL — AB
pH: 5.5 (ref 5.0–8.0)

## 2015-09-26 LAB — COMPREHENSIVE METABOLIC PANEL
ALK PHOS: 58 U/L (ref 38–126)
ALT: 19 U/L (ref 14–54)
AST: 21 U/L (ref 15–41)
Albumin: 5.4 g/dL — ABNORMAL HIGH (ref 3.5–5.0)
Anion gap: 13 (ref 5–15)
BUN: 17 mg/dL (ref 6–20)
CALCIUM: 9.8 mg/dL (ref 8.9–10.3)
CO2: 27 mmol/L (ref 22–32)
CREATININE: 0.75 mg/dL (ref 0.44–1.00)
Chloride: 100 mmol/L — ABNORMAL LOW (ref 101–111)
Glucose, Bld: 104 mg/dL — ABNORMAL HIGH (ref 65–99)
Potassium: 3.3 mmol/L — ABNORMAL LOW (ref 3.5–5.1)
Sodium: 140 mmol/L (ref 135–145)
TOTAL PROTEIN: 8.7 g/dL — AB (ref 6.5–8.1)
Total Bilirubin: 1.3 mg/dL — ABNORMAL HIGH (ref 0.3–1.2)

## 2015-09-26 LAB — PREGNANCY, URINE: PREG TEST UR: NEGATIVE

## 2015-09-26 LAB — LIPASE, BLOOD: LIPASE: 37 U/L (ref 11–51)

## 2015-09-26 MED ORDER — FENTANYL CITRATE (PF) 100 MCG/2ML IJ SOLN
50.0000 ug | Freq: Once | INTRAMUSCULAR | Status: AC
  Administered 2015-09-26: 50 ug via INTRAVENOUS
  Filled 2015-09-26: qty 2

## 2015-09-26 MED ORDER — SODIUM CHLORIDE 0.9 % IV SOLN
INTRAVENOUS | Status: DC
Start: 2015-09-26 — End: 2015-09-27
  Administered 2015-09-26: 19:00:00 via INTRAVENOUS

## 2015-09-26 MED ORDER — POTASSIUM CHLORIDE CRYS ER 20 MEQ PO TBCR
40.0000 meq | EXTENDED_RELEASE_TABLET | Freq: Once | ORAL | Status: AC
  Administered 2015-09-26: 40 meq via ORAL
  Filled 2015-09-26: qty 2

## 2015-09-26 MED ORDER — FAMOTIDINE IN NACL 20-0.9 MG/50ML-% IV SOLN
20.0000 mg | Freq: Once | INTRAVENOUS | Status: AC
  Administered 2015-09-26: 20 mg via INTRAVENOUS
  Filled 2015-09-26: qty 50

## 2015-09-26 MED ORDER — ONDANSETRON 4 MG PO TBDP: 4.0000 mg | ORAL_TABLET | Freq: Three times a day (TID) | ORAL | Status: DC | PRN

## 2015-09-26 MED ORDER — GI COCKTAIL ~~LOC~~
30.0000 mL | Freq: Once | ORAL | Status: AC
  Administered 2015-09-26: 30 mL via ORAL
  Filled 2015-09-26: qty 30

## 2015-09-26 MED ORDER — PROMETHAZINE HCL 25 MG RE SUPP: 25.0000 mg | Freq: Four times a day (QID) | RECTAL | Status: DC | PRN

## 2015-09-26 MED ORDER — ONDANSETRON HCL 4 MG/2ML IJ SOLN
4.0000 mg | Freq: Once | INTRAMUSCULAR | Status: AC | PRN
  Administered 2015-09-26: 4 mg via INTRAVENOUS
  Filled 2015-09-26: qty 2

## 2015-09-26 MED ORDER — SODIUM CHLORIDE 0.9 % IV BOLUS (SEPSIS)
1000.0000 mL | Freq: Once | INTRAVENOUS | Status: AC
  Administered 2015-09-26: 1000 mL via INTRAVENOUS

## 2015-09-26 MED ORDER — ONDANSETRON HCL 4 MG/2ML IJ SOLN
4.0000 mg | Freq: Once | INTRAMUSCULAR | Status: AC
  Administered 2015-09-26: 4 mg via INTRAVENOUS
  Filled 2015-09-26: qty 2

## 2015-09-26 NOTE — ED Notes (Signed)
Pt reports n/v x 4 days.  Denies diarrhea.  Reports abd pain.  Pt says [facility] and was prescribed nausea medication.  Pt says feels like she is dehydrated.

## 2015-09-26 NOTE — Telephone Encounter (Signed)
Pt complains of nausea and vomiting for 3 days, can't keep anything down, go to ER to be evaluated
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
## 2015-09-26 NOTE — Telephone Encounter (Signed)
Pt called stating that she would like a call back from [place] did not give the reason Why. Please contact pt

## 2015-09-26 NOTE — Discharge Instructions (Signed)
°Emergency Department Resource Guide °1) Find a Doctor and Pay Out of Pocket °Although you won't have to find out who is covered by your insurance plan, it is a good idea to ask around and get recommendations. You will then need to call the office and see if the doctor you have chosen will accept you as a new patient and what types of options they offer for patients who are self-pay. Some doctors offer discounts or will set up payment plans for their patients who do not have insurance, but you will need to ask so you aren't surprised when you get to your appointment. ° °2) Contact Your Local Health Department °Not all health departments have doctors that can see patients for sick visits, but many do, so it is worth a call to see if yours does. If you don't know where your local health department is, you can check in your phone book. The CDC also has a tool to help you locate your state's health department, and many state websites also have listings of all of their local health departments. ° °3) Find a Walk-in Clinic °If your illness is not likely to be very severe or complicated, you may want to try a walk in clinic. These are popping up all over the country in pharmacies, drugstores, and shopping centers. They're usually staffed by nurse practitioners or physician assistants that have been trained to treat common illnesses and complaints. They're usually fairly quick and inexpensive. However, if you have serious medical issues or chronic medical problems, these are probably not your best option. ° °No Primary Care Doctor: °- Call [facility] at  832-8000 - they can help you locate a primary care doctor that  accepts your insurance, provides certain services, etc. °- Physician Referral Service- 1-800-533-3463 ° °Chronic Pain Problems: °Organization         Address  Phone   Notes  °[facility]  ([phone]) [phone] Patients need to be referred by their primary care doctor.  ° °Medication  Assistance: °Organization         Address  Phone   Notes  °[place] [facility] [address] °[place], [place] [address] (336) 641-8030 --Must be a resident of [place] °-- Must have NO insurance coverage whatsoever (no Medicaid/ Medicare, etc.) °-- The pt. MUST have a primary care doctor that directs their care regularly and follows them in the community °  °[facility]  (866) 331-1348   °[employer]  (888) 892-1162   ° °Agencies that provide inexpensive medical care: °Organization         Address  Phone   Notes  °[facility]  (336) 832-8035   °[facility]    (336) 832-7272   °[facility] [address] °[place], [place] [address] (336) 832-4777   °[facility] [address], °[place] (336) 271-4999   °[facility]    (336) 373-0678   °[facility]    (336) 272-1050   °[facility] ° [address], [place] Phone:  (336) 832-4444, Fax:  (336) 832-4440 Hours of Operation:  9 am - 6 pm, M-F.  Also accepts Medicaid/Medicare and self-pay.  °[facility] ° [address],  Phone: (336) [phone], Fax: (336) [phone]. Hours of Operation:  8:30 am - 5:30 pm, M-F.  Also accepts Medicaid and self-pay.  °[facility] [place] [address], [place] Phone: (336) 878-6027   °[facility] [address], [place], [place] (336)723-1848, Ext. 123 Mondays & Thursdays: 7-9 AM.  First 15 patients are seen on a first come, first serve basis. °  ° °Medicaid-accepting [place] Providers: ° °Organization         Address  Phone   Notes  °[facility] [address], [place] (336) 641-2100 Also accepts self-pay patients.  °[facility] [address], [place] ° (336) 856-9996   °[facility] [address], [place]  (336) 288-8857   °[facility] [address] [place] [address], [place] (336) 299-7000   °Veita Bland [address], [place]  ° (336) 373-1557 Only accepts [place] Access Medicaid patients after they have their name applied to their card.  ° °Self-Pay (no insurance) in [place]: ° °Organization         Address  Phone   Notes  °Sickle Cell Patients, [facility] [address], [place] (336) 832-1970   °[facility] [address], [place] (336) 832-4400   °[facility] ° [address], [place] (336) 992-4800   °[facility]/Dr. Osei-Bonsu ° [address] [place] [address], [place] or [address], [place] (336) [phone] Phone number for both [place] and [place] locations is the same.  °[facility] [address], [place] (336) 299-0000   °[facility] [address] [place] [address], [place] or [address] (336) 852-7530 °(336) 878-2260   °[facility] [address], [place] (336) 350-1642, phone; (336) 294-5005, fax Sees patients 1st and 3rd Saturday of every month.  Must not qualify for public or private insurance (i.e. Medicaid, Medicare, [place] Health Choice, Veterans' Benefits) • Household income should be no more than 200% of the poverty level •The clinic cannot treat you if you are pregnant or think you are pregnant • Sexually transmitted diseases are not treated at the clinic.  ° ° °Dental Care: °Organization         Address  Phone  Notes  °[place] [facility] [address], [place] (336) 641-6152 Accepts children up to age 21 who are enrolled in Medicaid or [place] Health Choice; pregnant women with a Medicaid card; and children who have applied for Medicaid or [place] Health Choice, but were declined, whose parents can pay a reduced fee at time of service.  °[place]  [facility] [place]  [address], [place] (336) 641-7733 Accepts children up to age 21 who are enrolled in Medicaid or [place] Health Choice; pregnant women with a Medicaid card; and children who have applied for Medicaid or  Health Choice, but were declined, whose parents can pay a reduced fee at time of service.  °[facility] ° [address], [place] (336) 641-4533 Patients are seen by appointment only. Walk-ins are not accepted. [facility] will see patients 18 years of age and older. °Monday - Tuesday (8am-5pm) °Most Wednesdays (8:30-5pm) °$30 per visit, cash only  °[facility] ° [address], [place] (336) 641-4533 Patients are seen by appointment only. Walk-ins are not accepted. [facility] will see patients 18 years of age and older. °One   Wednesday Evening (Monthly: Volunteer Based).  $30 per visit, cash only  °[facility]  (919) 537-3737 for adults; Children under age 4, call Graduate Pediatric Dentistry at (919) [phone]. Children aged 4-14, please call (919) [phone] to request a pediatric application. ° Dental services are provided in all areas of dental care including fillings, crowns and bridges, complete and partial dentures, implants, gum treatment, root canals, and extractions. Preventive care is also provided. Treatment is provided to both adults and children. °Patients are selected via a lottery and there is often a waiting list. °  °[facility] [address], °[place] ° (336) 763-8833 [email or web address] °  °[facility] [address], [place], [place] (336)723-1848, Ext. 123 Second and Fourth Thursday of each month, opens at 6:30 AM; Clinic ends at 9 AM.  Patients are seen on a first-come first-served basis, and a limited number are seen during each clinic.  ° °[facility] ° [address], [place], [place] (336) 723-7904    Eligibility Requirements °You must have lived in [place], [place], or [place] counties for at least the last three months. °  You cannot be eligible for state or federal sponsored healthcare insurance, including Veterans Administration, Medicaid, or Medicare. °  You generally cannot be eligible for healthcare insurance through your employer.  °  How to apply: °Eligibility screenings are held every Tuesday and Wednesday afternoon from 1:00 pm until 4:00 pm. You do not need an appointment for the interview!  °[facility] [address], [place], [place] 336-631-2330   °[facility] [facility]  336-342-8273   °[facility]  336-703-3100   °[facility]  336-570-6415   ° °Behavioral Health Resources in the Community: °Intensive Outpatient Programs °Organization         Address  Phone  Notes  °[facility] [address], [place], [place] 336-878-6098   °[facility] [address], [place], [place] 336-832-9800   °[facility] [address], [place], [place] ° 336-882-2125   °[facility] [address],  °[place], [place] 1-800-853-5163 or 336-641-4981   °Substance Abuse Resources °Organization         Address  Phone  Notes  °[facility]  336-882-2125   °[facility]  336-784-9470   °[facility]  336-285-9073   °[facility]  336-845-3988   °Residential & Outpatient Substance Abuse Program  1-800-659-3381   °Psychological Services °Organization         Address  Phone  Notes  °[facility]  336- 832-9600   °[facility]  336- 378-7881   °[facility] [address], [place] 1-800-853-5163 or 336-641-4981   ° °Mobile Crisis Teams °Organization         Address  Phone  Notes  °[facility], Mobile Crisis Care Unit  1-877-626-1772   °Assertive °[facility] ° [address]  [place], [place] 336-834-9664   °Sharon DeEsch [address] °[place] 336-554-5454   ° °Self-Help/Support Groups °Organization         Address  Phone             Notes  °[facility] - variety of support groups  336- 373-1402 Call for more information  °Narcotics Anonymous (NA), [facility] [address], °[place]  2 meetings at this location  ° °  Residential Treatment Programs °Organization         Address  Phone  Notes  °[facility] [address],    °[place]  1-866-801-8205   °[facility] ° [address], [place], [place] 704-293-8524   °[facility] [address], [place] 336-845-3988 Admissions: 8am-3pm M-F  °[facility] [address]    °[place], [place] 336-841-1104   °[facility] [address], [place], [place] 336-379-7146   °[facility] [address]  °[place], [place] 336-285-9073   °[facility] - Intensive Outpatient [address], [place], [place] 336-852-3033   °[facility] [address]  °[place], [place] 1-877-615-2722 or 336-784-9470   °[facility] [address] [place], [place] 336-227-7417 Accepts Medicaid  °[facility] [address]  ° [place] 1-800-659-3381 Substance Abuse/Addiction Treatment  ° °[place] Behavioral Health Resources °Organization         Address  Phone  Notes  °[facility]  (888) 581-9988   °Julie Brannon, PhD [address] [place], [place]   (336) 349-5553 or (336) 951-0000   °[facility]   [address] °[place], [place] (336) 349-4454   °[facility] [address], [place], [place] (336) 342-8316 Insurance/Medicaid/sponsorship through [facility]  °[facility] [address]                                    [place], [place] (336) 342-8316 Therapy/tele-psych/case    °[facility] [address]  ° [place], [place] (336) 349-2233    °Dr. Arfeen  (336) 349-4544   °[facility] [place]  [employer] [place] [facility] 1) [address], [place] °2) [address], [place] °3)  [address], [place] (336) 349-3220 °(336) 342-7768 ° °(336) 342-8140   °[place] [facility] (336) 342-1394 or (336) 342-3537 (After Hours)    ° ° °Eat a bland diet, avoiding greasy, fatty, fried foods, as well as spicy and acidic foods or beverages.  Avoid eating within the hour or 2 before going to bed or laying down.  Also avoid teas, colas, coffee, chocolate, pepermint and spearment.  Take over the counter pepcid, one tablet by mouth twice a day, for the next 2 to 3 weeks.  May also take over the counter maalox/mylanta, as directed on packaging, as needed for discomfort.  Take the prescriptions as directed.  Call your regular medical doctor tomorrow to schedule a follow up appointment in the next 2 days. Call the GI doctor tomorrow to schedule a follow up appointment within the next week.  Return to the Emergency Department immediately if worsening. ° °

## 2015-09-26 NOTE — ED Notes (Signed)
Patient tolerating fluids. 

## 2015-10-03 ENCOUNTER — Ambulatory Visit (INDEPENDENT_AMBULATORY_CARE_PROVIDER_SITE_OTHER): Payer: Medicaid Other | Admitting: Internal Medicine

## 2015-10-03 ENCOUNTER — Encounter (INDEPENDENT_AMBULATORY_CARE_PROVIDER_SITE_OTHER): Payer: Self-pay | Admitting: Internal Medicine

## 2015-10-03 VITALS — BP 94/58 | HR 76 | Temp 98.1°F | Ht 63.0 in | Wt 104.5 lb

## 2015-10-03 DIAGNOSIS — R112 Nausea with vomiting, unspecified: Secondary | ICD-10-CM | POA: Diagnosis not present

## 2015-10-03 LAB — COMPREHENSIVE METABOLIC PANEL
ALBUMIN: 4.7 g/dL (ref 3.6–5.1)
ALT: 12 U/L (ref 6–29)
AST: 16 U/L (ref 10–30)
Alkaline Phosphatase: 60 U/L (ref 33–115)
BILIRUBIN TOTAL: 0.3 mg/dL (ref 0.2–1.2)
BUN: 19 mg/dL (ref 7–25)
CALCIUM: 9.4 mg/dL (ref 8.6–10.2)
CHLORIDE: 102 mmol/L (ref 98–110)
CO2: 26 mmol/L (ref 20–31)
Creat: 0.59 mg/dL (ref 0.50–1.10)
GLUCOSE: 97 mg/dL (ref 65–99)
Potassium: 4.3 mmol/L (ref 3.5–5.3)
Sodium: 136 mmol/L (ref 135–146)
Total Protein: 7.4 g/dL (ref 6.1–8.1)

## 2015-10-03 LAB — CBC WITH DIFFERENTIAL/PLATELET
BASOS PCT: 1 % (ref 0–1)
Basophils Absolute: 0.1 10*3/uL (ref 0.0–0.1)
EOS PCT: 2 % (ref 0–5)
Eosinophils Absolute: 0.2 10*3/uL (ref 0.0–0.7)
HEMATOCRIT: 44.2 % (ref 36.0–46.0)
Hemoglobin: 14.6 g/dL (ref 12.0–15.0)
LYMPHS PCT: 32 % (ref 12–46)
Lymphs Abs: 2.5 10*3/uL (ref 0.7–4.0)
MCH: 30.9 pg (ref 26.0–34.0)
MCHC: 33 g/dL (ref 30.0–36.0)
MCV: 93.6 fL (ref 78.0–100.0)
MONO ABS: 0.6 10*3/uL (ref 0.1–1.0)
MONOS PCT: 8 % (ref 3–12)
MPV: 12 fL (ref 8.6–12.4)
Neutro Abs: 4.4 10*3/uL (ref 1.7–7.7)
Neutrophils Relative %: 57 % (ref 43–77)
Platelets: 208 10*3/uL (ref 150–400)
RBC: 4.72 MIL/uL (ref 3.87–5.11)
RDW: 12.6 % (ref 11.5–15.5)
WBC: 7.8 10*3/uL (ref 4.0–10.5)

## 2015-10-03 MED ORDER — OMEPRAZOLE 40 MG PO CPDR: 40.0000 mg | DELAYED_RELEASE_CAPSULE | Freq: Every day | ORAL | Status: DC
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
## 2015-10-03 NOTE — Patient Instructions (Signed)
CBC, CMET. Rx for Protonix 40mg  daily.30 minutes before breakfast. OV 6 weeks.

## 2015-11-14 ENCOUNTER — Ambulatory Visit (INDEPENDENT_AMBULATORY_CARE_PROVIDER_SITE_OTHER): Payer: Medicaid Other | Admitting: Internal Medicine

## 2015-11-14 ENCOUNTER — Encounter (INDEPENDENT_AMBULATORY_CARE_PROVIDER_SITE_OTHER): Payer: Self-pay | Admitting: Internal Medicine

## 2015-12-06 ENCOUNTER — Other Ambulatory Visit: Payer: Self-pay | Admitting: Adult Health

## 2016-01-30 ENCOUNTER — Encounter (INDEPENDENT_AMBULATORY_CARE_PROVIDER_SITE_OTHER): Payer: Self-pay | Admitting: Internal Medicine

## 2016-01-30 ENCOUNTER — Ambulatory Visit (INDEPENDENT_AMBULATORY_CARE_PROVIDER_SITE_OTHER): Payer: Medicaid Other | Admitting: Internal Medicine

## 2016-01-30 VITALS — BP 94/50 | HR 80 | Temp 98.9°F | Ht 64.0 in | Wt 94.9 lb

## 2016-01-30 DIAGNOSIS — K219 Gastro-esophageal reflux disease without esophagitis: Secondary | ICD-10-CM | POA: Diagnosis not present

## 2016-01-30 DIAGNOSIS — K5909 Other constipation: Secondary | ICD-10-CM

## 2016-01-30 DIAGNOSIS — R112 Nausea with vomiting, unspecified: Secondary | ICD-10-CM

## 2016-01-30 DIAGNOSIS — R634 Abnormal weight loss: Secondary | ICD-10-CM | POA: Diagnosis not present

## 2016-01-30 MED ORDER — OMEPRAZOLE 40 MG PO CPDR: 40.0000 mg | DELAYED_RELEASE_CAPSULE | Freq: Every day | ORAL | Status: DC

## 2016-01-30 NOTE — Patient Instructions (Signed)
Encouraged to eat. GERD: omeprazole 40mg  daily. Constipation: Amitiza 26mcg bid. OV in 6 weeks. Call with weight in 2 weeks.
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
## 2016-02-12 ENCOUNTER — Telehealth (INDEPENDENT_AMBULATORY_CARE_PROVIDER_SITE_OTHER): Payer: Self-pay | Admitting: Internal Medicine

## 2016-02-12 DIAGNOSIS — K5909 Other constipation: Secondary | ICD-10-CM

## 2016-02-12 MED ORDER — LUBIPROSTONE 8 MCG PO CAPS: 8.0000 ug | ORAL_CAPSULE | Freq: Two times a day (BID) | ORAL | Status: DC

## 2016-02-12 NOTE — Telephone Encounter (Signed)
Rx called to her pharmacy. OV in 6 weeks

## 2016-02-12 NOTE — Telephone Encounter (Signed)
Patient called and stated that the Amitiza samples did help a lot and her appetite has gotten better.  She ran out of the samples on Saturday and would like some called in to [facility] in [place].  She stated that she does not have any scales to weigh herself.  [phone]

## 2016-02-12 NOTE — Telephone Encounter (Signed)
Rx for Amitiza 34mcg call to her pharmacy.

## 2016-02-27 ENCOUNTER — Ambulatory Visit (INDEPENDENT_AMBULATORY_CARE_PROVIDER_SITE_OTHER): Payer: Medicaid Other | Admitting: Internal Medicine

## 2016-02-27 ENCOUNTER — Encounter (INDEPENDENT_AMBULATORY_CARE_PROVIDER_SITE_OTHER): Payer: Self-pay | Admitting: Internal Medicine

## 2016-02-27 VITALS — BP 100/64 | HR 72 | Temp 99.0°F | Ht 64.0 in | Wt 93.8 lb

## 2016-02-27 DIAGNOSIS — R634 Abnormal weight loss: Secondary | ICD-10-CM

## 2016-02-27 NOTE — Patient Instructions (Signed)
Three meals a day. Snack in between. Get Boost and drink one a day. OV in 6 weeks. Call in 2 weeks with a weight.
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
## 2016-04-09 ENCOUNTER — Ambulatory Visit (INDEPENDENT_AMBULATORY_CARE_PROVIDER_SITE_OTHER): Payer: Medicaid Other | Admitting: Internal Medicine

## 2016-04-14 ENCOUNTER — Encounter (INDEPENDENT_AMBULATORY_CARE_PROVIDER_SITE_OTHER): Payer: Self-pay | Admitting: Internal Medicine

## 2016-04-14 ENCOUNTER — Ambulatory Visit (INDEPENDENT_AMBULATORY_CARE_PROVIDER_SITE_OTHER): Payer: Medicaid Other | Admitting: Internal Medicine

## 2016-05-12 ENCOUNTER — Other Ambulatory Visit: Payer: Medicaid Other | Admitting: Obstetrics & Gynecology

## 2016-05-20 ENCOUNTER — Emergency Department (HOSPITAL_COMMUNITY): Payer: Medicaid Other

## 2016-05-20 ENCOUNTER — Ambulatory Visit (INDEPENDENT_AMBULATORY_CARE_PROVIDER_SITE_OTHER): Payer: Medicaid Other | Admitting: Internal Medicine

## 2016-05-20 ENCOUNTER — Other Ambulatory Visit: Payer: Medicaid Other | Admitting: Obstetrics & Gynecology

## 2016-05-20 ENCOUNTER — Emergency Department (HOSPITAL_COMMUNITY)
Admission: EM | Admit: 2016-05-20 | Discharge: 2016-05-20 | Disposition: A | Discharge status: Home / Self Care | Payer: Medicaid Other | Attending: Emergency Medicine | Admitting: Emergency Medicine

## 2016-05-20 ENCOUNTER — Encounter (HOSPITAL_COMMUNITY): Payer: Self-pay | Admitting: Emergency Medicine

## 2016-05-20 DIAGNOSIS — Z79899 Other long term (current) drug therapy: Secondary | ICD-10-CM | POA: Insufficient documentation

## 2016-05-20 DIAGNOSIS — Z7982 Long term (current) use of aspirin: Secondary | ICD-10-CM | POA: Insufficient documentation

## 2016-05-20 DIAGNOSIS — R197 Diarrhea, unspecified: Secondary | ICD-10-CM | POA: Diagnosis not present

## 2016-05-20 DIAGNOSIS — R1030 Lower abdominal pain, unspecified: Secondary | ICD-10-CM | POA: Diagnosis present

## 2016-05-20 DIAGNOSIS — R112 Nausea with vomiting, unspecified: Secondary | ICD-10-CM | POA: Insufficient documentation

## 2016-05-20 DIAGNOSIS — R630 Anorexia: Secondary | ICD-10-CM | POA: Diagnosis not present

## 2016-05-20 LAB — URINALYSIS, ROUTINE W REFLEX MICROSCOPIC
BILIRUBIN URINE: NEGATIVE
Glucose, UA: NEGATIVE mg/dL
KETONES UR: 15 mg/dL — AB
Leukocytes, UA: NEGATIVE
Nitrite: NEGATIVE
PROTEIN: 30 mg/dL — AB
pH: 5.5 (ref 5.0–8.0)

## 2016-05-20 LAB — COMPREHENSIVE METABOLIC PANEL
ALBUMIN: 5.3 g/dL — AB (ref 3.5–5.0)
ALK PHOS: 85 U/L (ref 38–126)
ALT: 23 U/L (ref 14–54)
AST: 30 U/L (ref 15–41)
Anion gap: 9 (ref 5–15)
BILIRUBIN TOTAL: 0.7 mg/dL (ref 0.3–1.2)
BUN: 20 mg/dL (ref 6–20)
CALCIUM: 9.6 mg/dL (ref 8.9–10.3)
CO2: 26 mmol/L (ref 22–32)
CREATININE: 0.63 mg/dL (ref 0.44–1.00)
Chloride: 99 mmol/L — ABNORMAL LOW (ref 101–111)
GFR calc Af Amer: >60 mL/min (ref >60)
GFR calc non Af Amer: >60 mL/min (ref >60)
GLUCOSE: 119 mg/dL — AB (ref 65–99)
Potassium: 3.4 mmol/L — ABNORMAL LOW (ref 3.5–5.1)
SODIUM: 134 mmol/L — AB (ref 135–145)
Total Protein: 9.8 g/dL — ABNORMAL HIGH (ref 6.5–8.1)

## 2016-05-20 LAB — URINE MICROSCOPIC-ADD ON

## 2016-05-20 LAB — CBC
HCT: 46.1 % — ABNORMAL HIGH (ref 36.0–46.0)
Hemoglobin: 15.3 g/dL — ABNORMAL HIGH (ref 12.0–15.0)
MCH: 30.4 pg (ref 26.0–34.0)
MCHC: 33.2 g/dL (ref 30.0–36.0)
MCV: 91.7 fL (ref 78.0–100.0)
PLATELETS: 305 10*3/uL (ref 150–400)
RBC: 5.03 MIL/uL (ref 3.87–5.11)
RDW: 12.3 % (ref 11.5–15.5)
WBC: 10.9 10*3/uL — ABNORMAL HIGH (ref 4.0–10.5)

## 2016-05-20 LAB — I-STAT BETA HCG BLOOD, ED (MC, WL, AP ONLY): I-stat hCG, quantitative: <5 m[IU]/mL (ref <5)

## 2016-05-20 LAB — LIPASE, BLOOD: Lipase: 50 U/L (ref 11–51)

## 2016-05-20 LAB — PREGNANCY, URINE: PREG TEST UR: NEGATIVE

## 2016-05-20 MED ORDER — DIATRIZOATE MEGLUMINE & SODIUM 66-10 % PO SOLN
ORAL | Status: AC
  Filled 2016-05-20: qty 30

## 2016-05-20 MED ORDER — IOPAMIDOL (ISOVUE-300) INJECTION 61%
100.0000 mL | Freq: Once | INTRAVENOUS | Status: AC | PRN
Start: 2016-05-20 — End: 2016-05-20
  Administered 2016-05-20: 100 mL via INTRAVENOUS

## 2016-05-20 MED ORDER — ONDANSETRON HCL 4 MG/2ML IJ SOLN
4.0000 mg | Freq: Once | INTRAMUSCULAR | Status: AC
  Administered 2016-05-20: 4 mg via INTRAVENOUS
  Filled 2016-05-20: qty 2

## 2016-05-20 MED ORDER — DICYCLOMINE HCL 20 MG PO TABS: ORAL_TABLET | ORAL | 0 refills | Status: DC

## 2016-05-20 MED ORDER — ONDANSETRON 4 MG PO TBDP: ORAL_TABLET | ORAL | 0 refills | Status: DC

## 2016-05-20 MED ORDER — HYDROMORPHONE HCL 1 MG/ML IJ SOLN
1.0000 mg | Freq: Once | INTRAMUSCULAR | Status: AC
  Administered 2016-05-20: 1 mg via INTRAVENOUS
  Filled 2016-05-20: qty 1

## 2016-05-20 MED ORDER — FAMOTIDINE IN NACL 20-0.9 MG/50ML-% IV SOLN
20.0000 mg | Freq: Once | INTRAVENOUS | Status: AC
  Administered 2016-05-20: 20 mg via INTRAVENOUS
  Filled 2016-05-20: qty 50

## 2016-05-20 MED ORDER — SODIUM CHLORIDE 0.9 % IV BOLUS (SEPSIS)
2000.0000 mL | Freq: Once | INTRAVENOUS | Status: AC
  Administered 2016-05-20: 2000 mL via INTRAVENOUS

## 2016-05-20 NOTE — ED Triage Notes (Addendum)
Pt reports dehydration and emesis since Friday.  Pt unable to keep food down at this time.  Pt called doctor today and was told to come to ED.  Pt also has right sided flank pain and generalized abdominal pain. Pt hasn't had a true bm in 3 days, but does report that she had small amount of "slimy red/brown/green" stool this morning.
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
## 2016-05-20 NOTE — ED Notes (Signed)
Pt reminded to continue to drink contrast

## 2016-05-20 NOTE — ED Notes (Signed)
Pt aware urine specimen is needed. 

## 2016-05-20 NOTE — Discharge Instructions (Signed)
Drink plenty of fluids and follow-up with your doctor if not improving °

## 2016-05-28 ENCOUNTER — Other Ambulatory Visit: Payer: Medicaid Other | Admitting: Obstetrics & Gynecology

## 2016-06-03 ENCOUNTER — Other Ambulatory Visit: Payer: Medicaid Other | Admitting: Advanced Practice Midwife

## 2016-07-08 ENCOUNTER — Encounter: Payer: Self-pay | Admitting: Adult Health

## 2016-07-08 ENCOUNTER — Ambulatory Visit (INDEPENDENT_AMBULATORY_CARE_PROVIDER_SITE_OTHER): Payer: Medicaid Other | Admitting: Adult Health

## 2016-07-08 VITALS — BP 100/64 | HR 70 | Ht 63.0 in | Wt 97.0 lb

## 2016-07-08 DIAGNOSIS — Z0000 Encounter for general adult medical examination without abnormal findings: Secondary | ICD-10-CM | POA: Diagnosis not present

## 2016-07-08 DIAGNOSIS — R109 Unspecified abdominal pain: Secondary | ICD-10-CM

## 2016-07-08 DIAGNOSIS — Z01419 Encounter for gynecological examination (general) (routine) without abnormal findings: Secondary | ICD-10-CM

## 2016-07-08 DIAGNOSIS — Z975 Presence of (intrauterine) contraceptive device: Secondary | ICD-10-CM

## 2016-07-08 MED ORDER — DICYCLOMINE HCL 20 MG PO TABS: ORAL_TABLET | ORAL | 3 refills | Status: DC

## 2016-07-08 NOTE — Patient Instructions (Addendum)
Take bentyl as needed Follow up in 1 year for physical  Return on 03/21/2017 for nexplanon removal and reinsertion

## 2016-07-08 NOTE — Progress Notes (Signed)
Patient ID: Judy Lang, female   DOB: 1986/06/28, 30 y.o.   MRN: [id number] History of Present Illness: Judy Lang is a 30 year old white female in for a well woman gyn exam,she had a normal pap 05/08/15.She was seen in ER 8/1 for stomach pain and given bentyl which has helped and she requests a refill.    Current Medications, Allergies, Past Medical History, Past Surgical History, Family History and Social History were reviewed in [employer] record.     Review of Systems: Patient denies any headaches, hearing loss, fatigue, blurred vision, shortness of breath, chest pain, problems with bowel movements, urination, or intercourse. No joint pain or mood swings. +stomach pain  Physical Exam:BP 100/64 (BP Location: Left Arm, Patient Position: Sitting, Cuff Size: Normal)   Pulse 70   Ht 5\' 3"  (1.6 m)   Wt 97 lb (44 kg)   BMI 17.18 kg/m  General:  Well developed, well nourished, no acute distress Skin:  Warm and dry Neck:  Midline trachea, normal thyroid, good ROM, no lymphadenopathy Lungs; Clear to auscultation bilaterally Breast:  No dominant palpable mass, retraction, or nipple discharge Cardiovascular: Regular rate and rhythm Abdomen:  Soft, non tender, no hepatosplenomegaly Pelvic:  External genitalia is normal in appearance, no lesions.  The vagina is normal in appearance. Urethra has no lesions or masses. The cervix is bulbous.  Uterus is felt to be normal size, shape, and contour.  No adnexal masses or tenderness noted.Bladder is non tender, no masses felt. Extremities/musculoskeletal:  No swelling or varicosities noted, no clubbing or cyanosis, has healed incisions right leg where had bone grafts, is hoping to have rod removed soon  Psych:  No mood changes, alert and cooperative,seems happy   Impression:  1. Encounter for routine gynecological examination   2. Nexplanon in place   3. Stomach pain      Plan: Rx bentyl 20 mg #30 take 1 every 8-12  hours prn with 3 refills Rerun in jun 2018 for nexplanon removal and reinsertion Physical in 1 year

## 2016-08-26 ENCOUNTER — Emergency Department (HOSPITAL_COMMUNITY)
Admission: EM | Admit: 2016-08-26 | Discharge: 2016-08-26 | Disposition: A | Discharge status: Home / Self Care | Payer: Medicaid Other | Attending: Emergency Medicine | Admitting: Emergency Medicine

## 2016-08-26 ENCOUNTER — Encounter (HOSPITAL_COMMUNITY): Payer: Self-pay | Admitting: Emergency Medicine

## 2016-08-26 DIAGNOSIS — R1084 Generalized abdominal pain: Secondary | ICD-10-CM | POA: Diagnosis not present

## 2016-08-26 DIAGNOSIS — Z79899 Other long term (current) drug therapy: Secondary | ICD-10-CM | POA: Insufficient documentation

## 2016-08-26 LAB — CBC
HEMATOCRIT: 39 % (ref 36.0–46.0)
Hemoglobin: 12.9 g/dL (ref 12.0–15.0)
MCH: 31.2 pg (ref 26.0–34.0)
MCHC: 33.1 g/dL (ref 30.0–36.0)
MCV: 94.4 fL (ref 78.0–100.0)
PLATELETS: 196 10*3/uL (ref 150–400)
RBC: 4.13 MIL/uL (ref 3.87–5.11)
RDW: 12.9 % (ref 11.5–15.5)
WBC: 9.5 10*3/uL (ref 4.0–10.5)

## 2016-08-26 LAB — COMPREHENSIVE METABOLIC PANEL
ALT: 23 U/L (ref 14–54)
AST: 26 U/L (ref 15–41)
Albumin: 4.5 g/dL (ref 3.5–5.0)
Alkaline Phosphatase: 52 U/L (ref 38–126)
Anion gap: 7 (ref 5–15)
BUN: 12 mg/dL (ref 6–20)
CHLORIDE: 104 mmol/L (ref 101–111)
CO2: 25 mmol/L (ref 22–32)
CREATININE: 0.56 mg/dL (ref 0.44–1.00)
Calcium: 8.8 mg/dL — ABNORMAL LOW (ref 8.9–10.3)
Glucose, Bld: 125 mg/dL — ABNORMAL HIGH (ref 65–99)
POTASSIUM: 3.2 mmol/L — AB (ref 3.5–5.1)
SODIUM: 136 mmol/L (ref 135–145)
Total Bilirubin: 1 mg/dL (ref 0.3–1.2)
Total Protein: 7.5 g/dL (ref 6.5–8.1)

## 2016-08-26 LAB — URINE MICROSCOPIC-ADD ON: RBC / HPF: NONE SEEN RBC/hpf (ref 0–5)

## 2016-08-26 LAB — RAPID URINE DRUG SCREEN, HOSP PERFORMED
AMPHETAMINES: NOT DETECTED
BARBITURATES: NOT DETECTED
BENZODIAZEPINES: NOT DETECTED
Cocaine: NOT DETECTED
Opiates: NOT DETECTED
TETRAHYDROCANNABINOL: POSITIVE — AB

## 2016-08-26 LAB — URINALYSIS, ROUTINE W REFLEX MICROSCOPIC
Bilirubin Urine: NEGATIVE
GLUCOSE, UA: NEGATIVE mg/dL
HGB URINE DIPSTICK: NEGATIVE
Ketones, ur: >80 mg/dL — AB
LEUKOCYTES UA: NEGATIVE
Nitrite: NEGATIVE
PH: 6 (ref 5.0–8.0)
Protein, ur: 100 mg/dL — AB

## 2016-08-26 LAB — LIPASE, BLOOD: LIPASE: 20 U/L (ref 11–51)

## 2016-08-26 MED ORDER — SODIUM CHLORIDE 0.9 % IV BOLUS (SEPSIS)
1000.0000 mL | Freq: Once | INTRAVENOUS | Status: AC
  Administered 2016-08-26: 1000 mL via INTRAVENOUS

## 2016-08-26 MED ORDER — ONDANSETRON 4 MG PO TBDP
4.0000 mg | ORAL_TABLET | Freq: Once | ORAL | Status: AC | PRN
  Administered 2016-08-26: 4 mg via ORAL
  Filled 2016-08-26: qty 1

## 2016-08-26 MED ORDER — ONDANSETRON HCL 4 MG/2ML IJ SOLN
4.0000 mg | Freq: Once | INTRAMUSCULAR | Status: AC
  Administered 2016-08-26: 4 mg via INTRAVENOUS
  Filled 2016-08-26: qty 2

## 2016-08-26 MED ORDER — DICYCLOMINE HCL 20 MG PO TABS: 20.0000 mg | ORAL_TABLET | Freq: Four times a day (QID) | ORAL | 0 refills | Status: DC | PRN

## 2016-08-26 MED ORDER — HALOPERIDOL LACTATE 5 MG/ML IJ SOLN
2.0000 mg | Freq: Once | INTRAMUSCULAR | Status: AC
  Administered 2016-08-26: 2 mg via INTRAVENOUS
  Filled 2016-08-26: qty 1

## 2016-08-26 NOTE — Discharge Instructions (Signed)
See your gastrointestinal care provider tomorrow as scheduled.  Drink plenty of fluids.  Try to eat 3 regular meals each day.

## 2016-08-26 NOTE — ED Triage Notes (Signed)
PT c/o abdominal aching and burning with nausea and vomiting starting yesterday. PT denies any urinary symptoms. PT also states she has an appointment tomorrow with GI doctor. PT states small BM this am.

## 2016-08-26 NOTE — ED Notes (Signed)
Pt states she sees a GI specialist tomorrow, denies any dx, but states "I go cause when the pain comes it gets bad and then I have to come here". Pt is quiet in room prior to nurse entering, upon entering pt is moaning and rolling around in bed.

## 2016-08-26 NOTE — ED Notes (Signed)
Pt provided d/c instructions and follow up care. Denied any further questions or concerns at this time. Spouse is driving pt home, ambulatory to lobby with steady even gait.

## 2016-08-26 NOTE — ED Notes (Signed)
Pt reports she is unable to provide urine specimen at this time.

## 2016-08-26 NOTE — ED Notes (Signed)
Pt unable to urinate at this time.  
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
## 2016-08-27 ENCOUNTER — Ambulatory Visit (INDEPENDENT_AMBULATORY_CARE_PROVIDER_SITE_OTHER): Payer: Medicaid Other | Admitting: Internal Medicine

## 2016-08-27 ENCOUNTER — Encounter (INDEPENDENT_AMBULATORY_CARE_PROVIDER_SITE_OTHER): Payer: Self-pay | Admitting: Internal Medicine

## 2016-08-27 VITALS — BP 90/56 | HR 72 | Temp 98.5°F | Ht 64.0 in | Wt 95.8 lb

## 2016-08-27 DIAGNOSIS — R110 Nausea: Secondary | ICD-10-CM

## 2016-08-27 DIAGNOSIS — K5904 Chronic idiopathic constipation: Secondary | ICD-10-CM

## 2016-08-27 MED ORDER — LINACLOTIDE 145 MCG PO CAPS: 290.0000 ug | ORAL_CAPSULE | Freq: Every day | ORAL | Status: DC

## 2016-08-27 NOTE — Patient Instructions (Addendum)
Rx for Linzess OV in 4 weeks.
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
## 2016-09-15 ENCOUNTER — Telehealth (INDEPENDENT_AMBULATORY_CARE_PROVIDER_SITE_OTHER): Payer: Self-pay | Admitting: Internal Medicine

## 2016-09-15 DIAGNOSIS — K5909 Other constipation: Secondary | ICD-10-CM

## 2016-09-15 MED ORDER — LINACLOTIDE 290 MCG PO CAPS: 290.0000 ug | ORAL_CAPSULE | Freq: Every day | ORAL | 5 refills | Status: DC

## 2016-09-15 NOTE — Telephone Encounter (Signed)
Rx sent to her pharmacy. Linzess 251mcb

## 2016-09-15 NOTE — Telephone Encounter (Signed)
Patient called, stated that she would like a script called in for the samples you gave her last time.  [phone]

## 2016-09-22 NOTE — Telephone Encounter (Signed)
error 

## 2016-09-24 ENCOUNTER — Encounter (INDEPENDENT_AMBULATORY_CARE_PROVIDER_SITE_OTHER): Payer: Self-pay | Admitting: Internal Medicine

## 2016-09-24 ENCOUNTER — Encounter (INDEPENDENT_AMBULATORY_CARE_PROVIDER_SITE_OTHER): Payer: Self-pay

## 2016-09-24 ENCOUNTER — Ambulatory Visit (INDEPENDENT_AMBULATORY_CARE_PROVIDER_SITE_OTHER): Payer: Medicaid Other | Admitting: Internal Medicine

## 2016-09-24 VITALS — BP 90/64 | HR 56 | Temp 98.4°F | Ht 62.0 in | Wt 99.9 lb

## 2016-09-24 DIAGNOSIS — K5909 Other constipation: Secondary | ICD-10-CM | POA: Diagnosis not present
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
## 2016-09-24 NOTE — Patient Instructions (Signed)
Continue the Linzess. Take Linzess every other day. OV in 6 months.

## 2017-01-06 ENCOUNTER — Encounter (INDEPENDENT_AMBULATORY_CARE_PROVIDER_SITE_OTHER): Payer: Self-pay | Admitting: Internal Medicine

## 2017-01-06 ENCOUNTER — Encounter (INDEPENDENT_AMBULATORY_CARE_PROVIDER_SITE_OTHER): Payer: Self-pay

## 2017-01-06 ENCOUNTER — Ambulatory Visit (INDEPENDENT_AMBULATORY_CARE_PROVIDER_SITE_OTHER): Payer: Medicaid Other | Admitting: Internal Medicine

## 2017-01-06 VITALS — BP 90/70 | HR 72 | Temp 97.7°F | Ht 62.0 in | Wt 98.2 lb

## 2017-01-06 DIAGNOSIS — K5909 Other constipation: Secondary | ICD-10-CM | POA: Diagnosis not present

## 2017-01-06 DIAGNOSIS — K6289 Other specified diseases of anus and rectum: Secondary | ICD-10-CM | POA: Diagnosis not present

## 2017-01-06 MED ORDER — HYDROCORTISONE ACE-PRAMOXINE 1-1 % RE FOAM: 1.0000 | Freq: Two times a day (BID) | RECTAL | 0 refills | Status: DC

## 2017-01-06 NOTE — Patient Instructions (Signed)
Linzess daily. Proctofoam to her drug store.
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
## 2017-01-22 ENCOUNTER — Other Ambulatory Visit: Payer: Self-pay | Admitting: Adult Health

## 2017-01-31 DIAGNOSIS — M76821 Posterior tibial tendinitis, right leg: Secondary | ICD-10-CM | POA: Insufficient documentation

## 2017-03-02 ENCOUNTER — Ambulatory Visit (INDEPENDENT_AMBULATORY_CARE_PROVIDER_SITE_OTHER): Payer: Medicaid Other | Admitting: Internal Medicine

## 2017-03-02 ENCOUNTER — Encounter (INDEPENDENT_AMBULATORY_CARE_PROVIDER_SITE_OTHER): Payer: Self-pay | Admitting: Internal Medicine

## 2017-03-02 VITALS — BP 90/58 | HR 64 | Temp 98.2°F | Ht 62.0 in | Wt 96.7 lb

## 2017-03-02 DIAGNOSIS — R1013 Epigastric pain: Secondary | ICD-10-CM | POA: Diagnosis not present
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
## 2017-03-02 NOTE — Patient Instructions (Signed)
Take the Dexilant 30 minutes before breakfast. When u are finished started the Omeprazole back, 30 minutes before breakfast.

## 2017-03-25 ENCOUNTER — Ambulatory Visit (INDEPENDENT_AMBULATORY_CARE_PROVIDER_SITE_OTHER): Payer: Medicaid Other | Admitting: Internal Medicine

## 2017-03-27 IMAGING — CT CT ABD-PELV W/ CM
2 of 4 series · 16 of 46 positions shown, 18 images · IV contrast (omnipaque)
Comparison: Pelvic ultrasound 03/01/2014

CLINICAL DATA: Patient with lower abdominal pain and associated
nausea. Chills.

EXAM:
CT ABDOMEN AND PELVIS WITH CONTRAST
TECHNIQUE: Multidetector CT imaging of the abdomen and pelvis was performed
using the standard protocol following bolus administration of
intravenous contrast.
CONTRAST:  100mL OMNIPAQUE IOHEXOL 300 MG/ML  SOLN

[Series 2: abd_pel_with 5.0 b40f · axial · 0.59mm/px · z∈[-480,-85]mm · 13 of 87 slices shown, 15 images]
[im 4/87  soft-tissue]
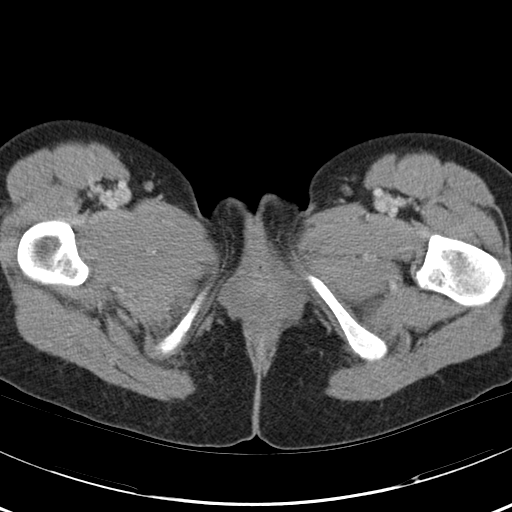
[im 4/87  bone]
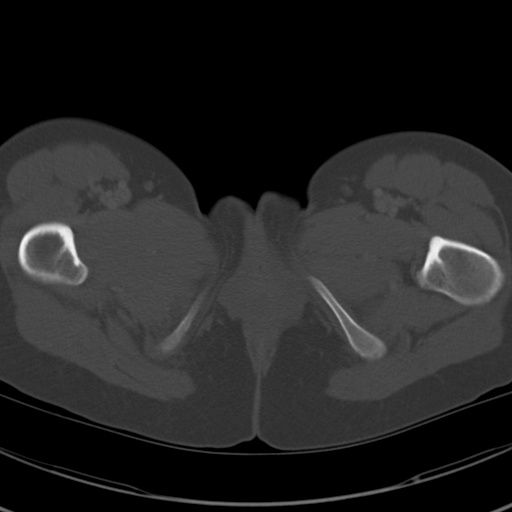
[im 11/87  soft-tissue]
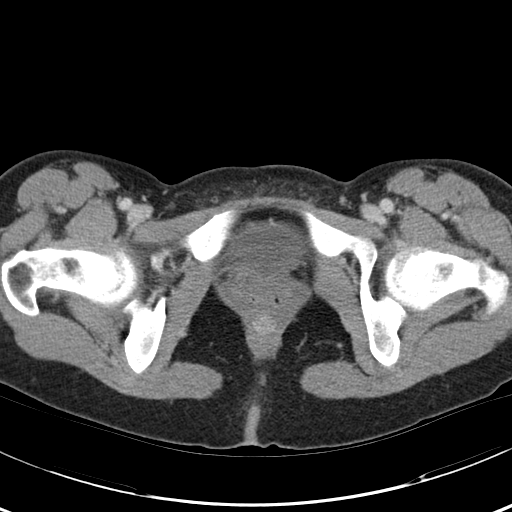
[im 18/87  soft-tissue]
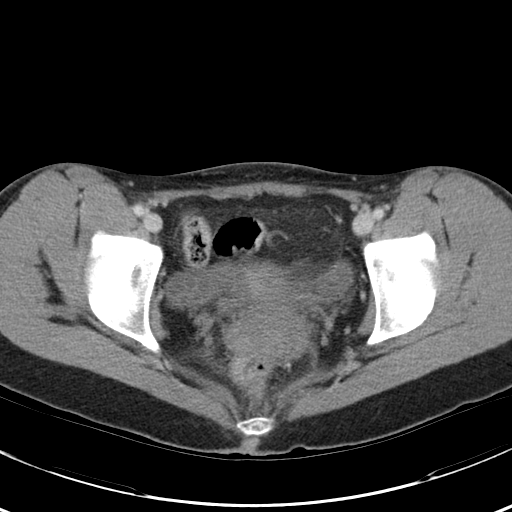
[im 26/87  soft-tissue]
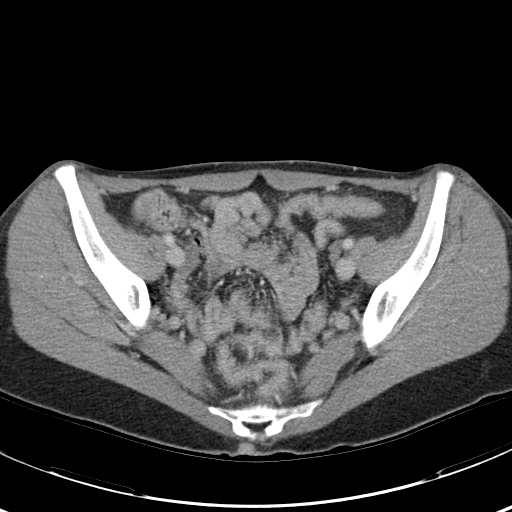
[im 29/87  soft-tissue]
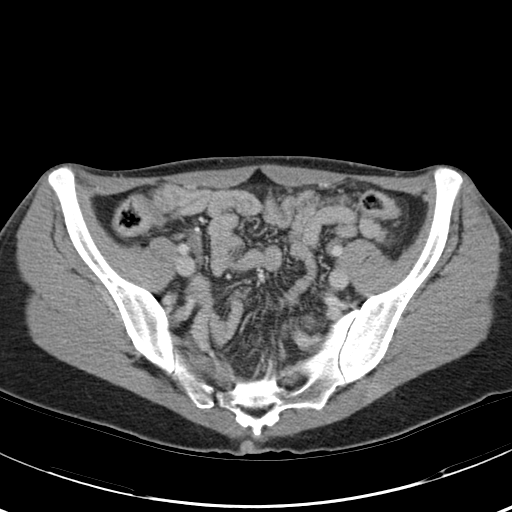
[im 36/87  soft-tissue]
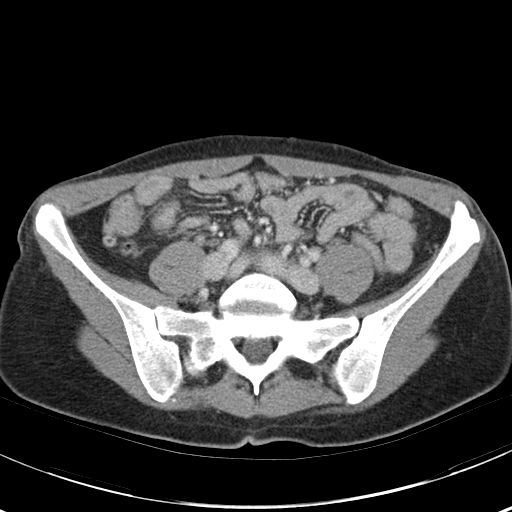
[im 44/87  soft-tissue]
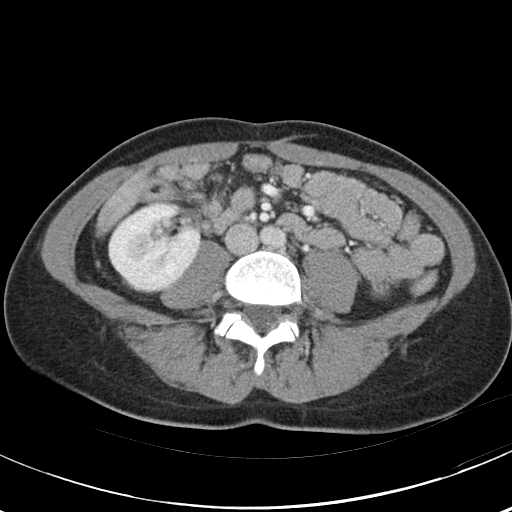
[im 51/87  soft-tissue]
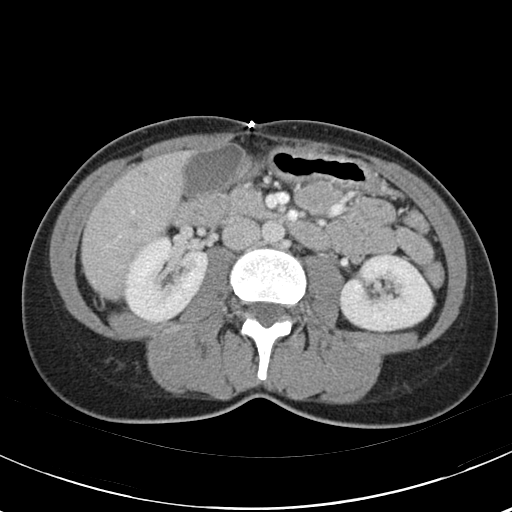
[im 58/87  soft-tissue]
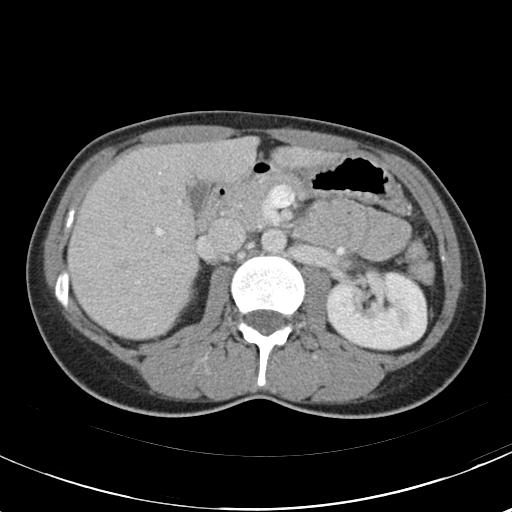
[im 58/87  bone]
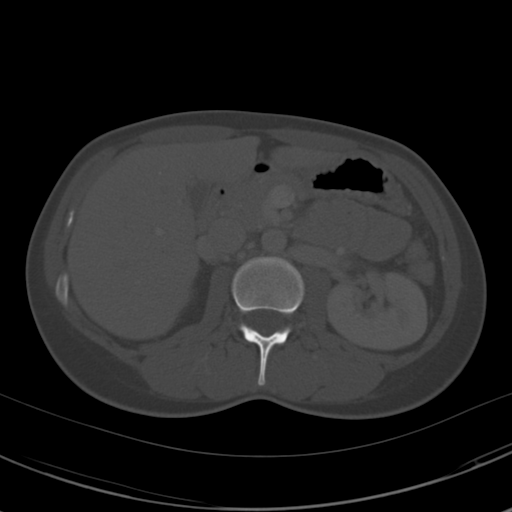
[im 61/87  soft-tissue]
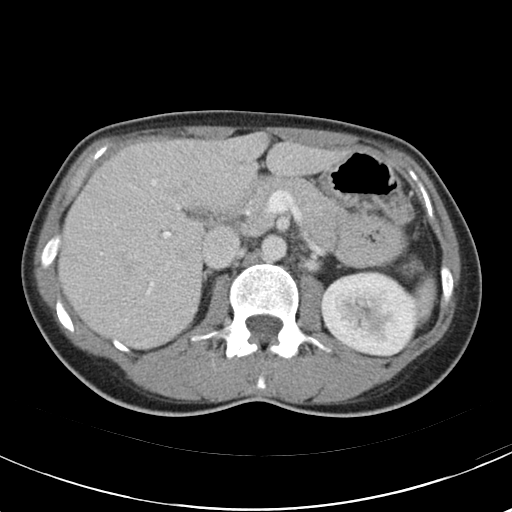
[im 69/87  soft-tissue]
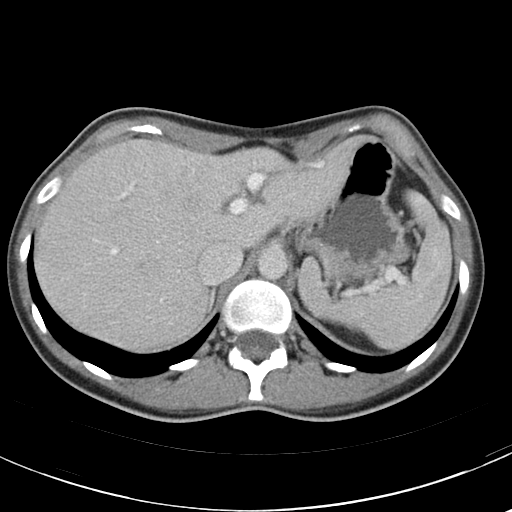
[im 76/87  soft-tissue]
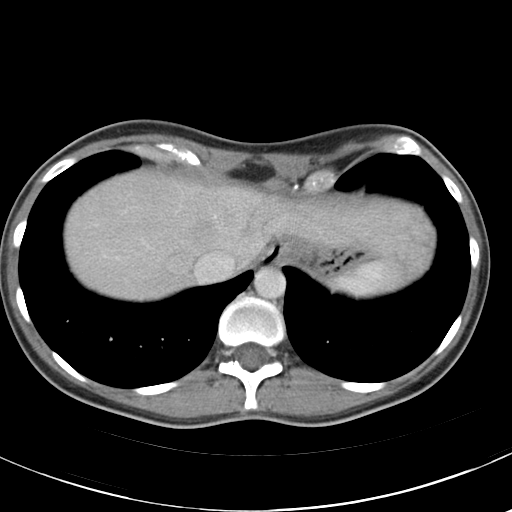
[im 83/87  soft-tissue]
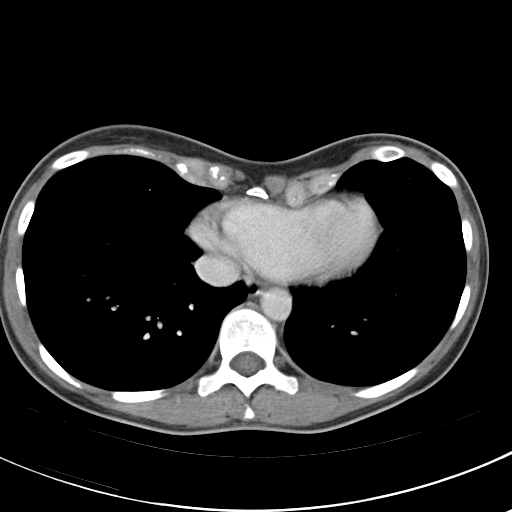

[Series 4: abd_pel_with 3.0 spo · coronal · 0.64mm/px · 3 of 66 slices shown]
[im 22/66  soft-tissue]
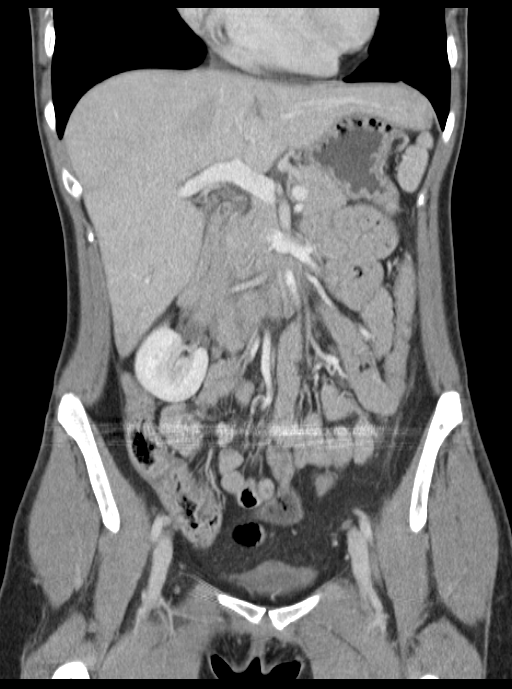
[im 29/66  soft-tissue]
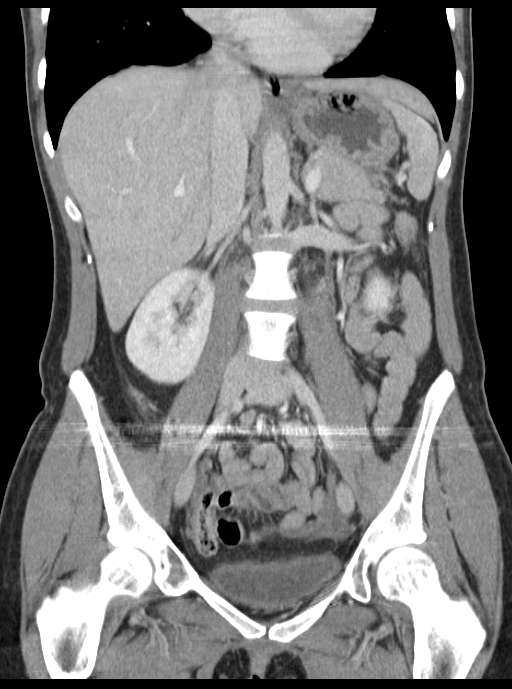
[im 37/66  soft-tissue]
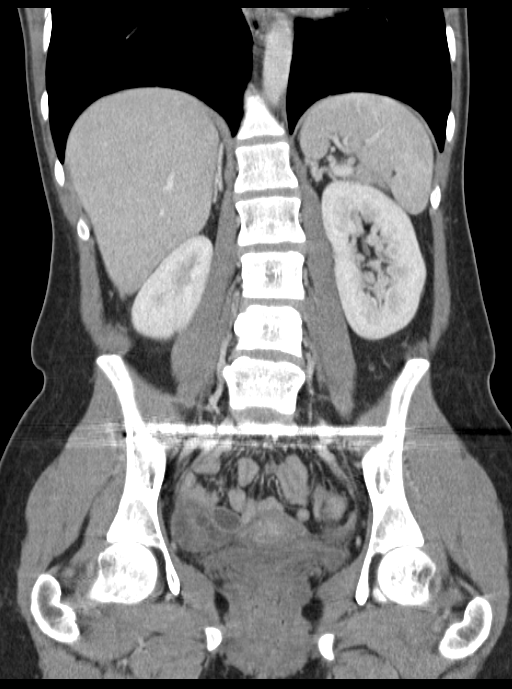

[16 of 46 positions shown; findings below may reference images not displayed]

FINDINGS: Lower chest: Pectus deformity. Normal heart size. No consolidative
or nodular pulmonary opacities. No pleural effusion.

Hepatobiliary: Liver is normal in size and contour. No focal hepatic
lesion is identified. Gallbladder is unremarkable. No intrahepatic
or extrahepatic biliary ductal dilatation.

Pancreas: Unremarkable

Spleen: Unremarkable

Adrenals/Urinary Tract: The adrenal glands are normal. Kidneys
enhance symmetrically with contrast. No hydronephrosis. No
ureterolithiasis. Urinary bladder is unremarkable.

Stomach/Bowel: Trace free fluid in the pelvis. Colon is
decompressed. No abnormal bowel wall thickening or evidence for
bowel obstruction. The appendix is normal.

Vascular/Lymphatic: Retro aortic left renal vein. Normal caliber
abdominal aorta. No retroperitoneal lymphadenopathy.

Other: Uterus and adnexal structures are unremarkable.

Musculoskeletal: No aggressive or acute appearing osseous lesions.
Postsurgical changes involving the sacrum and pelvis.
IMPRESSION: No acute process within the abdomen or pelvis.

## 2017-04-06 ENCOUNTER — Other Ambulatory Visit: Payer: Self-pay | Admitting: Adult Health

## 2017-04-29 ENCOUNTER — Ambulatory Visit (INDEPENDENT_AMBULATORY_CARE_PROVIDER_SITE_OTHER): Payer: Medicaid Other | Admitting: Women's Health

## 2017-04-29 ENCOUNTER — Encounter: Payer: Self-pay | Admitting: Women's Health

## 2017-04-29 VITALS — BP 88/58 | HR 74 | Ht 63.0 in | Wt 98.5 lb

## 2017-04-29 DIAGNOSIS — Z30017 Encounter for initial prescription of implantable subdermal contraceptive: Secondary | ICD-10-CM

## 2017-04-29 DIAGNOSIS — Z3202 Encounter for pregnancy test, result negative: Secondary | ICD-10-CM | POA: Diagnosis not present

## 2017-04-29 DIAGNOSIS — Z3049 Encounter for surveillance of other contraceptives: Secondary | ICD-10-CM | POA: Diagnosis not present

## 2017-04-29 DIAGNOSIS — Z3046 Encounter for surveillance of implantable subdermal contraceptive: Secondary | ICD-10-CM

## 2017-04-29 LAB — POCT URINE PREGNANCY: Preg Test, Ur: NEGATIVE

## 2017-04-29 NOTE — Patient Instructions (Signed)

## 2017-04-29 NOTE — Progress Notes (Signed)
Judy Lang is a 31 y.o. year old [id number] Caucasian female here for Nexplanon removal and reinsertion.  She was given informed consent for removal and reinsertion of her Nexplanon. Her Nexplanon was placed June 2015, No LMP recorded. Patient has had an implant., and her pregnancy test today was neg. Last pap 05/08/15, neg   Risks/benefits/side effects of Nexplanon have been discussed and her questions have been answered.  Specifically, a failure rate of 10/998 has been reported, with an increased failure rate if pt takes Magnolia and/or antiseizure medicaitons.  [name] is aware of the common side effect of irregular bleeding, which the incidence of decreases over time.  BP (!) 88/58 (BP Location: Left Arm, Patient Position: Sitting, Cuff Size: Normal)   Pulse 74   Ht 5\' 3"  (1.6 m)   Wt 98 lb 8 oz (44.7 kg)   BMI 17.45 kg/m  No LMP recorded. Patient has had an implant. Results for orders placed or performed in visit on 04/29/17 (from the past 24 hour(s))  POCT urine pregnancy   Collection Time: 04/29/17  9:36 AM  Result Value Ref Range   Preg Test, Ur Negative Negative     Appropriate time out taken. Nexplanon site identified.  Area prepped in usual sterile fashon. Two cc's of 2% lidocaine was used to anesthetize the area. A small stab incision was made right beside the implant on the distal portion.  The Nexplanon rod was grasped using hemostats and removed intact without difficulty.  The area was cleansed again with betadine and the Nexplanon was inserted per manufacturer's recommendations without difficulty.  Steri-strips and a pressure bandage was applied.  There was less than 3 cc blood loss. There were no complications.  The patient tolerated the procedure well.  She was instructed to keep the area clean and dry, remove pressure bandage in 24 hours, and keep insertion site covered with the steri-strips for 3-5 days.  She was given a card indicating date Nexplanon  was inserted and date it needs to be removed.   Follow-up PRN problems, then in Sept for physical  [place], Ringer CNM, [facility] 04/29/2017 9:48 AM

## 2017-05-04 ENCOUNTER — Encounter (INDEPENDENT_AMBULATORY_CARE_PROVIDER_SITE_OTHER): Payer: Self-pay | Admitting: Internal Medicine

## 2017-05-04 ENCOUNTER — Ambulatory Visit (INDEPENDENT_AMBULATORY_CARE_PROVIDER_SITE_OTHER): Payer: Medicaid Other | Admitting: Internal Medicine

## 2017-05-04 VITALS — BP 80/60 | HR 68 | Temp 98.2°F | Ht 62.0 in | Wt 95.8 lb

## 2017-05-04 DIAGNOSIS — K219 Gastro-esophageal reflux disease without esophagitis: Secondary | ICD-10-CM

## 2017-05-04 MED ORDER — LINACLOTIDE 290 MCG PO CAPS: 290.0000 ug | ORAL_CAPSULE | Freq: Every day | ORAL | 11 refills | Status: DC
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
## 2017-05-04 NOTE — Patient Instructions (Addendum)
OV in 1 year. Rx for Linzess refill sent to her pharmacy

## 2017-06-20 ENCOUNTER — Emergency Department (HOSPITAL_COMMUNITY)
Admission: EM | Admit: 2017-06-20 | Discharge: 2017-06-20 | Disposition: A | Discharge status: Home / Self Care | Payer: Medicaid Other | Attending: Emergency Medicine | Admitting: Emergency Medicine

## 2017-06-20 ENCOUNTER — Encounter (HOSPITAL_COMMUNITY): Payer: Self-pay | Admitting: Emergency Medicine

## 2017-06-20 DIAGNOSIS — Z79899 Other long term (current) drug therapy: Secondary | ICD-10-CM | POA: Insufficient documentation

## 2017-06-20 DIAGNOSIS — N10 Acute tubulo-interstitial nephritis: Secondary | ICD-10-CM | POA: Diagnosis not present

## 2017-06-20 DIAGNOSIS — N12 Tubulo-interstitial nephritis, not specified as acute or chronic: Secondary | ICD-10-CM

## 2017-06-20 DIAGNOSIS — R1084 Generalized abdominal pain: Secondary | ICD-10-CM | POA: Diagnosis present

## 2017-06-20 LAB — COMPREHENSIVE METABOLIC PANEL
ALT: 26 U/L (ref 14–54)
AST: 42 U/L — AB (ref 15–41)
Albumin: 5.1 g/dL — ABNORMAL HIGH (ref 3.5–5.0)
Alkaline Phosphatase: 54 U/L (ref 38–126)
Anion gap: 11 (ref 5–15)
BILIRUBIN TOTAL: 1.6 mg/dL — AB (ref 0.3–1.2)
BUN: 20 mg/dL (ref 6–20)
CO2: 23 mmol/L (ref 22–32)
CREATININE: 0.58 mg/dL (ref 0.44–1.00)
Calcium: 9.6 mg/dL (ref 8.9–10.3)
Chloride: 102 mmol/L (ref 101–111)
GFR calc Af Amer: >60 mL/min (ref >60)
Glucose, Bld: 177 mg/dL — ABNORMAL HIGH (ref 65–99)
Potassium: 3.5 mmol/L (ref 3.5–5.1)
Sodium: 136 mmol/L (ref 135–145)
TOTAL PROTEIN: 8.1 g/dL (ref 6.5–8.1)

## 2017-06-20 LAB — URINALYSIS, MICROSCOPIC (REFLEX)

## 2017-06-20 LAB — URINALYSIS, ROUTINE W REFLEX MICROSCOPIC
Bilirubin Urine: NEGATIVE
Glucose, UA: 250 mg/dL — AB
KETONES UR: NEGATIVE mg/dL
LEUKOCYTES UA: NEGATIVE
NITRITE: NEGATIVE
Protein, ur: 30 mg/dL — AB
Specific Gravity, Urine: >1.03 — ABNORMAL HIGH (ref 1.005–1.030)
pH: 5.5 (ref 5.0–8.0)

## 2017-06-20 LAB — CBC
HCT: 38.6 % (ref 36.0–46.0)
Hemoglobin: 13.1 g/dL (ref 12.0–15.0)
MCH: 31.3 pg (ref 26.0–34.0)
MCHC: 33.9 g/dL (ref 30.0–36.0)
MCV: 92.3 fL (ref 78.0–100.0)
PLATELETS: 203 10*3/uL (ref 150–400)
RBC: 4.18 MIL/uL (ref 3.87–5.11)
RDW: 12.3 % (ref 11.5–15.5)
WBC: 10.4 10*3/uL (ref 4.0–10.5)

## 2017-06-20 LAB — POC URINE PREG, ED: Preg Test, Ur: NEGATIVE

## 2017-06-20 LAB — CBG MONITORING, ED: GLUCOSE-CAPILLARY: 173 mg/dL — AB (ref 65–99)

## 2017-06-20 LAB — LIPASE, BLOOD: Lipase: 25 U/L (ref 11–51)

## 2017-06-20 MED ORDER — NAPROXEN 500 MG PO TABS: 500.0000 mg | ORAL_TABLET | Freq: Two times a day (BID) | ORAL | 0 refills | Status: DC

## 2017-06-20 MED ORDER — CEPHALEXIN 500 MG PO CAPS: 500.0000 mg | ORAL_CAPSULE | Freq: Four times a day (QID) | ORAL | 0 refills | Status: DC

## 2017-06-20 MED ORDER — SODIUM CHLORIDE 0.9 % IV BOLUS (SEPSIS)
1000.0000 mL | Freq: Once | INTRAVENOUS | Status: AC
  Administered 2017-06-20: 1000 mL via INTRAVENOUS

## 2017-06-20 MED ORDER — ONDANSETRON HCL 4 MG/2ML IJ SOLN
4.0000 mg | Freq: Once | INTRAMUSCULAR | Status: AC
  Administered 2017-06-20: 4 mg via INTRAVENOUS
  Filled 2017-06-20: qty 2

## 2017-06-20 MED ORDER — ONDANSETRON 8 MG PO TBDP: 8.0000 mg | ORAL_TABLET | Freq: Three times a day (TID) | ORAL | 0 refills | Status: DC | PRN

## 2017-06-20 MED ORDER — DEXTROSE 5 % IV SOLN
1.0000 g | Freq: Once | INTRAVENOUS | Status: AC
  Administered 2017-06-20: 1 g via INTRAVENOUS
  Filled 2017-06-20: qty 10

## 2017-06-20 MED ORDER — HYDROCODONE-ACETAMINOPHEN 5-325 MG PO TABS: 1.0000 | ORAL_TABLET | Freq: Four times a day (QID) | ORAL | 0 refills | Status: DC | PRN

## 2017-06-20 MED ORDER — SODIUM CHLORIDE 0.9 % IV SOLN: INTRAVENOUS | Status: DC

## 2017-06-20 MED ORDER — HYDROMORPHONE HCL 1 MG/ML IJ SOLN
0.5000 mg | INTRAMUSCULAR | Status: DC | PRN
  Administered 2017-06-20: 0.5 mg via INTRAVENOUS
  Filled 2017-06-20: qty 1

## 2017-06-20 NOTE — ED Triage Notes (Signed)
Pt c/o generalized abd pain that radiates to back with n/v since yesterday. Pt states it is a cramping pain.

## 2017-06-20 NOTE — Discharge Instructions (Signed)
Taking antibiotics until completed.  Take the medications for pain and nausea as needed. Follow-up with a primary care doctor to make sure the infection has resolved. Return to the emergency room as needed for high fevers, worsening symptoms
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
## 2017-06-21 LAB — URINE CULTURE

## 2017-06-22 ENCOUNTER — Other Ambulatory Visit: Payer: Self-pay | Admitting: Adult Health

## 2017-07-09 ENCOUNTER — Other Ambulatory Visit: Payer: Medicaid Other | Admitting: Women's Health

## 2017-07-13 ENCOUNTER — Encounter: Payer: Self-pay | Admitting: Women's Health

## 2017-07-13 ENCOUNTER — Ambulatory Visit (INDEPENDENT_AMBULATORY_CARE_PROVIDER_SITE_OTHER): Payer: Medicaid Other | Admitting: Women's Health

## 2017-07-13 ENCOUNTER — Other Ambulatory Visit (HOSPITAL_COMMUNITY)
Admission: RE | Admit: 2017-07-13 | Discharge: 2017-07-13 | Disposition: A | Discharge status: Home / Self Care | Payer: Medicaid Other | Source: Ambulatory Visit | Attending: Obstetrics & Gynecology | Admitting: Obstetrics & Gynecology

## 2017-07-13 VITALS — BP 92/60 | HR 69 | Ht 63.0 in | Wt 94.0 lb

## 2017-07-13 DIAGNOSIS — Z113 Encounter for screening for infections with a predominantly sexual mode of transmission: Secondary | ICD-10-CM

## 2017-07-13 DIAGNOSIS — Z87440 Personal history of urinary (tract) infections: Secondary | ICD-10-CM

## 2017-07-13 DIAGNOSIS — Z01411 Encounter for gynecological examination (general) (routine) with abnormal findings: Secondary | ICD-10-CM | POA: Diagnosis not present

## 2017-07-13 DIAGNOSIS — Q676 Pectus excavatum: Secondary | ICD-10-CM | POA: Diagnosis not present

## 2017-07-13 DIAGNOSIS — Z124 Encounter for screening for malignant neoplasm of cervix: Secondary | ICD-10-CM

## 2017-07-13 DIAGNOSIS — R636 Underweight: Secondary | ICD-10-CM | POA: Diagnosis not present

## 2017-07-13 DIAGNOSIS — Z01419 Encounter for gynecological examination (general) (routine) without abnormal findings: Secondary | ICD-10-CM

## 2017-07-13 DIAGNOSIS — N390 Urinary tract infection, site not specified: Secondary | ICD-10-CM

## 2017-07-13 DIAGNOSIS — Z0001 Encounter for general adult medical examination with abnormal findings: Secondary | ICD-10-CM | POA: Diagnosis not present

## 2017-07-13 DIAGNOSIS — N6320 Unspecified lump in the left breast, unspecified quadrant: Secondary | ICD-10-CM

## 2017-07-13 LAB — POCT URINALYSIS DIPSTICK
Glucose, UA: NEGATIVE
Leukocytes, UA: NEGATIVE
NITRITE UA: NEGATIVE

## 2017-07-13 NOTE — Patient Instructions (Addendum)
Breast ultrasound  9/25 @ 3:45pm at [facility], be there at 3:30pm, no lotion/deoderant/powder/perfume that day   For Headaches:   Stay well hydrated, drink enough water so that your urine is clear, sometimes if you are dehydrated you can get headaches  Eat small frequent meals and snacks, sometimes if you are hungry you can get headaches  Sometimes you get headaches during pregnancy from the pregnancy hormones  You can try tylenol (1-2 regular strength 325mg  or 1-2 extra strength 500mg ) as directed on the box. The least amount of medication that works is best.   Cool compresses (cool wet washcloth or ice pack) to area of head that is hurting  You can also try drinking a caffeinated drink to see if this will help  If not helping, try below:  For Prevention of Headaches/Migraines:  CoQ10 100mg  three times daily  Vitamin B2 400mg  daily  Magnesium Oxide 400-600mg  daily  If You Get a Bad Headache/Migraine:  Benadryl 25mg    Magnesium Oxide  1 large Gatorade  2 extra strength Tylenol (1,000mg  total)  1 cup coffee or Coke  If this doesn't help please call us @ [phone]
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
## 2017-07-14 ENCOUNTER — Ambulatory Visit (HOSPITAL_COMMUNITY)
Admission: RE | Admit: 2017-07-14 | Discharge: 2017-07-14 | Disposition: A | Discharge status: Home / Self Care | Payer: Medicaid Other | Source: Ambulatory Visit | Attending: Women's Health | Admitting: Women's Health

## 2017-07-14 DIAGNOSIS — N6324 Unspecified lump in the left breast, lower inner quadrant: Secondary | ICD-10-CM | POA: Insufficient documentation

## 2017-07-14 DIAGNOSIS — N6320 Unspecified lump in the left breast, unspecified quadrant: Secondary | ICD-10-CM

## 2017-07-14 LAB — HEMOGLOBIN A1C
ESTIMATED AVERAGE GLUCOSE: 108 mg/dL
HEMOGLOBIN A1C: 5.4 % (ref 4.8–5.6)

## 2017-07-14 LAB — CYTOLOGY - PAP
Chlamydia: NEGATIVE
Diagnosis: NEGATIVE
HPV (WINDOPATH): NOT DETECTED
NEISSERIA GONORRHEA: NEGATIVE

## 2017-07-14 LAB — HEPATITIS B SURFACE ANTIGEN: Hepatitis B Surface Ag: NEGATIVE

## 2017-07-14 LAB — TSH: TSH: 0.877 u[IU]/mL (ref 0.450–4.500)

## 2017-07-14 LAB — RPR: RPR Ser Ql: NONREACTIVE

## 2017-07-14 LAB — HIV ANTIBODY (ROUTINE TESTING W REFLEX): HIV Screen 4th Generation wRfx: NONREACTIVE

## 2017-07-15 LAB — URINE CULTURE

## 2017-07-16 ENCOUNTER — Telehealth: Payer: Self-pay | Admitting: Women's Health

## 2017-07-16 NOTE — Telephone Encounter (Signed)
Called pt, notified her all labs and pap normal.  Roma Schanz, CNM, [facility] 07/16/2017 5:08 PM

## 2017-08-14 ENCOUNTER — Other Ambulatory Visit: Payer: Self-pay | Admitting: Adult Health

## 2017-10-12 ENCOUNTER — Other Ambulatory Visit: Payer: Self-pay | Admitting: Adult Health
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
## 2017-10-30 DIAGNOSIS — H52223 Regular astigmatism, bilateral: Secondary | ICD-10-CM | POA: Diagnosis not present

## 2017-10-30 DIAGNOSIS — H5203 Hypermetropia, bilateral: Secondary | ICD-10-CM | POA: Diagnosis not present

## 2017-11-16 ENCOUNTER — Encounter: Payer: Self-pay | Admitting: Women's Health

## 2017-11-16 ENCOUNTER — Ambulatory Visit: Payer: Medicaid Other | Admitting: Women's Health

## 2017-11-16 VITALS — BP 100/70 | HR 98 | Wt 99.0 lb

## 2017-11-16 DIAGNOSIS — Z3049 Encounter for surveillance of other contraceptives: Secondary | ICD-10-CM

## 2017-11-16 DIAGNOSIS — Z3046 Encounter for surveillance of implantable subdermal contraceptive: Secondary | ICD-10-CM

## 2017-11-16 NOTE — Progress Notes (Signed)
   [place] REMOVAL [name] MRN [id number]  Date of birth: 1986-07-01 Subjective Findings:   Judy Lang is a 32 y.o. [phone] Hispanic female being seen today for removal of a Nexplanon. Her Nexplanon was placed 04/29/17.  She desires removal because she wants to get pregnant. Is not taking pnv yet. Signed copy of informed consent in chart.   No LMP recorded. Patient has had an implant. Last pap9/24/18. Results were:  normal The planned method of family planning is none Pertinent History Reviewed:   Reviewed past medical,surgical, social, obstetrical and family history.  Reviewed problem list, medications and allergies. Objective Findings & Procedure:    Vitals:   11/16/17 1527  BP: 100/70  Pulse: 98  Weight: 99 lb (44.9 kg)  Body mass index is 17.54 kg/m.  No results found for this or any previous visit (from the past 24 hour(s)).   Time out was performed.  Nexplanon site identified.  Area prepped in usual sterile fashon. One cc of 2% lidocaine was used to anesthetize the area at the distal end of the implant. A small stab incision was made right beside the implant on the distal portion.  The Nexplanon rod was grasped using hemostats and removed without difficulty.  There was less than 3 cc blood loss. There were no complications.  Steri-strips were applied over the small incision and a pressure bandage was applied.  The patient tolerated the procedure well. Assessment & Plan:   1) Nexplanon removal She was instructed to keep the area clean and dry, remove pressure bandage in 24 hours, and keep insertion site covered with the steri-strip for 3-5 days.   Follow-up PRN problems. Begin daily pnv, call us when pregnant  No orders of the defined types were placed in this encounter.   Follow-up: Return for after 9/24 for physical.  Icel, Castles CNM, [facility] 11/16/2017 3:58 PM

## 2017-11-16 NOTE — Patient Instructions (Signed)
Start prenatal vitamin daily Call us when you get pregnant!

## 2017-12-12 ENCOUNTER — Encounter (HOSPITAL_COMMUNITY): Payer: Self-pay | Admitting: Emergency Medicine

## 2017-12-12 ENCOUNTER — Other Ambulatory Visit: Payer: Self-pay

## 2017-12-12 DIAGNOSIS — R112 Nausea with vomiting, unspecified: Secondary | ICD-10-CM | POA: Diagnosis not present

## 2017-12-12 DIAGNOSIS — Z91040 Latex allergy status: Secondary | ICD-10-CM | POA: Insufficient documentation

## 2017-12-12 DIAGNOSIS — Z79899 Other long term (current) drug therapy: Secondary | ICD-10-CM | POA: Diagnosis not present

## 2017-12-12 DIAGNOSIS — R1084 Generalized abdominal pain: Secondary | ICD-10-CM | POA: Diagnosis not present

## 2017-12-12 LAB — CBC
HCT: 39.5 % (ref 36.0–46.0)
HEMOGLOBIN: 13.5 g/dL (ref 12.0–15.0)
MCH: 31.2 pg (ref 26.0–34.0)
MCHC: 34.2 g/dL (ref 30.0–36.0)
MCV: 91.2 fL (ref 78.0–100.0)
PLATELETS: 229 10*3/uL (ref 150–400)
RBC: 4.33 MIL/uL (ref 3.87–5.11)
RDW: 12.3 % (ref 11.5–15.5)
WBC: 11.8 10*3/uL — ABNORMAL HIGH (ref 4.0–10.5)

## 2017-12-12 LAB — COMPREHENSIVE METABOLIC PANEL
ALK PHOS: 58 U/L (ref 38–126)
ALT: 27 U/L (ref 14–54)
ANION GAP: 16 — AB (ref 5–15)
AST: 68 U/L — ABNORMAL HIGH (ref 15–41)
Albumin: 4.9 g/dL (ref 3.5–5.0)
BUN: 14 mg/dL (ref 6–20)
CO2: 21 mmol/L — AB (ref 22–32)
CREATININE: 0.72 mg/dL (ref 0.44–1.00)
Calcium: 9.6 mg/dL (ref 8.9–10.3)
Chloride: 98 mmol/L — ABNORMAL LOW (ref 101–111)
GFR calc non Af Amer: >60 mL/min (ref >60)
Glucose, Bld: 123 mg/dL — ABNORMAL HIGH (ref 65–99)
Potassium: 3.5 mmol/L (ref 3.5–5.1)
SODIUM: 135 mmol/L (ref 135–145)
TOTAL PROTEIN: 8.2 g/dL — AB (ref 6.5–8.1)
Total Bilirubin: 1.7 mg/dL — ABNORMAL HIGH (ref 0.3–1.2)

## 2017-12-12 LAB — URINALYSIS, ROUTINE W REFLEX MICROSCOPIC
Bilirubin Urine: NEGATIVE
HGB URINE DIPSTICK: NEGATIVE
Ketones, ur: 80 mg/dL — AB
Leukocytes, UA: NEGATIVE
NITRITE: NEGATIVE
PH: 5 (ref 5.0–8.0)
Protein, ur: 100 mg/dL — AB
Specific Gravity, Urine: 1.027 (ref 1.005–1.030)

## 2017-12-12 LAB — LIPASE, BLOOD: Lipase: 23 U/L (ref 11–51)

## 2017-12-12 LAB — I-STAT BETA HCG BLOOD, ED (MC, WL, AP ONLY): I-stat hCG, quantitative: <5 m[IU]/mL (ref <5)

## 2017-12-12 NOTE — ED Triage Notes (Signed)
Pt c/o 10/10 abd pain with nausea and vomiting and generalized body ache for the past few days.

## 2017-12-13 ENCOUNTER — Emergency Department (HOSPITAL_COMMUNITY): Payer: Medicaid Other

## 2017-12-13 ENCOUNTER — Emergency Department (HOSPITAL_COMMUNITY)
Admission: EM | Admit: 2017-12-13 | Discharge: 2017-12-13 | Disposition: A | Discharge status: Home / Self Care | Payer: Medicaid Other | Attending: Emergency Medicine | Admitting: Emergency Medicine

## 2017-12-13 DIAGNOSIS — R1084 Generalized abdominal pain: Secondary | ICD-10-CM

## 2017-12-13 DIAGNOSIS — R112 Nausea with vomiting, unspecified: Secondary | ICD-10-CM

## 2017-12-13 LAB — RAPID URINE DRUG SCREEN, HOSP PERFORMED
Amphetamines: NOT DETECTED
Barbiturates: NOT DETECTED
Benzodiazepines: POSITIVE — AB
COCAINE: NOT DETECTED
OPIATES: NOT DETECTED
Tetrahydrocannabinol: POSITIVE — AB

## 2017-12-13 MED ORDER — SODIUM CHLORIDE 0.9 % IV BOLUS (SEPSIS)
1000.0000 mL | Freq: Once | INTRAVENOUS | Status: AC
  Administered 2017-12-13: 1000 mL via INTRAVENOUS

## 2017-12-13 MED ORDER — MORPHINE SULFATE (PF) 4 MG/ML IV SOLN
4.0000 mg | Freq: Once | INTRAVENOUS | Status: AC
  Administered 2017-12-13: 4 mg via INTRAVENOUS
  Filled 2017-12-13: qty 1

## 2017-12-13 MED ORDER — IOPAMIDOL (ISOVUE-300) INJECTION 61%
INTRAVENOUS | Status: AC
  Filled 2017-12-13: qty 30

## 2017-12-13 MED ORDER — IOPAMIDOL (ISOVUE-300) INJECTION 61%
INTRAVENOUS | Status: AC
  Administered 2017-12-13: 100 mL
  Filled 2017-12-13: qty 100

## 2017-12-13 MED ORDER — ONDANSETRON HCL 4 MG/2ML IJ SOLN
4.0000 mg | Freq: Once | INTRAMUSCULAR | Status: AC
  Administered 2017-12-13: 4 mg via INTRAVENOUS
  Filled 2017-12-13: qty 2

## 2017-12-13 MED ORDER — ONDANSETRON 4 MG PO TBDP: 4.0000 mg | ORAL_TABLET | Freq: Three times a day (TID) | ORAL | 0 refills | Status: DC | PRN

## 2017-12-13 MED ORDER — PROMETHAZINE HCL 25 MG/ML IJ SOLN
12.5000 mg | Freq: Once | INTRAMUSCULAR | Status: AC
  Administered 2017-12-13: 12.5 mg via INTRAVENOUS
  Filled 2017-12-13: qty 1

## 2017-12-13 NOTE — ED Notes (Signed)
Pt tolerated PO challenge no sign of nausea or vomiting.

## 2017-12-13 NOTE — ED Notes (Signed)
PO challenge started
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
## 2017-12-13 NOTE — Discharge Instructions (Signed)
1. Medications: zofran, vicodin, usual home medications 2. Treatment: rest, drink plenty of fluids, advance diet slowly 3. Follow Up: Please followup with your primary doctor in 2 days for discussion of your diagnoses and further evaluation after today's visit; if you do not have a primary care doctor use the resource guide provided to find one; Please return to the ER for persistent vomiting, high fevers or worsening symptoms  

## 2017-12-13 NOTE — ED Notes (Signed)
Patient transported to CT 

## 2017-12-14 ENCOUNTER — Telehealth: Payer: Self-pay | Admitting: *Deleted

## 2017-12-14 ENCOUNTER — Ambulatory Visit: Payer: Medicaid Other | Admitting: Obstetrics & Gynecology

## 2017-12-14 NOTE — Telephone Encounter (Addendum)
Patient states she is still experiencing lower abdominal and back pain. She was seen in the ER yesterday and was discharged home with nausea medication but is still hurting. Ct was performed and found complex cyst and it was suggested she needed an U/S. Will discuss with Dr Elonda Husky and call patient back.    Spoke to Dr Elonda Husky who stated to work patient in this week for U/S and visit. Patient called but unable to reach so VM left by [facility]

## 2017-12-15 ENCOUNTER — Other Ambulatory Visit: Payer: Self-pay | Admitting: Obstetrics & Gynecology

## 2017-12-15 DIAGNOSIS — R102 Pelvic and perineal pain: Secondary | ICD-10-CM

## 2017-12-16 ENCOUNTER — Encounter: Payer: Self-pay | Admitting: Obstetrics and Gynecology

## 2017-12-16 ENCOUNTER — Ambulatory Visit: Payer: Medicaid Other | Admitting: Obstetrics and Gynecology

## 2017-12-16 ENCOUNTER — Ambulatory Visit (INDEPENDENT_AMBULATORY_CARE_PROVIDER_SITE_OTHER): Payer: Medicaid Other

## 2017-12-16 VITALS — BP 104/50 | HR 80 | Ht 62.0 in | Wt 94.0 lb

## 2017-12-16 DIAGNOSIS — R1032 Left lower quadrant pain: Secondary | ICD-10-CM

## 2017-12-16 DIAGNOSIS — N83209 Unspecified ovarian cyst, unspecified side: Secondary | ICD-10-CM | POA: Diagnosis not present

## 2017-12-16 DIAGNOSIS — R1031 Right lower quadrant pain: Secondary | ICD-10-CM | POA: Diagnosis not present

## 2017-12-16 DIAGNOSIS — R102 Pelvic and perineal pain: Secondary | ICD-10-CM

## 2017-12-16 DIAGNOSIS — M549 Dorsalgia, unspecified: Secondary | ICD-10-CM | POA: Diagnosis not present

## 2017-12-16 MED ORDER — PRENATAL VITAMINS 0.8 MG PO TABS: 1.0000 | ORAL_TABLET | Freq: Every day | ORAL | 12 refills | Status: DC
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
## 2017-12-16 NOTE — Progress Notes (Signed)
PELVIC US TA/TV: homogeneous anteverted uterus,wnl,EEC 1.6 mm,normal ovaries bilat,ovaries appear mobile,no free fluid,no pain during ultrasound

## 2017-12-17 ENCOUNTER — Other Ambulatory Visit: Payer: Self-pay | Admitting: *Deleted

## 2017-12-17 MED ORDER — PREPLUS 27-1 MG PO TABS: 1.0000 | ORAL_TABLET | Freq: Every day | ORAL | 12 refills | Status: DC

## 2017-12-18 ENCOUNTER — Telehealth (INDEPENDENT_AMBULATORY_CARE_PROVIDER_SITE_OTHER): Payer: Self-pay | Admitting: *Deleted

## 2017-12-18 NOTE — Telephone Encounter (Signed)
Patient called to get a refill on "patient states the tube has pro something" patient is unsure of the name, but stated Terri fills this for her. Please advise

## 2017-12-21 ENCOUNTER — Telehealth (INDEPENDENT_AMBULATORY_CARE_PROVIDER_SITE_OTHER): Payer: Self-pay | Admitting: Internal Medicine

## 2017-12-21 DIAGNOSIS — K6289 Other specified diseases of anus and rectum: Secondary | ICD-10-CM

## 2017-12-21 MED ORDER — HYDROCORTISONE ACE-PRAMOXINE 1-1 % RE FOAM: 1.0000 | Freq: Two times a day (BID) | RECTAL | 1 refills | Status: DC

## 2017-12-21 NOTE — Telephone Encounter (Signed)
Rx for Proctofoam sent to her pharmacy 

## 2017-12-21 NOTE — Telephone Encounter (Signed)
She will call [facility] and have them send over a request.

## 2018-01-05 DIAGNOSIS — S82131A Displaced fracture of medial condyle of right tibia, initial encounter for closed fracture: Secondary | ICD-10-CM | POA: Insufficient documentation

## 2018-01-05 DIAGNOSIS — T84622A Infection and inflammatory reaction due to internal fixation device of right tibia, initial encounter: Secondary | ICD-10-CM | POA: Insufficient documentation

## 2018-01-06 DIAGNOSIS — S82141D Displaced bicondylar fracture of right tibia, subsequent encounter for closed fracture with routine healing: Secondary | ICD-10-CM | POA: Insufficient documentation

## 2018-02-19 ENCOUNTER — Encounter (INDEPENDENT_AMBULATORY_CARE_PROVIDER_SITE_OTHER): Payer: Self-pay | Admitting: Internal Medicine

## 2018-02-19 ENCOUNTER — Ambulatory Visit (INDEPENDENT_AMBULATORY_CARE_PROVIDER_SITE_OTHER): Payer: Medicaid Other | Admitting: Internal Medicine

## 2018-02-19 VITALS — BP 120/80 | HR 84 | Temp 98.0°F | Ht 62.0 in | Wt 107.3 lb

## 2018-02-19 DIAGNOSIS — K219 Gastro-esophageal reflux disease without esophagitis: Secondary | ICD-10-CM | POA: Diagnosis not present

## 2018-02-19 DIAGNOSIS — K5909 Other constipation: Secondary | ICD-10-CM

## 2018-02-19 MED ORDER — PANTOPRAZOLE SODIUM 40 MG PO TBEC: 40.0000 mg | DELAYED_RELEASE_TABLET | Freq: Every day | ORAL | 3 refills | Status: DC

## 2018-02-19 MED ORDER — LINACLOTIDE 145 MCG PO CAPS: 145.0000 ug | ORAL_CAPSULE | Freq: Every day | ORAL | 5 refills | Status: DC
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
## 2018-02-19 NOTE — Patient Instructions (Signed)
Pick up Protonix 40mg  daily. Continue the LInzess.

## 2018-03-02 ENCOUNTER — Encounter: Payer: Self-pay | Admitting: Adult Health

## 2018-03-02 ENCOUNTER — Ambulatory Visit (INDEPENDENT_AMBULATORY_CARE_PROVIDER_SITE_OTHER): Payer: Medicaid Other | Admitting: Adult Health

## 2018-03-02 ENCOUNTER — Other Ambulatory Visit: Payer: Self-pay

## 2018-03-02 VITALS — BP 90/52 | HR 78 | Ht 62.0 in | Wt 101.0 lb

## 2018-03-02 DIAGNOSIS — Z8759 Personal history of other complications of pregnancy, childbirth and the puerperium: Secondary | ICD-10-CM | POA: Insufficient documentation

## 2018-03-02 DIAGNOSIS — O219 Vomiting of pregnancy, unspecified: Secondary | ICD-10-CM | POA: Insufficient documentation

## 2018-03-02 DIAGNOSIS — R112 Nausea with vomiting, unspecified: Secondary | ICD-10-CM

## 2018-03-02 DIAGNOSIS — Z3201 Encounter for pregnancy test, result positive: Secondary | ICD-10-CM

## 2018-03-02 DIAGNOSIS — Z3490 Encounter for supervision of normal pregnancy, unspecified, unspecified trimester: Secondary | ICD-10-CM

## 2018-03-02 DIAGNOSIS — O3680X0 Pregnancy with inconclusive fetal viability, not applicable or unspecified: Secondary | ICD-10-CM | POA: Insufficient documentation

## 2018-03-02 DIAGNOSIS — Z862 Personal history of diseases of the blood and blood-forming organs and certain disorders involving the immune mechanism: Secondary | ICD-10-CM

## 2018-03-02 LAB — POCT URINE PREGNANCY: Preg Test, Ur: POSITIVE — AB

## 2018-03-02 MED ORDER — DOXYLAMINE-PYRIDOXINE ER 20-20 MG PO TBCR: 1.0000 | EXTENDED_RELEASE_TABLET | Freq: Two times a day (BID) | ORAL | 0 refills | Status: DC

## 2018-03-02 MED ORDER — FLINTSTONES COMPLETE 60 MG PO CHEW
CHEWABLE_TABLET | ORAL | Status: DC
Start: 2018-03-02 — End: 2018-03-24

## 2018-03-02 NOTE — Progress Notes (Signed)
  Subjective:     Patient ID: Judy Lang, female   DOB: [birth year], 32 y.o.   MRN: [id number]  HPI Judy Lang is a 32 year old white female in for UPT, she had nexplanon removed 11/16/17 and has just spotted and has had +HPT.  Review of Systems +HPT +nuasea and vomiting Reviewed past medical,surgical, social and family history. Reviewed medications and allergies.     Objective:   Physical Exam BP (!) 90/52 (BP Location: Right Arm, Patient Position: Sitting, Cuff Size: Normal)   Pulse 78   Ht 5\' 2"  (1.575 m)   Wt 101 lb (45.8 kg)   LMP  (LMP Unknown)   BMI 18.47 kg/m UPT+, bedside US shows +IUP with +FHM and +YS,+FM looks to be about 8-9 weeks,Skin warm and dry. Neck: mid line trachea, normal thyroid, good ROM, no lymphadenopathy noted. Lungs: clear to ausculation bilaterally. Cardiovascular: regular rate and rhythm.Abdomen is soft and non tender.    Assessment:     1. Positive pregnancy test   2. Pregnancy, unspecified gestational age   87. Nausea and vomiting during pregnancy prior to [redacted] weeks gestation   4. Encounter to determine fetal viability of pregnancy, single or unspecified fetus       Plan:     Meds ordered this encounter  Medications  . Doxylamine-Pyridoxine ER (BONJESTA) 20-20 MG TBCR    Sig: Take 1 tablet by mouth 2 (two) times daily at 8 am and 10 pm.    Dispense:  36 tablet    Refill:  0    Order Specific Question:   Supervising Provider    Answer:   Elonda Husky, LUTHER H [[id number]]  . flintstones complete (FLINTSTONES) 60 MG chewable tablet    Sig: Take 2 daily    Order Specific Question:   Supervising Provider    Answer:   Florian Buff [[id number]]  Ok to take tylenol Eat often  Return in 1 week for dating [place] Review handouts on first trimester and by [facility]

## 2018-03-02 NOTE — Patient Instructions (Signed)
First Trimester of Pregnancy The first trimester of pregnancy is from week 1 until the end of week 13 (months 1 through 3). A week after a sperm fertilizes an egg, the egg will implant on the wall of the uterus. This embryo will begin to develop into a baby. Genes from you and your partner will form the baby. The female genes will determine whether the baby will be a boy or a girl. At 6-8 weeks, the eyes and face will be formed, and the heartbeat can be seen on ultrasound. At the end of 12 weeks, all the baby's organs will be formed. Now that you are pregnant, you will want to do everything you can to have a healthy baby. Two of the most important things are to get good prenatal care and to follow your health care provider's instructions. Prenatal care is all the medical care you receive before the baby's birth. This care will help prevent, find, and treat any problems during the pregnancy and childbirth. Body changes during your first trimester Your body goes through many changes during pregnancy. The changes vary from woman to woman.  You may gain or lose a couple of pounds at first.  You may feel sick to your stomach (nauseous) and you may throw up (vomit). If the vomiting is uncontrollable, call your health care provider.  You may tire easily.  You may develop headaches that can be relieved by medicines. All medicines should be approved by your health care provider.  You may urinate more often. Painful urination may mean you have a bladder infection.  You may develop heartburn as a result of your pregnancy.  You may develop constipation because certain hormones are causing the muscles that push stool through your intestines to slow down.  You may develop hemorrhoids or swollen veins (varicose veins).  Your breasts may begin to grow larger and become tender. Your nipples may stick out more, and the tissue that surrounds them (areola) may become darker.  Your gums may bleed and may be  sensitive to brushing and flossing.  Dark spots or blotches (chloasma, mask of pregnancy) may develop on your face. This will likely fade after the baby is born.  Your menstrual periods will stop.  You may have a loss of appetite.  You may develop cravings for certain kinds of food.  You may have changes in your emotions from day to day, such as being excited to be pregnant or being concerned that something may go wrong with the pregnancy and baby.  You may have more vivid and strange dreams.  You may have changes in your hair. These can include thickening of your hair, rapid growth, and changes in texture. Some women also have hair loss during or after pregnancy, or hair that feels dry or thin. Your hair will most likely return to normal after your baby is born.  What to expect at prenatal visits During a routine prenatal visit:  You will be weighed to make sure you and the baby are growing normally.  Your blood pressure will be taken.  Your abdomen will be measured to track your baby's growth.  The fetal heartbeat will be listened to between weeks 10 and 14 of your pregnancy.  Test results from any previous visits will be discussed.  Your health care provider may ask you:  How you are feeling.  If you are feeling the baby move.  If you have had any abnormal symptoms, such as leaking fluid, bleeding, severe headaches,   or abdominal cramping.  If you are using any tobacco products, including cigarettes, chewing tobacco, and electronic cigarettes.  If you have any questions.  Other tests that may be performed during your first trimester include:  Blood tests to find your blood type and to check for the presence of any previous infections. The tests will also be used to check for low iron levels (anemia) and protein on red blood cells (Rh antibodies). Depending on your risk factors, or if you previously had diabetes during pregnancy, you may have tests to check for high blood  sugar that affects pregnant women (gestational diabetes).  Urine tests to check for infections, diabetes, or protein in the urine.  An ultrasound to confirm the proper growth and development of the baby.  Fetal screens for spinal cord problems (spina bifida) and Down syndrome.  HIV (human immunodeficiency virus) testing. Routine prenatal testing includes screening for HIV, unless you choose not to have this test.  You may need other tests to make sure you and the baby are doing well.  Follow these instructions at home: Medicines  Follow your health care provider's instructions regarding medicine use. Specific medicines may be either safe or unsafe to take during pregnancy.  Take a prenatal vitamin that contains at least 600 micrograms (mcg) of folic acid.  If you develop constipation, try taking a stool softener if your health care provider approves. Eating and drinking  Eat a balanced diet that includes fresh fruits and vegetables, whole grains, good sources of protein such as meat, eggs, or tofu, and low-fat dairy. Your health care provider will help you determine the amount of weight gain that is right for you.  Avoid raw meat and uncooked cheese. These carry germs that can cause birth defects in the baby.  Eating four or five small meals rather than three large meals a day may help relieve nausea and vomiting. If you start to feel nauseous, eating a few soda crackers can be helpful. Drinking liquids between meals, instead of during meals, also seems to help ease nausea and vomiting.  Limit foods that are high in fat and processed sugars, such as fried and sweet foods.  To prevent constipation: ? Eat foods that are high in fiber, such as fresh fruits and vegetables, whole grains, and beans. ? Drink enough fluid to keep your urine clear or pale yellow. Activity  Exercise only as directed by your health care provider. Most women can continue their usual exercise routine during  pregnancy. Try to exercise for 30 minutes at least 5 days a week. Exercising will help you: ? Control your weight. ? Stay in shape. ? Be prepared for labor and delivery.  Experiencing pain or cramping in the lower abdomen or lower back is a good sign that you should stop exercising. Check with your health care provider before continuing with normal exercises.  Try to avoid standing for long periods of time. Move your legs often if you must stand in one place for a long time.  Avoid heavy lifting.  Wear low-heeled shoes and practice good posture.  You may continue to have sex unless your health care provider tells you not to. Relieving pain and discomfort  Wear a good support bra to relieve breast tenderness.  Take warm sitz baths to soothe any pain or discomfort caused by hemorrhoids. Use hemorrhoid cream if your health care provider approves.  Rest with your legs elevated if you have leg cramps or low back pain.  If you develop   varicose veins in your legs, wear support hose. Elevate your feet for 15 minutes, 3-4 times a day. Limit salt in your diet. Prenatal care  Schedule your prenatal visits by the twelfth week of pregnancy. They are usually scheduled monthly at first, then more often in the last 2 months before delivery.  Write down your questions. Take them to your prenatal visits.  Keep all your prenatal visits as told by your health care provider. This is important. Safety  Wear your seat belt at all times when driving.  Make a list of emergency phone numbers, including numbers for family, friends, the hospital, and police and fire departments. General instructions  Ask your health care provider for a referral to a local prenatal education class. Begin classes no later than the beginning of month 6 of your pregnancy.  Ask for help if you have counseling or nutritional needs during pregnancy. Your health care provider can offer advice or refer you to specialists for help  with various needs.  Do not use hot tubs, steam rooms, or saunas.  Do not douche or use tampons or scented sanitary pads.  Do not cross your legs for long periods of time.  Avoid cat litter boxes and soil used by cats. These carry germs that can cause birth defects in the baby and possibly loss of the fetus by miscarriage or stillbirth.  Avoid all smoking, herbs, alcohol, and medicines not prescribed by your health care provider. Chemicals in these products affect the formation and growth of the baby.  Do not use any products that contain nicotine or tobacco, such as cigarettes and e-cigarettes. If you need help quitting, ask your health care provider. You may receive counseling support and other resources to help you quit.  Schedule a dentist appointment. At home, brush your teeth with a soft toothbrush and be gentle when you floss. Contact a health care provider if:  You have dizziness.  You have mild pelvic cramps, pelvic pressure, or nagging pain in the abdominal area.  You have persistent nausea, vomiting, or diarrhea.  You have a bad smelling vaginal discharge.  You have pain when you urinate.  You notice increased swelling in your face, hands, legs, or ankles.  You are exposed to fifth disease or chickenpox.  You are exposed to German measles (rubella) and have never had it. Get help right away if:  You have a fever.  You are leaking fluid from your vagina.  You have spotting or bleeding from your vagina.  You have severe abdominal cramping or pain.  You have rapid weight gain or loss.  You vomit blood or material that looks like coffee grounds.  You develop a severe headache.  You have shortness of breath.  You have any kind of trauma, such as from a fall or a car accident. Summary  The first trimester of pregnancy is from week 1 until the end of week 13 (months 1 through 3).  Your body goes through many changes during pregnancy. The changes vary from  woman to woman.  You will have routine prenatal visits. During those visits, your health care provider will examine you, discuss any test results you may have, and talk with you about how you are feeling. This information is not intended to replace advice given to you by your health care provider. Make sure you discuss any questions you have with your health care provider. Document Released: 09/30/2001 Document Revised: 09/17/2016 Document Reviewed: 09/17/2016 Elsevier Interactive Patient Education  2018 [employer]   Inc[employer]  

## 2018-03-04 ENCOUNTER — Telehealth: Payer: Self-pay | Admitting: *Deleted

## 2018-03-04 NOTE — Telephone Encounter (Signed)
Try taking tylenol with caffeine beverage, the vomiting has stopped still with some nausea with bonjesta

## 2018-03-09 ENCOUNTER — Ambulatory Visit (INDEPENDENT_AMBULATORY_CARE_PROVIDER_SITE_OTHER): Payer: Medicaid Other

## 2018-03-09 ENCOUNTER — Other Ambulatory Visit (INDEPENDENT_AMBULATORY_CARE_PROVIDER_SITE_OTHER): Payer: Medicaid Other

## 2018-03-09 ENCOUNTER — Other Ambulatory Visit: Payer: Self-pay | Admitting: Obstetrics & Gynecology

## 2018-03-09 ENCOUNTER — Telehealth: Payer: Self-pay | Admitting: Obstetrics & Gynecology

## 2018-03-09 DIAGNOSIS — R109 Unspecified abdominal pain: Secondary | ICD-10-CM | POA: Diagnosis not present

## 2018-03-09 DIAGNOSIS — O3680X0 Pregnancy with inconclusive fetal viability, not applicable or unspecified: Secondary | ICD-10-CM | POA: Diagnosis not present

## 2018-03-09 DIAGNOSIS — Z3A10 10 weeks gestation of pregnancy: Secondary | ICD-10-CM | POA: Diagnosis not present

## 2018-03-09 LAB — POCT URINALYSIS DIPSTICK
Glucose, UA: NEGATIVE
Leukocytes, UA: NEGATIVE
NITRITE UA: NEGATIVE
Protein, UA: NEGATIVE
RBC UA: NEGATIVE

## 2018-03-09 MED ORDER — BUTALBITAL-APAP-CAFFEINE 50-325-40 MG PO TABS: 1.0000 | ORAL_TABLET | Freq: Four times a day (QID) | ORAL | 0 refills | Status: DC | PRN

## 2018-03-09 NOTE — Telephone Encounter (Signed)
Patient came into the office for her dating [place], patient states that for her second child she suffered from really bad headaches. Pt states that Dr. Elonda Husky has prescribed her something for them and would like a refill of that medication because she is having a lot of bad headaches. Please contact pt

## 2018-03-09 NOTE — Progress Notes (Signed)
[place] 10+4 wks,single IUP,positive fht 164 bpm,normal ovaries bilat,crl 37.61 mm,EDD 10/01/2018 BY [place]

## 2018-03-09 NOTE — Progress Notes (Signed)
Pt here for ultrasound and complained of left flank pain. Spoke with JAG who advised to check urine and send to lab for urinalysis and culture. Urine dip showed 3+ ketones here in office. Advised pt to push fluids. Pt voiced understanding. [place]

## 2018-03-10 LAB — URINALYSIS, ROUTINE W REFLEX MICROSCOPIC
Bilirubin, UA: NEGATIVE
Glucose, UA: NEGATIVE
LEUKOCYTES UA: NEGATIVE
Nitrite, UA: NEGATIVE
PH UA: 5.5 (ref 5.0–7.5)
PROTEIN UA: NEGATIVE
RBC, UA: NEGATIVE
Specific Gravity, UA: 1.024 (ref 1.005–1.030)
Urobilinogen, Ur: 0.2 mg/dL (ref 0.2–1.0)

## 2018-03-11 LAB — URINE CULTURE: ORGANISM ID, BACTERIA: NO GROWTH

## 2018-03-11 NOTE — Telephone Encounter (Signed)
LMOVM that urine showed no growth.

## 2018-03-16 ENCOUNTER — Telehealth (INDEPENDENT_AMBULATORY_CARE_PROVIDER_SITE_OTHER): Payer: Self-pay | Admitting: Internal Medicine

## 2018-03-16 NOTE — Telephone Encounter (Signed)
Patient called stated she would like to talk to someone about a medication she is taking - ph# [phone]

## 2018-03-16 NOTE — Telephone Encounter (Signed)
Forwarded to Terri to address. 

## 2018-03-17 NOTE — Telephone Encounter (Signed)
Take a stool softener OTC. Ask OB GYN about stool softener.

## 2018-03-23 ENCOUNTER — Other Ambulatory Visit: Payer: Self-pay | Admitting: Obstetrics and Gynecology

## 2018-03-23 DIAGNOSIS — Z3682 Encounter for antenatal screening for nuchal translucency: Secondary | ICD-10-CM

## 2018-03-24 ENCOUNTER — Ambulatory Visit (INDEPENDENT_AMBULATORY_CARE_PROVIDER_SITE_OTHER): Payer: Medicaid Other | Admitting: Advanced Practice Midwife

## 2018-03-24 ENCOUNTER — Encounter: Payer: Self-pay | Admitting: Advanced Practice Midwife

## 2018-03-24 ENCOUNTER — Other Ambulatory Visit: Payer: Medicaid Other

## 2018-03-24 ENCOUNTER — Ambulatory Visit: Payer: Medicaid Other | Admitting: *Deleted

## 2018-03-24 ENCOUNTER — Other Ambulatory Visit: Payer: Self-pay

## 2018-03-24 VITALS — BP 91/58 | HR 88 | Wt 106.0 lb

## 2018-03-24 DIAGNOSIS — O09291 Supervision of pregnancy with other poor reproductive or obstetric history, first trimester: Secondary | ICD-10-CM | POA: Diagnosis not present

## 2018-03-24 DIAGNOSIS — Z3490 Encounter for supervision of normal pregnancy, unspecified, unspecified trimester: Secondary | ICD-10-CM | POA: Insufficient documentation

## 2018-03-24 DIAGNOSIS — Z331 Pregnant state, incidental: Secondary | ICD-10-CM

## 2018-03-24 DIAGNOSIS — Z3481 Encounter for supervision of other normal pregnancy, first trimester: Secondary | ICD-10-CM

## 2018-03-24 DIAGNOSIS — Z3A12 12 weeks gestation of pregnancy: Secondary | ICD-10-CM

## 2018-03-24 DIAGNOSIS — Z363 Encounter for antenatal screening for malformations: Secondary | ICD-10-CM

## 2018-03-24 DIAGNOSIS — Z1389 Encounter for screening for other disorder: Secondary | ICD-10-CM

## 2018-03-24 LAB — POCT URINALYSIS DIPSTICK
Blood, UA: NEGATIVE
Glucose, UA: NEGATIVE
KETONES UA: NEGATIVE
Leukocytes, UA: NEGATIVE
NITRITE UA: NEGATIVE
PROTEIN UA: NEGATIVE

## 2018-03-24 MED ORDER — BUTALBITAL-APAP-CAFFEINE 50-325-40 MG PO TABS: 1.0000 | ORAL_TABLET | Freq: Four times a day (QID) | ORAL | 0 refills | Status: DC | PRN
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
## 2018-03-24 NOTE — Patient Instructions (Signed)
 First Trimester of Pregnancy The first trimester of pregnancy is from week 1 until the end of week 12 (months 1 through 3). A week after a sperm fertilizes an egg, the egg will implant on the wall of the uterus. This embryo will begin to develop into a baby. Genes from you and your partner are forming the baby. The female genes determine whether the baby is a boy or a girl. At 6-8 weeks, the eyes and face are formed, and the heartbeat can be seen on ultrasound. At the end of 12 weeks, all the baby's organs are formed.  Now that you are pregnant, you will want to do everything you can to have a healthy baby. Two of the most important things are to get good prenatal care and to follow your health care provider's instructions. Prenatal care is all the medical care you receive before the baby's birth. This care will help prevent, find, and treat any problems during the pregnancy and childbirth. BODY CHANGES Your body goes through many changes during pregnancy. The changes vary from woman to woman.   You may gain or lose a couple of pounds at first.  You may feel sick to your stomach (nauseous) and throw up (vomit). If the vomiting is uncontrollable, call your health care provider.  You may tire easily.  You may develop headaches that can be relieved by medicines approved by your health care provider.  You may urinate more often. Painful urination may mean you have a bladder infection.  You may develop heartburn as a result of your pregnancy.  You may develop constipation because certain hormones are causing the muscles that push waste through your intestines to slow down.  You may develop hemorrhoids or swollen, bulging veins (varicose veins).  Your breasts may begin to grow larger and become tender. Your nipples may stick out more, and the tissue that surrounds them (areola) may become darker.  Your gums may bleed and may be sensitive to brushing and flossing.  Dark spots or blotches  (chloasma, mask of pregnancy) may develop on your face. This will likely fade after the baby is born.  Your menstrual periods will stop.  You may have a loss of appetite.  You may develop cravings for certain kinds of food.  You may have changes in your emotions from day to day, such as being excited to be pregnant or being concerned that something may go wrong with the pregnancy and baby.  You may have more vivid and strange dreams.  You may have changes in your hair. These can include thickening of your hair, rapid growth, and changes in texture. Some women also have hair loss during or after pregnancy, or hair that feels dry or thin. Your hair will most likely return to normal after your baby is born. WHAT TO EXPECT AT YOUR PRENATAL VISITS During a routine prenatal visit:  You will be weighed to make sure you and the baby are growing normally.  Your blood pressure will be taken.  Your abdomen will be measured to track your baby's growth.  The fetal heartbeat will be listened to starting around week 10 or 12 of your pregnancy.  Test results from any previous visits will be discussed. Your health care provider may ask you:  How you are feeling.  If you are feeling the baby move.  If you have had any abnormal symptoms, such as leaking fluid, bleeding, severe headaches, or abdominal cramping.  If you have any questions. Other   tests that may be performed during your first trimester include:  Blood tests to find your blood type and to check for the presence of any previous infections. They will also be used to check for low iron levels (anemia) and Rh antibodies. Later in the pregnancy, blood tests for diabetes will be done along with other tests if problems develop.  Urine tests to check for infections, diabetes, or protein in the urine.  An ultrasound to confirm the proper growth and development of the baby.  An amniocentesis to check for possible genetic problems.  Fetal  screens for spina bifida and Down syndrome.  You may need other tests to make sure you and the baby are doing well. HOME CARE INSTRUCTIONS  Medicines  Follow your health care provider's instructions regarding medicine use. Specific medicines may be either safe or unsafe to take during pregnancy.  Take your prenatal vitamins as directed.  If you develop constipation, try taking a stool softener if your health care provider approves. Diet  Eat regular, well-balanced meals. Choose a variety of foods, such as meat or vegetable-based protein, fish, milk and low-fat dairy products, vegetables, fruits, and whole grain breads and cereals. Your health care provider will help you determine the amount of weight gain that is right for you.  Avoid raw meat and uncooked cheese. These carry germs that can cause birth defects in the baby.  Eating four or five small meals rather than three large meals a day may help relieve nausea and vomiting. If you start to feel nauseous, eating a few soda crackers can be helpful. Drinking liquids between meals instead of during meals also seems to help nausea and vomiting.  If you develop constipation, eat more high-fiber foods, such as fresh vegetables or fruit and whole grains. Drink enough fluids to keep your urine clear or pale yellow. Activity and Exercise  Exercise only as directed by your health care provider. Exercising will help you:  Control your weight.  Stay in shape.  Be prepared for labor and delivery.  Experiencing pain or cramping in the lower abdomen or low back is a good sign that you should stop exercising. Check with your health care provider before continuing normal exercises.  Try to avoid standing for long periods of time. Move your legs often if you must stand in one place for a long time.  Avoid heavy lifting.  Wear low-heeled shoes, and practice good posture.  You may continue to have sex unless your health care provider directs you  otherwise. Relief of Pain or Discomfort  Wear a good support bra for breast tenderness.   Take warm sitz baths to soothe any pain or discomfort caused by hemorrhoids. Use hemorrhoid cream if your health care provider approves.   Rest with your legs elevated if you have leg cramps or low back pain.  If you develop varicose veins in your legs, wear support hose. Elevate your feet for 15 minutes, 3-4 times a day. Limit salt in your diet. Prenatal Care  Schedule your prenatal visits by the twelfth week of pregnancy. They are usually scheduled monthly at first, then more often in the last 2 months before delivery.  Write down your questions. Take them to your prenatal visits.  Keep all your prenatal visits as directed by your health care provider. Safety  Wear your seat belt at all times when driving.  Make a list of emergency phone numbers, including numbers for family, friends, the hospital, and police and fire departments. General   Tips  Ask your health care provider for a referral to a local prenatal education class. Begin classes no later than at the beginning of month 6 of your pregnancy.  Ask for help if you have counseling or nutritional needs during pregnancy. Your health care provider can offer advice or refer you to specialists for help with various needs.  Do not use hot tubs, steam rooms, or saunas.  Do not douche or use tampons or scented sanitary pads.  Do not cross your legs for long periods of time.  Avoid cat litter boxes and soil used by cats. These carry germs that can cause birth defects in the baby and possibly loss of the fetus by miscarriage or stillbirth.  Avoid all smoking, herbs, alcohol, and medicines not prescribed by your health care provider. Chemicals in these affect the formation and growth of the baby.  Schedule a dentist appointment. At home, brush your teeth with a soft toothbrush and be gentle when you floss. SEEK MEDICAL CARE IF:   You have  dizziness.  You have mild pelvic cramps, pelvic pressure, or nagging pain in the abdominal area.  You have persistent nausea, vomiting, or diarrhea.  You have a bad smelling vaginal discharge.  You have pain with urination.  You notice increased swelling in your face, hands, legs, or ankles. SEEK IMMEDIATE MEDICAL CARE IF:   You have a fever.  You are leaking fluid from your vagina.  You have spotting or bleeding from your vagina.  You have severe abdominal cramping or pain.  You have rapid weight gain or loss.  You vomit blood or material that looks like coffee grounds.  You are exposed to German measles and have never had them.  You are exposed to fifth disease or chickenpox.  You develop a severe headache.  You have shortness of breath.  You have any kind of trauma, such as from a fall or a car accident. Document Released: 09/30/2001 Document Revised: 02/20/2014 Document Reviewed: 08/16/2013 Exit[employer] Patient Information 2015 [employer]. This information is not intended to replace advice given to you by your health care provider. Make sure you discuss any questions you have with your health care provider.   Nausea & Vomiting  Have saltine crackers or pretzels by your bed and eat a few bites before you raise your head out of bed in the morning  Eat small frequent meals throughout the day instead of large meals  Drink plenty of fluids throughout the day to stay hydrated, just don't drink a lot of fluids with your meals.  This can make your stomach fill up faster making you feel sick  Do not brush your teeth right after you eat  Products with real ginger are good for nausea, like ginger ale and ginger hard candy Make sure it says made with real ginger!  Sucking on sour candy like lemon heads is also good for nausea  If your prenatal vitamins make you nauseated, take them at night so you will sleep through the nausea  Sea Bands  If you feel like you need  medicine for the nausea & vomiting please let us know  If you are unable to keep any fluids or food down please let us know   Constipation  Drink plenty of fluid, preferably water, throughout the day  Eat foods high in fiber such as fruits, vegetables, and grains  Exercise, such as walking, is a good way to keep your bowels regular  Drink warm fluids, especially warm   prune juice, or decaf coffee  Eat a 1/2 cup of real oatmeal (not instant), 1/2 cup applesauce, and 1/2-1 cup warm prune juice every day  If needed, you may take Colace (docusate sodium) stool softener once or twice a day to help keep the stool soft. If you are pregnant, wait until you are out of your first trimester (12-14 weeks of pregnancy)  If you still are having problems with constipation, you may take Miralax once daily as needed to help keep your bowels regular.  If you are pregnant, wait until you are out of your first trimester (12-14 weeks of pregnancy)  Safe Medications in Pregnancy   Acne: Benzoyl Peroxide Salicylic Acid  Backache/Headache: Tylenol: 2 regular strength every 4 hours OR              2 Extra strength every 6 hours  Colds/Coughs/Allergies: Benadryl (alcohol free) 25 mg every 6 hours as needed Breath right strips Claritin Cepacol throat lozenges Chloraseptic throat spray Cold-Eeze- up to three times per day Cough drops, alcohol free Flonase (by prescription only) Guaifenesin Mucinex Robitussin DM (plain only, alcohol free) Saline nasal spray/drops Sudafed (pseudoephedrine) & Actifed ** use only after [redacted] weeks gestation and if you do not have high blood pressure Tylenol Vicks Vaporub Zinc lozenges Zyrtec   Constipation: Colace Ducolax suppositories Fleet enema Glycerin suppositories Metamucil Milk of magnesia Miralax Senokot Smooth move tea  Diarrhea: Kaopectate Imodium A-D  *NO pepto Bismol  Hemorrhoids: Anusol Anusol HC Preparation  H Tucks  Indigestion: Tums Maalox Mylanta Zantac  Pepcid  Insomnia: Benadryl (alcohol free) 25mg every 6 hours as needed Tylenol PM Unisom, no Gelcaps  Leg Cramps: Tums MagGel  Nausea/Vomiting:  Bonine Dramamine Emetrol Ginger extract Sea bands Meclizine  Nausea medication to take during pregnancy:  Unisom (doxylamine succinate 25 mg tablets) Take one tablet daily at bedtime. If symptoms are not adequately controlled, the dose can be increased to a maximum recommended dose of two tablets daily (1/2 tablet in the morning, 1/2 tablet mid-afternoon and one at bedtime). Vitamin B6 100mg tablets. Take one tablet twice a day (up to 200 mg per day).  Skin Rashes: Aveeno products Benadryl cream or 25mg every 6 hours as needed Calamine Lotion 1% cortisone cream  Yeast infection: Gyne-lotrimin 7 Monistat 7   **If taking multiple medications, please check labels to avoid duplicating the same active ingredients **take medication as directed on the label ** Do not exceed 4000 mg of tylenol in 24 hours **Do not take medications that contain aspirin or ibuprofen      

## 2018-03-25 LAB — PMP SCREEN PROFILE (10S), URINE
AMPHETAMINE SCREEN URINE: NEGATIVE ng/mL
BARBITURATE SCREEN URINE: NEGATIVE ng/mL
BENZODIAZEPINE SCREEN, URINE: NEGATIVE ng/mL
CANNABINOIDS UR QL SCN: POSITIVE ng/mL — AB
CREATININE(CRT), U: 42.9 mg/dL (ref 20.0–300.0)
Cocaine (Metab) Scrn, Ur: NEGATIVE ng/mL
METHADONE SCREEN, URINE: NEGATIVE ng/mL
OPIATE SCREEN URINE: NEGATIVE ng/mL
OXYCODONE+OXYMORPHONE UR QL SCN: NEGATIVE ng/mL
PHENCYCLIDINE QUANTITATIVE URINE: NEGATIVE ng/mL
PROPOXYPHENE SCREEN URINE: NEGATIVE ng/mL
Ph of Urine: 7.3 (ref 4.5–8.9)

## 2018-03-25 LAB — URINALYSIS, ROUTINE W REFLEX MICROSCOPIC
Bilirubin, UA: NEGATIVE
GLUCOSE, UA: NEGATIVE
Ketones, UA: NEGATIVE
LEUKOCYTES UA: NEGATIVE
Nitrite, UA: NEGATIVE
PROTEIN UA: NEGATIVE
RBC, UA: NEGATIVE
SPEC GRAV UA: 1.012 (ref 1.005–1.030)
Urobilinogen, Ur: 0.2 mg/dL (ref 0.2–1.0)
pH, UA: 7 (ref 5.0–7.5)

## 2018-03-25 LAB — OBSTETRIC PANEL, INCLUDING HIV
Antibody Screen: NEGATIVE
BASOS ABS: 0 10*3/uL (ref 0.0–0.2)
Basos: 0 %
EOS (ABSOLUTE): 0.1 10*3/uL (ref 0.0–0.4)
Eos: 1 %
HIV Screen 4th Generation wRfx: NONREACTIVE
Hematocrit: 35.6 % (ref 34.0–46.6)
Hemoglobin: 11.5 g/dL (ref 11.1–15.9)
Hepatitis B Surface Ag: NEGATIVE
IMMATURE GRANULOCYTES: 0 %
Immature Grans (Abs): 0 10*3/uL (ref 0.0–0.1)
Lymphocytes Absolute: 2 10*3/uL (ref 0.7–3.1)
Lymphs: 26 %
MCH: 28.3 pg (ref 26.6–33.0)
MCHC: 32.3 g/dL (ref 31.5–35.7)
MCV: 88 fL (ref 79–97)
MONOCYTES: 5 %
Monocytes Absolute: 0.4 10*3/uL (ref 0.1–0.9)
NEUTROS ABS: 5.2 10*3/uL (ref 1.4–7.0)
NEUTROS PCT: 68 %
PLATELETS: 290 10*3/uL (ref 150–450)
RBC: 4.07 x10E6/uL (ref 3.77–5.28)
RDW: 15 % (ref 12.3–15.4)
RPR: NONREACTIVE
Rh Factor: POSITIVE
Rubella Antibodies, IGG: 2.52 index (ref >0.99)
WBC: 7.7 10*3/uL (ref 3.4–10.8)

## 2018-03-25 LAB — MED LIST OPTION NOT SELECTED

## 2018-03-26 ENCOUNTER — Telehealth: Payer: Self-pay | Admitting: *Deleted

## 2018-03-26 ENCOUNTER — Telehealth: Payer: Self-pay | Admitting: Adult Health

## 2018-03-26 LAB — URINE CULTURE: Organism ID, Bacteria: NO GROWTH

## 2018-03-26 MED ORDER — DOXYLAMINE-PYRIDOXINE 10-10 MG PO TBEC: DELAYED_RELEASE_TABLET | ORAL | 6 refills | Status: DC

## 2018-03-26 NOTE — Telephone Encounter (Signed)
LMOVM that Rx for diclegis had been sent to her pharmacy.

## 2018-03-26 NOTE — Telephone Encounter (Signed)
Anderson [place] gave her samples Bonjesta , out of samples can she get that or zopran call in/ [facility] [place] / please advise pt

## 2018-03-26 NOTE — Telephone Encounter (Signed)
Pharmacy notified PA approved for Diclegis.

## 2018-03-29 ENCOUNTER — Telehealth: Payer: Self-pay | Admitting: Advanced Practice Midwife

## 2018-03-29 NOTE — Telephone Encounter (Signed)
WAS HERE TO SEE Judy Lang LAST WEEK AND GAVE HER A PAIN PATCH AND TOLD HER IF IT WORKED SHE WOULD CALL HER IN SOME/ TO [employer] IN [place]

## 2018-03-31 NOTE — Telephone Encounter (Signed)
Called patient back and left message that she can get the lidocaine patch otc.

## 2018-03-31 NOTE — Telephone Encounter (Signed)
IT's not covered by insurance  I told her to get the OTC 4% Lidocaine patch.

## 2018-04-14 ENCOUNTER — Encounter: Payer: Self-pay | Admitting: Women's Health

## 2018-04-14 ENCOUNTER — Ambulatory Visit (INDEPENDENT_AMBULATORY_CARE_PROVIDER_SITE_OTHER): Payer: Medicaid Other | Admitting: Women's Health

## 2018-04-14 VITALS — BP 85/47 | HR 88 | Wt 105.0 lb

## 2018-04-14 DIAGNOSIS — Z1389 Encounter for screening for other disorder: Secondary | ICD-10-CM

## 2018-04-14 DIAGNOSIS — Z363 Encounter for antenatal screening for malformations: Secondary | ICD-10-CM

## 2018-04-14 DIAGNOSIS — Z113 Encounter for screening for infections with a predominantly sexual mode of transmission: Secondary | ICD-10-CM

## 2018-04-14 DIAGNOSIS — Z3A15 15 weeks gestation of pregnancy: Secondary | ICD-10-CM

## 2018-04-14 DIAGNOSIS — Z3482 Encounter for supervision of other normal pregnancy, second trimester: Secondary | ICD-10-CM

## 2018-04-14 DIAGNOSIS — Z331 Pregnant state, incidental: Secondary | ICD-10-CM

## 2018-04-14 DIAGNOSIS — Z1379 Encounter for other screening for genetic and chromosomal anomalies: Secondary | ICD-10-CM

## 2018-04-14 DIAGNOSIS — O09299 Supervision of pregnancy with other poor reproductive or obstetric history, unspecified trimester: Secondary | ICD-10-CM

## 2018-04-14 LAB — POCT URINALYSIS DIPSTICK
Glucose, UA: NEGATIVE
Ketones, UA: NEGATIVE
LEUKOCYTES UA: NEGATIVE
Nitrite, UA: NEGATIVE
Protein, UA: NEGATIVE
RBC UA: NEGATIVE

## 2018-04-14 NOTE — Progress Notes (Signed)
   LOW-RISK PREGNANCY VISIT [name] MRN [id number]  Date of birth: 11/07/1985 Chief Complaint:   Routine Prenatal Visit  History of Present Illness:   Judy Lang is a 32 y.o. [id number] female at [redacted]w[redacted]d with an Estimated Date of Delivery: 10/01/18 being seen today for ongoing management of a low-risk pregnancy.  Today she reports constipation.  . Vag. Bleeding: None.  Movement: Present. denies leaking of fluid. Review of Systems:   Pertinent items are noted in HPI Denies abnormal vaginal discharge w/ itching/odor/irritation, headaches, visual changes, shortness of breath, chest pain, abdominal pain, severe nausea/vomiting, or problems with urination or bowel movements unless otherwise stated above. Pertinent History Reviewed:  Reviewed past medical,surgical, social, obstetrical and family history.  Reviewed problem list, medications and allergies. Physical Assessment:   Vitals:   04/14/18 1043  BP: (!) 85/47  Pulse: 88  Weight: 105 lb (47.6 kg)  Body mass index is 19.2 kg/m.        Physical Examination:   General appearance: Well appearing, and in no distress  Mental status: Alert, oriented to person, place, and time  Skin: Warm & dry  Cardiovascular: Normal heart rate noted  Respiratory: Normal respiratory effort, no distress  Abdomen: Soft, gravid, nontender  Pelvic: Cervical exam deferred         Extremities: Edema: None  Fetal Status: Fetal Heart Rate (bpm): 157   Movement: Present    Results for orders placed or performed in visit on 04/14/18 (from the past 24 hour(s))  POCT Urinalysis Dipstick   Collection Time: 04/14/18 10:50 AM  Result Value Ref Range   Color, UA     Clarity, UA     Glucose, UA Negative Negative   Bilirubin, UA     Ketones, UA neg    Spec Grav, UA  1.010 - 1.025   Blood, UA neg    pH, UA  5.0 - 8.0   Protein, UA Negative Negative   Urobilinogen, UA  0.2 or 1.0 E.U./dL   Nitrite, UA neg    Leukocytes, UA Negative  Negative   Appearance     Odor      Assessment & Plan:  1) Low-risk pregnancy [id number] at [redacted]w[redacted]d with an Estimated Date of Delivery: 10/01/18   2) Constipation, gave printed prevention/relief measures    Meds: No orders of the defined types were placed in this encounter.  Labs/procedures today: AFP, GC/CT-not done at new ob  Plan:  Continue routine obstetrical care   Reviewed: Preterm labor symptoms and general obstetric precautions including but not limited to vaginal bleeding, contractions, leaking of fluid and fetal movement were reviewed in detail with the patient.  All questions were answered  Follow-up: Return in about 3 weeks (around 05/05/2018) for LROB, [id number].  Orders Placed This Encounter  Procedures  . GC/Chlamydia Probe Amp  . US OB Comp + 14 Wk  . AFP TETRA  . POCT Urinalysis Dipstick   SETAREH ROM CNM, [facility] 04/14/2018 11:27 AM

## 2018-04-14 NOTE — Patient Instructions (Addendum)
Judy Lang, I greatly value your feedback.  If you receive a survey following your visit with [place] today, we appreciate you taking the time to fill it out.  Thanks, [name], CNM, WHNP-BC  Constipation  Drink plenty of fluid, preferably water, throughout the day  Eat foods high in fiber such as fruits, vegetables, and grains  Exercise, such as walking, is a good way to keep your bowels regular  Drink warm fluids, especially warm prune juice, or decaf coffee  Eat a 1/2 cup of real oatmeal (not instant), 1/2 cup applesauce, and 1/2-1 cup warm prune juice every day  If needed, you may take Colace (docusate sodium) stool softener once or twice a day to help keep the stool soft. If you are pregnant, wait until you are out of your first trimester (12-14 weeks of pregnancy)  If you still are having problems with constipation, you may take Miralax once daily as needed to help keep your bowels regular.  If you are pregnant, wait until you are out of your first trimester (12-14 weeks of pregnancy)      Second Trimester of Pregnancy The second trimester is from week 14 through week 27 (months 4 through 6). The second trimester is often a time when you feel your best. Your body has adjusted to being pregnant, and you begin to feel better physically. Usually, morning sickness has lessened or quit completely, you may have more energy, and you may have an increase in appetite. The second trimester is also a time when the fetus is growing rapidly. At the end of the sixth month, the fetus is about 9 inches long and weighs about 1 pounds. You will likely begin to feel the baby move (quickening) between 16 and 20 weeks of pregnancy. Body changes during your second trimester Your body continues to go through many changes during your second trimester. The changes vary from woman to woman.  Your weight will continue to increase. You will notice your lower abdomen bulging out.  You may begin to get  stretch marks on your hips, abdomen, and breasts.  You may develop headaches that can be relieved by medicines. The medicines should be approved by your health care provider.  You may urinate more often because the fetus is pressing on your bladder.  You may develop or continue to have heartburn as a result of your pregnancy.  You may develop constipation because certain hormones are causing the muscles that push waste through your intestines to slow down.  You may develop hemorrhoids or swollen, bulging veins (varicose veins).  You may have back pain. This is caused by: ? Weight gain. ? Pregnancy hormones that are relaxing the joints in your pelvis. ? A shift in weight and the muscles that support your balance.  Your breasts will continue to grow and they will continue to become tender.  Your gums may bleed and may be sensitive to brushing and flossing.  Dark spots or blotches (chloasma, mask of pregnancy) may develop on your face. This will likely fade after the baby is born.  A dark line from your belly button to the pubic area (linea nigra) may appear. This will likely fade after the baby is born.  You may have changes in your hair. These can include thickening of your hair, rapid growth, and changes in texture. Some women also have hair loss during or after pregnancy, or hair that feels dry or thin. Your hair will most likely return to normal after  your baby is born.  What to expect at prenatal visits During a routine prenatal visit:  You will be weighed to make sure you and the fetus are growing normally.  Your blood pressure will be taken.  Your abdomen will be measured to track your baby's growth.  The fetal heartbeat will be listened to.  Any test results from the previous visit will be discussed.  Your health care provider may ask you:  How you are feeling.  If you are feeling the baby move.  If you have had any abnormal symptoms, such as leaking fluid,  bleeding, severe headaches, or abdominal cramping.  If you are using any tobacco products, including cigarettes, chewing tobacco, and electronic cigarettes.  If you have any questions.  Other tests that may be performed during your second trimester include:  Blood tests that check for: ? Low iron levels (anemia). ? High blood sugar that affects pregnant women (gestational diabetes) between 37 and 28 weeks. ? Rh antibodies. This is to check for a protein on red blood cells (Rh factor).  Urine tests to check for infections, diabetes, or protein in the urine.  An ultrasound to confirm the proper growth and development of the baby.  An amniocentesis to check for possible genetic problems.  Fetal screens for spina bifida and Down syndrome.  HIV (human immunodeficiency virus) testing. Routine prenatal testing includes screening for HIV, unless you choose not to have this test.  Follow these instructions at home: Medicines  Follow your health care provider's instructions regarding medicine use. Specific medicines may be either safe or unsafe to take during pregnancy.  Take a prenatal vitamin that contains at least 600 micrograms (mcg) of folic acid.  If you develop constipation, try taking a stool softener if your health care provider approves. Eating and drinking  Eat a balanced diet that includes fresh fruits and vegetables, whole grains, good sources of protein such as meat, eggs, or tofu, and low-fat dairy. Your health care provider will help you determine the amount of weight gain that is right for you.  Avoid raw meat and uncooked cheese. These carry germs that can cause birth defects in the baby.  If you have low calcium intake from food, talk to your health care provider about whether you should take a daily calcium supplement.  Limit foods that are high in fat and processed sugars, such as fried and sweet foods.  To prevent constipation: ? Drink enough fluid to keep your  urine clear or pale yellow. ? Eat foods that are high in fiber, such as fresh fruits and vegetables, whole grains, and beans. Activity  Exercise only as directed by your health care provider. Most women can continue their usual exercise routine during pregnancy. Try to exercise for 30 minutes at least 5 days a week. Stop exercising if you experience uterine contractions.  Avoid heavy lifting, wear low heel shoes, and practice good posture.  A sexual relationship may be continued unless your health care provider directs you otherwise. Relieving pain and discomfort  Wear a good support bra to prevent discomfort from breast tenderness.  Take warm sitz baths to soothe any pain or discomfort caused by hemorrhoids. Use hemorrhoid cream if your health care provider approves.  Rest with your legs elevated if you have leg cramps or low back pain.  If you develop varicose veins, wear support hose. Elevate your feet for 15 minutes, 3-4 times a day. Limit salt in your diet. Prenatal Care  Write down your  questions. Take them to your prenatal visits.  Keep all your prenatal visits as told by your health care provider. This is important. Safety  Wear your seat belt at all times when driving.  Make a list of emergency phone numbers, including numbers for family, friends, the hospital, and police and fire departments. General instructions  Ask your health care provider for a referral to a local prenatal education class. Begin classes no later than the beginning of month 6 of your pregnancy.  Ask for help if you have counseling or nutritional needs during pregnancy. Your health care provider can offer advice or refer you to specialists for help with various needs.  Do not use hot tubs, steam rooms, or saunas.  Do not douche or use tampons or scented sanitary pads.  Do not cross your legs for long periods of time.  Avoid cat litter boxes and soil used by cats. These carry germs that can cause  birth defects in the baby and possibly loss of the fetus by miscarriage or stillbirth.  Avoid all smoking, herbs, alcohol, and unprescribed drugs. Chemicals in these products can affect the formation and growth of the baby.  Do not use any products that contain nicotine or tobacco, such as cigarettes and e-cigarettes. If you need help quitting, ask your health care provider.  Visit your dentist if you have not gone yet during your pregnancy. Use a soft toothbrush to brush your teeth and be gentle when you floss. Contact a health care provider if:  You have dizziness.  You have mild pelvic cramps, pelvic pressure, or nagging pain in the abdominal area.  You have persistent nausea, vomiting, or diarrhea.  You have a bad smelling vaginal discharge.  You have pain when you urinate. Get help right away if:  You have a fever.  You are leaking fluid from your vagina.  You have spotting or bleeding from your vagina.  You have severe abdominal cramping or pain.  You have rapid weight gain or weight loss.  You have shortness of breath with chest pain.  You notice sudden or extreme swelling of your face, hands, ankles, feet, or legs.  You have not felt your baby move in over an hour.  You have severe headaches that do not go away when you take medicine.  You have vision changes. Summary  The second trimester is from week 14 through week 27 (months 4 through 6). It is also a time when the fetus is growing rapidly.  Your body goes through many changes during pregnancy. The changes vary from woman to woman.  Avoid all smoking, herbs, alcohol, and unprescribed drugs. These chemicals affect the formation and growth your baby.  Do not use any tobacco products, such as cigarettes, chewing tobacco, and e-cigarettes. If you need help quitting, ask your health care provider.  Contact your health care provider if you have any questions. Keep all prenatal visits as told by your health care  provider. This is important. This information is not intended to replace advice given to you by your health care provider. Make sure you discuss any questions you have with your health care provider. Document Released: 09/30/2001 Document Revised: 03/13/2016 Document Reviewed: 12/07/2012 Elsevier Interactive Patient Education  2017 [employer].

## 2018-04-15 LAB — GC/CHLAMYDIA PROBE AMP
CHLAMYDIA, DNA PROBE: NEGATIVE
Neisseria gonorrhoeae by PCR: NEGATIVE

## 2018-04-16 LAB — AFP TETRA
DIA Mom Value: 1.26
DIA Value (EIA): 287.51 pg/mL
DSR (By Age)    1 IN: 656
DSR (SECOND TRIMESTER) 1 IN: 8591
GESTATIONAL AGE AFP: 15.5 wk
MSAFP Mom: 1.16
MSAFP: 44.7 ng/mL
MSHCG Mom: 0.5
MSHCG: 32275 m[IU]/mL
Maternal Age At EDD: 30.5 yr
OSB RISK: 7366
T18 (By Age): 1:2556 {titer}
TEST RESULTS AFP: NEGATIVE
UE3 MOM: 0.91
WEIGHT: 105 [lb_av]
uE3 Value: 0.77 ng/mL

## 2018-05-04 ENCOUNTER — Encounter (INDEPENDENT_AMBULATORY_CARE_PROVIDER_SITE_OTHER): Payer: Self-pay | Admitting: Internal Medicine

## 2018-05-04 ENCOUNTER — Ambulatory Visit (INDEPENDENT_AMBULATORY_CARE_PROVIDER_SITE_OTHER): Payer: Medicaid Other | Admitting: Internal Medicine

## 2018-05-04 VITALS — BP 92/60 | HR 84 | Temp 98.0°F | Ht 62.0 in | Wt 105.4 lb

## 2018-05-04 DIAGNOSIS — K5904 Chronic idiopathic constipation: Secondary | ICD-10-CM | POA: Diagnosis not present

## 2018-05-04 DIAGNOSIS — F332 Major depressive disorder, recurrent severe without psychotic features: Secondary | ICD-10-CM | POA: Diagnosis not present

## 2018-05-04 NOTE — Patient Instructions (Signed)
Continue the Miralax. If u need a glycerin supp as needed, please ask pharmacy.
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
## 2018-05-05 ENCOUNTER — Ambulatory Visit (INDEPENDENT_AMBULATORY_CARE_PROVIDER_SITE_OTHER): Payer: Medicaid Other

## 2018-05-05 ENCOUNTER — Other Ambulatory Visit: Payer: Self-pay

## 2018-05-05 ENCOUNTER — Encounter: Payer: Self-pay | Admitting: Obstetrics and Gynecology

## 2018-05-05 ENCOUNTER — Ambulatory Visit (INDEPENDENT_AMBULATORY_CARE_PROVIDER_SITE_OTHER): Payer: Medicaid Other | Admitting: Obstetrics and Gynecology

## 2018-05-05 VITALS — BP 105/72 | HR 86 | Wt 105.0 lb

## 2018-05-05 DIAGNOSIS — Z331 Pregnant state, incidental: Secondary | ICD-10-CM

## 2018-05-05 DIAGNOSIS — Z1389 Encounter for screening for other disorder: Secondary | ICD-10-CM

## 2018-05-05 DIAGNOSIS — Z363 Encounter for antenatal screening for malformations: Secondary | ICD-10-CM | POA: Diagnosis not present

## 2018-05-05 DIAGNOSIS — Z3A18 18 weeks gestation of pregnancy: Secondary | ICD-10-CM

## 2018-05-05 DIAGNOSIS — Z3402 Encounter for supervision of normal first pregnancy, second trimester: Secondary | ICD-10-CM

## 2018-05-05 DIAGNOSIS — Z3482 Encounter for supervision of other normal pregnancy, second trimester: Secondary | ICD-10-CM

## 2018-05-05 LAB — POCT URINALYSIS DIPSTICK
GLUCOSE UA: NEGATIVE
Ketones, UA: NEGATIVE
LEUKOCYTES UA: NEGATIVE
Nitrite, UA: NEGATIVE
Protein, UA: NEGATIVE
RBC UA: NEGATIVE

## 2018-05-05 NOTE — Progress Notes (Signed)
[place] 29+0 wks,cephalic,cx 3.4 cm,posterior placenta grade 0,normal ovaries bilat,svp of fluid 5.4 cm,fhr 148 bpm,bilat choroid plexus cysts right 5 x 4 x 4 mm,left 7 x 4 x 4 mm,EFW 275 g 68 %,anatomy complete

## 2018-05-05 NOTE — Progress Notes (Addendum)
Patient ID: JOLIET MALLOZZI, female   DOB: 12/01/1985, 32 y.o.   MRN: [id number]    LOW-RISK PREGNANCY VISIT [name] MRN [id number]  Date of birth: [birth year] Chief Complaint:   Routine Prenatal Visit (u/s today)  History of Present Illness:   Judy Lang is a 32 y.o. [id number] female at [redacted]w[redacted]d with an Estimated Date of Delivery: 10/01/18 being seen today for ongoing management of a low-risk pregnancy. This is her 3rd child and have delivered vaginally. First child was breech but still delivered vaginally. Has breastfeed her other 2 children.  Today she reports no complaints.  .  .   . denies leaking of fluid. Review of Systems:   Pertinent items are noted in HPI Denies abnormal vaginal discharge w/ itching/odor/irritation, headaches, visual changes, shortness of breath, chest pain, abdominal pain, severe nausea/vomiting, or problems with urination or bowel movements unless otherwise stated above. Pertinent History Reviewed:  Reviewed past medical,surgical, social, obstetrical and family history.  Reviewed problem list, medications and allergies. Physical Assessment:  There were no vitals filed for this visit.There is no height or weight on file to calculate BMI.        Physical Examination:   General appearance: Well appearing, and in no distress  Mental status: Alert, oriented to person, place, and time  Skin: Warm & dry  Cardiovascular: Normal heart rate noted  Respiratory: Normal respiratory effort, no distress  Abdomen: Soft, gravid, nontender fh 16 cm u-3 cm   Pelvic: Cervical exam deferred         Extremities:    Fetal Status:          No results found for this or any previous visit (from the past 24 hour(s)).  Assessment & Plan:  1) Low-risk pregnancy [id number] at [redacted]w[redacted]d with an Estimated Date of Delivery: 10/01/18  NOTE" pt has normal sized babies but carries them low.   Meds: No orders of the defined types were placed in this  encounter.  Labs/procedures today:   Plan:  Continue routine obstetrical care    Follow-up: No follow-ups on file.  Orders Placed This Encounter  Procedures  . POCT urinalysis dipstick   By signing my name below, I, Samul Dada, attest that this documentation has been prepared under the direction and in the presence of Jonnie Kind, MD. Electronically Signed: [place]. 05/05/18. 9:00 AM.  I personally performed the services described in this documentation, which was SCRIBED in my presence. The recorded information has been reviewed and considered accurate. It has been edited as necessary during review. Jonnie Kind, MD

## 2018-05-28 ENCOUNTER — Telehealth: Payer: Self-pay | Admitting: Advanced Practice Midwife

## 2018-05-28 NOTE — Telephone Encounter (Signed)
Pt is 22 weeks thinks she has a urine infection/ she has appt next THurs please call and advise

## 2018-05-28 NOTE — Telephone Encounter (Signed)
Pt called stating that she thinks she has a UTI or some other type of vaginal infection. She states she is having pain with urination. She also c/o thick white discharge and itching. Advised that she could have a yeast infection and she could try OTC monistat 7 to see if that would help relieve those symptoms. Advised that she could come and leave a urine sample for [place] to check for a UTI. No c/o vaginal bleeding, no other fluid leaking and baby is moving. Advised if s/s worsen over the weekend she can seek medical attn at [place] verbalized understanding.

## 2018-06-03 ENCOUNTER — Ambulatory Visit (INDEPENDENT_AMBULATORY_CARE_PROVIDER_SITE_OTHER): Payer: Medicaid Other | Admitting: Advanced Practice Midwife

## 2018-06-03 ENCOUNTER — Encounter: Payer: Self-pay | Admitting: Advanced Practice Midwife

## 2018-06-03 VITALS — BP 84/54 | HR 115 | Wt 106.0 lb

## 2018-06-03 DIAGNOSIS — Z3A22 22 weeks gestation of pregnancy: Secondary | ICD-10-CM

## 2018-06-03 DIAGNOSIS — Z1389 Encounter for screening for other disorder: Secondary | ICD-10-CM

## 2018-06-03 DIAGNOSIS — Z331 Pregnant state, incidental: Secondary | ICD-10-CM

## 2018-06-03 DIAGNOSIS — Z3482 Encounter for supervision of other normal pregnancy, second trimester: Secondary | ICD-10-CM

## 2018-06-03 LAB — POCT URINALYSIS DIPSTICK OB
Blood, UA: NEGATIVE
Glucose, UA: NEGATIVE — AB
KETONES UA: NEGATIVE
LEUKOCYTES UA: NEGATIVE
NITRITE UA: NEGATIVE

## 2018-06-03 NOTE — Progress Notes (Signed)
  [id number] [redacted]w[redacted]d Estimated Date of Delivery: 10/01/18  Blood pressure (!) 84/54, pulse (!) 115, weight 106 lb (48.1 kg).   BP weight and urine results all reviewed and noted.  Please refer to the obstetrical flow sheet for the fundal height and fetal heart rate documentation:  Patient reports good fetal movement, denies any bleeding and no rupture of membranes symptoms or regular contractions. Patient is without complaints. All questions were answered.   Physical Assessment:   Vitals:   06/03/18 0930  BP: (!) 84/54  Pulse: (!) 115  Weight: 106 lb (48.1 kg)  Body mass index is 19.39 kg/m.        Physical Examination:   General appearance: Well appearing, and in no distress  Mental status: Alert, oriented to person, place, and time  Skin: Warm & dry  Cardiovascular: Normal heart rate noted  Respiratory: Normal respiratory effort, no distress  Abdomen: Soft, gravid, nontender  Pelvic: Cervical exam deferred         Extremities: Edema: None  Fetal Status:     Movement: Present    Results for orders placed or performed in visit on 06/03/18 (from the past 24 hour(s))  POC Urinalysis Dipstick OB   Collection Time: 06/03/18  9:32 AM  Result Value Ref Range   Color, UA     Clarity, UA     Glucose, UA Negative (A) (none)   Bilirubin, UA     Ketones, UA neg    Spec Grav, UA     Blood, UA neg    pH, UA     POC Protein UA Trace Negative, Trace   Urobilinogen, UA     Nitrite, UA neg    Leukocytes, UA Negative Negative   Appearance     Odor       Orders Placed This Encounter  Procedures  . POC Urinalysis Dipstick OB    Plan:  Continued routine obstetrical care, try nutritional shakes d/t decreased appetite.   Return for PN2/LROB.

## 2018-06-03 NOTE — Patient Instructions (Signed)

## 2018-06-16 ENCOUNTER — Other Ambulatory Visit: Payer: Self-pay | Admitting: Advanced Practice Midwife

## 2018-06-16 ENCOUNTER — Telehealth: Payer: Self-pay | Admitting: Obstetrics & Gynecology

## 2018-06-16 DIAGNOSIS — F332 Major depressive disorder, recurrent severe without psychotic features: Secondary | ICD-10-CM | POA: Diagnosis not present

## 2018-06-16 MED ORDER — ONDANSETRON 4 MG PO TBDP: 4.0000 mg | ORAL_TABLET | Freq: Four times a day (QID) | ORAL | 2 refills | Status: DC | PRN

## 2018-06-16 NOTE — Progress Notes (Signed)
zofran

## 2018-07-01 ENCOUNTER — Other Ambulatory Visit: Payer: Medicaid Other

## 2018-07-01 ENCOUNTER — Ambulatory Visit (INDEPENDENT_AMBULATORY_CARE_PROVIDER_SITE_OTHER): Payer: Medicaid Other | Admitting: Women's Health

## 2018-07-01 ENCOUNTER — Encounter: Payer: Self-pay | Admitting: Women's Health

## 2018-07-01 VITALS — BP 97/60 | HR 86 | Wt 107.0 lb

## 2018-07-01 DIAGNOSIS — Z3A26 26 weeks gestation of pregnancy: Secondary | ICD-10-CM

## 2018-07-01 DIAGNOSIS — Z3482 Encounter for supervision of other normal pregnancy, second trimester: Secondary | ICD-10-CM

## 2018-07-01 DIAGNOSIS — IMO0001 Reserved for inherently not codable concepts without codable children: Secondary | ICD-10-CM

## 2018-07-01 DIAGNOSIS — Z1389 Encounter for screening for other disorder: Secondary | ICD-10-CM

## 2018-07-01 DIAGNOSIS — Z131 Encounter for screening for diabetes mellitus: Secondary | ICD-10-CM

## 2018-07-01 DIAGNOSIS — O99322 Drug use complicating pregnancy, second trimester: Secondary | ICD-10-CM

## 2018-07-01 DIAGNOSIS — Z331 Pregnant state, incidental: Secondary | ICD-10-CM

## 2018-07-01 DIAGNOSIS — O350XX0 Maternal care for (suspected) central nervous system malformation in fetus, not applicable or unspecified: Secondary | ICD-10-CM

## 2018-07-01 LAB — POCT URINALYSIS DIPSTICK OB
Blood, UA: NEGATIVE
GLUCOSE, UA: NEGATIVE
KETONES UA: NEGATIVE
Leukocytes, UA: NEGATIVE
Nitrite, UA: NEGATIVE
POC,PROTEIN,UA: NEGATIVE

## 2018-07-01 MED ORDER — PROMETHAZINE HCL 25 MG PO TABS: 12.5000 mg | ORAL_TABLET | Freq: Four times a day (QID) | ORAL | 0 refills | Status: DC | PRN

## 2018-07-01 NOTE — Patient Instructions (Signed)
Judy Lang, I greatly value your feedback.  If you receive a survey following your visit with [place] today, we appreciate you taking the time to fill it out.  Thanks, [name], CNM, WHNP-BC   Call the office [phone]) or go to [facility] if:  You begin to have strong, frequent contractions  Your water breaks.  Sometimes it is a big gush of fluid, sometimes it is just a trickle that keeps getting your panties wet or running down your legs  You have vaginal bleeding.  It is normal to have a small amount of spotting if your cervix was checked.   You don't feel your baby moving like normal.  If you don't, get you something to eat and drink and lay down and focus on feeling your baby move.  You should feel at least 10 movements in 2 hours.  If you don't, you should call the office or go to [facility].    Tdap Vaccine  It is recommended that you get the Tdap vaccine during the third trimester of EACH pregnancy to help protect your baby from getting pertussis (whooping cough)  27-36 weeks is the BEST time to do this so that you can pass the protection on to your baby. During pregnancy is better than after pregnancy, but if you are unable to get it during pregnancy it will be offered at the hospital.   You can get this vaccine with [place], at the health department, your family doctor, or some local pharmacies  Everyone who will be around your baby should also be up-to-date on their vaccines before the baby comes. Adults (who are not pregnant) only need 1 dose of Tdap during adulthood.   Constipation  Drink plenty of fluid, preferably water, throughout the day  Eat foods high in fiber such as fruits, vegetables, and grains  Exercise, such as walking, is a good way to keep your bowels regular  Drink warm fluids, especially warm prune juice, or decaf coffee  Eat a 1/2 cup of real oatmeal (not instant), 1/2 cup applesauce, and 1/2-1 cup warm prune juice every day  If needed,  you may take Colace (docusate sodium) stool softener once or twice a day to help keep the stool soft. If you are pregnant, wait until you are out of your first trimester (12-14 weeks of pregnancy)  If you still are having problems with constipation, you may take Miralax once daily as needed to help keep your bowels regular.  If you are pregnant, wait until you are out of your first trimester (12-14 weeks of pregnancy)  Nausea & Vomiting  Have saltine crackers or pretzels by your bed and eat a few bites before you raise your head out of bed in the morning  Eat small frequent meals throughout the day instead of large meals  Drink plenty of fluids throughout the day to stay hydrated, just don't drink a lot of fluids with your meals.  This can make your stomach fill up faster making you feel sick  Do not brush your teeth right after you eat  Products with real ginger are good for nausea, like ginger ale and ginger hard candy Make sure it says made with real ginger!  Sucking on sour candy like lemon heads is also good for nausea  If your prenatal vitamins make you nauseated, take them at night so you will sleep through the nausea  Sea Bands  If you feel like you need medicine for the nausea &  vomiting please let us know  If you are unable to keep any fluids or food down please let us know        Third Trimester of Pregnancy The third trimester is from week 29 through week 42, months 7 through 9. The third trimester is a time when the fetus is growing rapidly. At the end of the ninth month, the fetus is about 20 inches in length and weighs 6-10 pounds.  BODY CHANGES Your body goes through many changes during pregnancy. The changes vary from woman to woman.   Your weight will continue to increase. You can expect to gain 25-35 pounds (11-16 kg) by the end of the pregnancy.  You may begin to get stretch marks on your hips, abdomen, and breasts.  You may urinate more often because the  fetus is moving lower into your pelvis and pressing on your bladder.  You may develop or continue to have heartburn as a result of your pregnancy.  You may develop constipation because certain hormones are causing the muscles that push waste through your intestines to slow down.  You may develop hemorrhoids or swollen, bulging veins (varicose veins).  You may have pelvic pain because of the weight gain and pregnancy hormones relaxing your joints between the bones in your pelvis. Backaches may result from overexertion of the muscles supporting your posture.  You may have changes in your hair. These can include thickening of your hair, rapid growth, and changes in texture. Some women also have hair loss during or after pregnancy, or hair that feels dry or thin. Your hair will most likely return to normal after your baby is born.  Your breasts will continue to grow and be tender. A yellow discharge may leak from your breasts called colostrum.  Your belly button may stick out.  You may feel short of breath because of your expanding uterus.  You may notice the fetus "dropping," or moving lower in your abdomen.  You may have a bloody mucus discharge. This usually occurs a few days to a week before labor begins.  Your cervix becomes thin and soft (effaced) near your due date. WHAT TO EXPECT AT YOUR PRENATAL EXAMS  You will have prenatal exams every 2 weeks until week 36. Then, you will have weekly prenatal exams. During a routine prenatal visit:  You will be weighed to make sure you and the fetus are growing normally.  Your blood pressure is taken.  Your abdomen will be measured to track your baby's growth.  The fetal heartbeat will be listened to.  Any test results from the previous visit will be discussed.  You may have a cervical check near your due date to see if you have effaced. At around 36 weeks, your caregiver will check your cervix. At the same time, your caregiver will also  perform a test on the secretions of the vaginal tissue. This test is to determine if a type of bacteria, Group B streptococcus, is present. Your caregiver will explain this further. Your caregiver may ask you:  What your birth plan is.  How you are feeling.  If you are feeling the baby move.  If you have had any abnormal symptoms, such as leaking fluid, bleeding, severe headaches, or abdominal cramping.  If you have any questions. Other tests or screenings that may be performed during your third trimester include:  Blood tests that check for low iron levels (anemia).  Fetal testing to check the health, activity level, and growth  of the fetus. Testing is done if you have certain medical conditions or if there are problems during the pregnancy. FALSE LABOR You may feel small, irregular contractions that eventually go away. These are called Braxton Hicks contractions, or false labor. Contractions may last for hours, days, or even weeks before true labor sets in. If contractions come at regular intervals, intensify, or become painful, it is best to be seen by your caregiver.  SIGNS OF LABOR   Menstrual-like cramps.  Contractions that are 5 minutes apart or less.  Contractions that start on the top of the uterus and spread down to the lower abdomen and back.  A sense of increased pelvic pressure or back pain.  A watery or bloody mucus discharge that comes from the vagina. If you have any of these signs before the 37th week of pregnancy, call your caregiver right away. You need to go to the hospital to get checked immediately. HOME CARE INSTRUCTIONS   Avoid all smoking, herbs, alcohol, and unprescribed drugs. These chemicals affect the formation and growth of the baby.  Follow your caregiver's instructions regarding medicine use. There are medicines that are either safe or unsafe to take during pregnancy.  Exercise only as directed by your caregiver. Experiencing uterine cramps is a  good sign to stop exercising.  Continue to eat regular, healthy meals.  Wear a good support bra for breast tenderness.  Do not use hot tubs, steam rooms, or saunas.  Wear your seat belt at all times when driving.  Avoid raw meat, uncooked cheese, cat litter boxes, and soil used by cats. These carry germs that can cause birth defects in the baby.  Take your prenatal vitamins.  Try taking a stool softener (if your caregiver approves) if you develop constipation. Eat more high-fiber foods, such as fresh vegetables or fruit and whole grains. Drink plenty of fluids to keep your urine clear or pale yellow.  Take warm sitz baths to soothe any pain or discomfort caused by hemorrhoids. Use hemorrhoid cream if your caregiver approves.  If you develop varicose veins, wear support hose. Elevate your feet for 15 minutes, 3-4 times a day. Limit salt in your diet.  Avoid heavy lifting, wear low heal shoes, and practice good posture.  Rest a lot with your legs elevated if you have leg cramps or low back pain.  Visit your dentist if you have not gone during your pregnancy. Use a soft toothbrush to brush your teeth and be gentle when you floss.  A sexual relationship may be continued unless your caregiver directs you otherwise.  Do not travel far distances unless it is absolutely necessary and only with the approval of your caregiver.  Take prenatal classes to understand, practice, and ask questions about the labor and delivery.  Make a trial run to the hospital.  Pack your hospital bag.  Prepare the baby's nursery.  Continue to go to all your prenatal visits as directed by your caregiver. SEEK MEDICAL CARE IF:  You are unsure if you are in labor or if your water has broken.  You have dizziness.  You have mild pelvic cramps, pelvic pressure, or nagging pain in your abdominal area.  You have persistent nausea, vomiting, or diarrhea.  You have a bad smelling vaginal discharge.  You  have pain with urination. SEEK IMMEDIATE MEDICAL CARE IF:   You have a fever.  You are leaking fluid from your vagina.  You have spotting or bleeding from your vagina.  You have  severe abdominal cramping or pain.  You have rapid weight loss or gain.  You have shortness of breath with chest pain.  You notice sudden or extreme swelling of your face, hands, ankles, feet, or legs.  You have not felt your baby move in over an hour.  You have severe headaches that do not go away with medicine.  You have vision changes. Document Released: 09/30/2001 Document Revised: 10/11/2013 Document Reviewed: 12/07/2012 [facility] Patient Information 2015 [place], [place]. This information is not intended to replace advice given to you by your health care provider. Make sure you discuss any questions you have with your health care provider.  PROTECT YOURSELF & YOUR BABY FROM THE FLU! Because you are pregnant, we at [facility], along with the Centers for Disease Control (CDC), recommend that you receive the flu vaccine to protect yourself and your baby from the flu. The flu is more likely to cause severe illness in pregnant women than in women of reproductive age who are not pregnant. Changes in the immune system, heart, and lungs during pregnancy make pregnant women (and women up to two weeks postpartum) more prone to severe illness from flu, including illness resulting in hospitalization. Flu also may be harmful for a pregnant woman's developing baby. A common flu symptom is fever, which may be associated with neural tube defects and other adverse outcomes for a developing baby. Getting vaccinated can also help protect a baby after birth from flu. (Mom passes antibodies onto the developing baby during her pregnancy.)  A Flu Vaccine is the Best Protection Against Flu Getting a flu vaccine is the first and most important step in protecting against flu. Pregnant women should get a flu shot and not the live  attenuated influenza vaccine (LAIV), also known as nasal spray flu vaccine. Flu vaccines given during pregnancy help protect both the mother and her baby from flu. Vaccination has been shown to reduce the risk of flu-associated acute respiratory infection in pregnant women by up to one-half. A 2018 study showed that getting a flu shot reduced a pregnant woman's risk of being hospitalized with flu by an average of 40 percent. Pregnant women who get a flu vaccine are also helping to protect their babies from flu illness for the first several months after their birth, when they are too young to get vaccinated.   A Long Record of Safety for Flu Shots in Pregnant Women Flu shots have been given to millions of pregnant women over many years with a good safety record. There is a lot of evidence that flu vaccines can be given safely during pregnancy; though these data are limited for the first trimester. The CDC recommends that pregnant women get vaccinated during any trimester of their pregnancy. It is very important for pregnant women to get the flu shot.   Other Preventive Actions In addition to getting a flu shot, pregnant women should take the same everyday preventive actions the CDC recommends of everyone, including covering coughs, washing hands often, and avoiding people who are sick.  Symptoms and Treatment If you get sick with flu symptoms call your doctor right away. There are antiviral drugs that can treat flu illness and prevent serious flu complications. The CDC recommends prompt treatment for people who have influenza infection or suspected influenza infection and who are at high risk of serious flu complications, such as people with asthma, diabetes (including gestational diabetes), or heart disease. Early treatment of influenza in hospitalized pregnant women has been shown to reduce  the length of the hospital stay.  Symptoms Flu symptoms include fever, cough, sore throat, runny or stuffy nose,  body aches, headache, chills and fatigue. Some people may also have vomiting and diarrhea. People may be infected with the flu and have respiratory symptoms without a fever.  Early Treatment is Important for Pregnant Women Treatment should begin as soon as possible because antiviral drugs work best when started early (within 48 hours after symptoms start). Antiviral drugs can make your flu illness milder and make you feel better faster. They may also prevent serious health problems that can result from flu illness. Oral oseltamivir (Tamiflu) is the preferred treatment for pregnant women because it has the most studies available to suggest that it is safe and beneficial. Antiviral drugs require a prescription from your provider. Having a fever caused by flu infection or other infections early in pregnancy may be linked to birth defects in a baby. In addition to taking antiviral drugs, pregnant women who get a fever should treat their fever with Tylenol (acetaminophen) and contact their provider immediately.  When to [place] If you are pregnant and have any of these signs, seek care immediately:  Difficulty breathing or shortness of breath  Pain or pressure in the chest or abdomen  Sudden dizziness  Confusion  Severe or persistent vomiting  High fever that is not responding to Tylenol (or store brand equivalent)  Decreased or no movement of your baby  [email or web address]

## 2018-07-01 NOTE — Progress Notes (Signed)
LOW-RISK PREGNANCY VISIT [name] MRN [id number]  Date of birth: 1986-05-04 Chief Complaint:   Routine Prenatal Visit (PN2 today; nausea and vomiting; constipated)  History of Present Illness:   Judy Lang is a 32 y.o. [id number] female at [redacted]w[redacted]d with an Estimated Date of Delivery: 10/01/18 being seen today for ongoing management of a low-risk pregnancy.  Today she reports constipation, n/v. Still using THC to help w/ n/v. Contractions: Not present. Vag. Bleeding: None.  Movement: Present. denies leaking of fluid. Review of Systems:   Pertinent items are noted in HPI Denies abnormal vaginal discharge w/ itching/odor/irritation, headaches, visual changes, shortness of breath, chest pain, abdominal pain, severe nausea/vomiting, or problems with urination or bowel movements unless otherwise stated above. Pertinent History Reviewed:  Reviewed past medical,surgical, social, obstetrical and family history.  Reviewed problem list, medications and allergies. Physical Assessment:   Vitals:   07/01/18 0949  BP: 97/60  Pulse: 86  Weight: 107 lb (48.5 kg)  Body mass index is 19.57 kg/m.        Physical Examination:   General appearance: Well appearing, and in no distress  Mental status: Alert, oriented to person, place, and time  Skin: Warm & dry  Cardiovascular: Normal heart rate noted  Respiratory: Normal respiratory effort, no distress  Abdomen: Soft, gravid, nontender  Pelvic: Cervical exam deferred         Extremities: Edema: None  Fetal Status: Fetal Heart Rate (bpm): 150 Fundal Height: 24 cm Movement: Present    Results for orders placed or performed in visit on 07/01/18 (from the past 24 hour(s))  POC Urinalysis Dipstick OB   Collection Time: 07/01/18  9:50 AM  Result Value Ref Range   Color, UA     Clarity, UA     Glucose, UA Negative Negative   Bilirubin, UA     Ketones, UA neg    Spec Grav, UA     Blood, UA neg    pH, UA     POC Protein UA  Negative Negative, Trace   Urobilinogen, UA     Nitrite, UA neg    Leukocytes, UA Negative Negative   Appearance     Odor      Assessment & Plan:  1) Low-risk pregnancy [id number] at 104w6d with an Estimated Date of Delivery: 10/01/18   2) N/V, rx phenergan  3) Constipation> gave printed prevention/relief measures   4) Fetal bilateral CPC w/ neg AFP> will repeat u/s next visit  5) THC use> advised cessation, rx phenergan to help w/ n/v   Meds:  Meds ordered this encounter  Medications  . promethazine (PHENERGAN) 25 MG tablet    Sig: Take 0.5-1 tablets (12.5-25 mg total) by mouth every 6 (six) hours as needed for nausea or vomiting.    Dispense:  30 tablet    Refill:  0    Order Specific Question:   Supervising Provider    Answer:   Tania Ade H [[id number]]   Labs/procedures today: pn2, declined flu shot today- maybe next visit  Plan:  Continue routine obstetrical care   Reviewed: Preterm labor symptoms and general obstetric precautions including but not limited to vaginal bleeding, contractions, leaking of fluid and fetal movement were reviewed in detail with the patient.  All questions were answered  Follow-up: Return in about 3 weeks (around 07/22/2018) for LROB, US:OB F/U .  Orders Placed This Encounter  Procedures  . US OB Follow Up  . POC Urinalysis Dipstick OB  [place], [facility] 07/01/2018 10:21 AM

## 2018-07-02 LAB — CBC
Hematocrit: 36.3 % (ref 34.0–46.6)
Hemoglobin: 11.9 g/dL (ref 11.1–15.9)
MCH: 30.4 pg (ref 26.6–33.0)
MCHC: 32.8 g/dL (ref 31.5–35.7)
MCV: 93 fL (ref 79–97)
PLATELETS: 266 10*3/uL (ref 150–450)
RBC: 3.91 x10E6/uL (ref 3.77–5.28)
RDW: 13.6 % (ref 12.3–15.4)
WBC: 8.6 10*3/uL (ref 3.4–10.8)

## 2018-07-02 LAB — HIV ANTIBODY (ROUTINE TESTING W REFLEX): HIV SCREEN 4TH GENERATION: NONREACTIVE

## 2018-07-02 LAB — GLUCOSE TOLERANCE, 2 HOURS W/ 1HR
GLUCOSE, 1 HOUR: 127 mg/dL (ref 65–179)
GLUCOSE, FASTING: 73 mg/dL (ref 65–91)
Glucose, 2 hour: 95 mg/dL (ref 65–152)

## 2018-07-02 LAB — RPR: RPR Ser Ql: NONREACTIVE

## 2018-07-02 LAB — ANTIBODY SCREEN: Antibody Screen: NEGATIVE

## 2018-07-15 ENCOUNTER — Other Ambulatory Visit: Payer: Self-pay | Admitting: Advanced Practice Midwife

## 2018-07-22 ENCOUNTER — Encounter: Payer: Self-pay | Admitting: Women's Health

## 2018-07-22 ENCOUNTER — Ambulatory Visit (INDEPENDENT_AMBULATORY_CARE_PROVIDER_SITE_OTHER): Payer: Medicaid Other | Admitting: Women's Health

## 2018-07-22 ENCOUNTER — Ambulatory Visit (INDEPENDENT_AMBULATORY_CARE_PROVIDER_SITE_OTHER): Payer: Medicaid Other

## 2018-07-22 VITALS — BP 97/70 | HR 83 | Wt 108.2 lb

## 2018-07-22 DIAGNOSIS — Z3482 Encounter for supervision of other normal pregnancy, second trimester: Secondary | ICD-10-CM

## 2018-07-22 DIAGNOSIS — O350XX0 Maternal care for (suspected) central nervous system malformation in fetus, not applicable or unspecified: Secondary | ICD-10-CM

## 2018-07-22 DIAGNOSIS — Z3A29 29 weeks gestation of pregnancy: Secondary | ICD-10-CM

## 2018-07-22 DIAGNOSIS — Z1389 Encounter for screening for other disorder: Secondary | ICD-10-CM

## 2018-07-22 DIAGNOSIS — Z23 Encounter for immunization: Secondary | ICD-10-CM | POA: Diagnosis not present

## 2018-07-22 DIAGNOSIS — Z331 Pregnant state, incidental: Secondary | ICD-10-CM

## 2018-07-22 DIAGNOSIS — IMO0001 Reserved for inherently not codable concepts without codable children: Secondary | ICD-10-CM

## 2018-07-22 DIAGNOSIS — Z3483 Encounter for supervision of other normal pregnancy, third trimester: Secondary | ICD-10-CM

## 2018-07-22 DIAGNOSIS — O26843 Uterine size-date discrepancy, third trimester: Secondary | ICD-10-CM

## 2018-07-22 LAB — POCT URINALYSIS DIPSTICK OB
Glucose, UA: NEGATIVE
Ketones, UA: NEGATIVE
LEUKOCYTES UA: NEGATIVE
NITRITE UA: NEGATIVE
RBC UA: NEGATIVE

## 2018-07-22 NOTE — Progress Notes (Signed)
   LOW-RISK PREGNANCY VISIT [name] MRN [id number]  Date of birth: 07/12/1986 Chief Complaint:   Routine Prenatal Visit (ultrasound)  History of Present Illness:   Judy Lang is a 32 y.o. [id number] female at [redacted]w[redacted]d with an Estimated Date of Delivery: 10/01/18 being seen today for ongoing management of a low-risk pregnancy.  Today she reports no complaints. Contractions: Not present.  .  Movement: Present. denies leaking of fluid. Review of Systems:   Pertinent items are noted in HPI Denies abnormal vaginal discharge w/ itching/odor/irritation, headaches, visual changes, shortness of breath, chest pain, abdominal pain, severe nausea/vomiting, or problems with urination or bowel movements unless otherwise stated above. Pertinent History Reviewed:  Reviewed past medical,surgical, social, obstetrical and family history.  Reviewed problem list, medications and allergies. Physical Assessment:   Vitals:   07/22/18 1116  BP: 97/70  Pulse: 83  Weight: 108 lb 3.2 oz (49.1 kg)  Body mass index is 19.79 kg/m.        Physical Examination:   General appearance: Well appearing, and in no distress  Mental status: Alert, oriented to person, place, and time  Skin: Warm & dry  Cardiovascular: Normal heart rate noted  Respiratory: Normal respiratory effort, no distress  Abdomen: Soft, gravid, nontender  Pelvic: Cervical exam deferred         Extremities: Edema: None  Fetal Status: Fetal Heart Rate (bpm): 142 u/s Fundal Height: 26 cm Movement: Present   [place] 84+1 cephalic,cx 2.7 cm,posterior pl gr 1,normal ovaries bilat,afi 16 cm,resolved choroid plexus cysts,fhr 142 bpm,EFW 1406 g 27%  Results for orders placed or performed in visit on 07/22/18 (from the past 24 hour(s))  POC Urinalysis Dipstick OB   Collection Time: 07/22/18 11:21 AM  Result Value Ref Range   Color, UA     Clarity, UA     Glucose, UA Negative Negative   Bilirubin, UA     Ketones, UA neg    Spec  Grav, UA     Blood, UA neg    pH, UA     POC Protein UA Trace Negative, Trace   Urobilinogen, UA     Nitrite, UA neg    Leukocytes, UA Negative Negative   Appearance     Odor      Assessment & Plan:  1) Low-risk pregnancy [id number] at [redacted]w[redacted]d with an Estimated Date of Delivery: 10/01/18   2) Resolved bilateral CPC  3) Uterine size <dates- EFW 27%, AFI 16cm today   Meds: No orders of the defined types were placed in this encounter.  Labs/procedures today: u/s, flu shot and tdap  Plan:  Continue routine obstetrical care   Reviewed: Preterm labor symptoms and general obstetric precautions including but not limited to vaginal bleeding, contractions, leaking of fluid and fetal movement were reviewed in detail with the patient.  All questions were answered  Follow-up: Return in about 2 weeks (around 08/05/2018) for [place].  Orders Placed This Encounter  Procedures  . Tdap vaccine greater than or equal to 7yo IM  . Flu Vaccine QUAD 36+ mos IM (Fluarix, Quad PF)  . POC Urinalysis Dipstick OB   TYHESHA DUTSON CNM, [facility] 07/22/2018 11:37 AM

## 2018-07-22 NOTE — Progress Notes (Signed)
[place] 76+7 cephalic,cx 2.7 cm,posterior pl gr 1,normal ovaries bilat,afi 16 cm,resolved choroid plexus cysts,fhr 142 bpm,EFW 1406 g 27%

## 2018-07-22 NOTE — Patient Instructions (Signed)
Judy Lang, I greatly value your feedback.  If you receive a survey following your visit with [place] today, we appreciate you taking the time to fill it out.  Thanks, [name], CNM, WHNP-BC   Call the office [phone]) or go to [facility] if:  You begin to have strong, frequent contractions  Your water breaks.  Sometimes it is a big gush of fluid, sometimes it is just a trickle that keeps getting your panties wet or running down your legs  You have vaginal bleeding.  It is normal to have a small amount of spotting if your cervix was checked.   You don't feel your baby moving like normal.  If you don't, get you something to eat and drink and lay down and focus on feeling your baby move.  You should feel at least 10 movements in 2 hours.  If you don't, you should call the office or go to [facility].    Tdap Vaccine  It is recommended that you get the Tdap vaccine during the third trimester of EACH pregnancy to help protect your baby from getting pertussis (whooping cough)  27-36 weeks is the BEST time to do this so that you can pass the protection on to your baby. During pregnancy is better than after pregnancy, but if you are unable to get it during pregnancy it will be offered at the hospital.   You can get this vaccine with [place], at the health department, your family doctor, or some local pharmacies  Everyone who will be around your baby should also be up-to-date on their vaccines before the baby comes. Adults (who are not pregnant) only need 1 dose of Tdap during adulthood.   Third Trimester of Pregnancy The third trimester is from week 29 through week 42, months 7 through 9. The third trimester is a time when the fetus is growing rapidly. At the end of the ninth month, the fetus is about 20 inches in length and weighs 6-10 pounds.  BODY CHANGES Your body goes through many changes during pregnancy. The changes vary from woman to woman.   Your weight will continue to  increase. You can expect to gain 25-35 pounds (11-16 kg) by the end of the pregnancy.  You may begin to get stretch marks on your hips, abdomen, and breasts.  You may urinate more often because the fetus is moving lower into your pelvis and pressing on your bladder.  You may develop or continue to have heartburn as a result of your pregnancy.  You may develop constipation because certain hormones are causing the muscles that push waste through your intestines to slow down.  You may develop hemorrhoids or swollen, bulging veins (varicose veins).  You may have pelvic pain because of the weight gain and pregnancy hormones relaxing your joints between the bones in your pelvis. Backaches may result from overexertion of the muscles supporting your posture.  You may have changes in your hair. These can include thickening of your hair, rapid growth, and changes in texture. Some women also have hair loss during or after pregnancy, or hair that feels dry or thin. Your hair will most likely return to normal after your baby is born.  Your breasts will continue to grow and be tender. A yellow discharge may leak from your breasts called colostrum.  Your belly button may stick out.  You may feel short of breath because of your expanding uterus.  You may notice the fetus "dropping," or moving lower  in your abdomen.  You may have a bloody mucus discharge. This usually occurs a few days to a week before labor begins.  Your cervix becomes thin and soft (effaced) near your due date. WHAT TO EXPECT AT YOUR PRENATAL EXAMS  You will have prenatal exams every 2 weeks until week 36. Then, you will have weekly prenatal exams. During a routine prenatal visit:  You will be weighed to make sure you and the fetus are growing normally.  Your blood pressure is taken.  Your abdomen will be measured to track your baby's growth.  The fetal heartbeat will be listened to.  Any test results from the previous visit  will be discussed.  You may have a cervical check near your due date to see if you have effaced. At around 36 weeks, your caregiver will check your cervix. At the same time, your caregiver will also perform a test on the secretions of the vaginal tissue. This test is to determine if a type of bacteria, Group B streptococcus, is present. Your caregiver will explain this further. Your caregiver may ask you:  What your birth plan is.  How you are feeling.  If you are feeling the baby move.  If you have had any abnormal symptoms, such as leaking fluid, bleeding, severe headaches, or abdominal cramping.  If you have any questions. Other tests or screenings that may be performed during your third trimester include:  Blood tests that check for low iron levels (anemia).  Fetal testing to check the health, activity level, and growth of the fetus. Testing is done if you have certain medical conditions or if there are problems during the pregnancy. FALSE LABOR You may feel small, irregular contractions that eventually go away. These are called Braxton Hicks contractions, or false labor. Contractions may last for hours, days, or even weeks before true labor sets in. If contractions come at regular intervals, intensify, or become painful, it is best to be seen by your caregiver.  SIGNS OF LABOR   Menstrual-like cramps.  Contractions that are 5 minutes apart or less.  Contractions that start on the top of the uterus and spread down to the lower abdomen and back.  A sense of increased pelvic pressure or back pain.  A watery or bloody mucus discharge that comes from the vagina. If you have any of these signs before the 37th week of pregnancy, call your caregiver right away. You need to go to the hospital to get checked immediately. HOME CARE INSTRUCTIONS   Avoid all smoking, herbs, alcohol, and unprescribed drugs. These chemicals affect the formation and growth of the baby.  Follow your  caregiver's instructions regarding medicine use. There are medicines that are either safe or unsafe to take during pregnancy.  Exercise only as directed by your caregiver. Experiencing uterine cramps is a good sign to stop exercising.  Continue to eat regular, healthy meals.  Wear a good support bra for breast tenderness.  Do not use hot tubs, steam rooms, or saunas.  Wear your seat belt at all times when driving.  Avoid raw meat, uncooked cheese, cat litter boxes, and soil used by cats. These carry germs that can cause birth defects in the baby.  Take your prenatal vitamins.  Try taking a stool softener (if your caregiver approves) if you develop constipation. Eat more high-fiber foods, such as fresh vegetables or fruit and whole grains. Drink plenty of fluids to keep your urine clear or pale yellow.  Take warm sitz baths   to soothe any pain or discomfort caused by hemorrhoids. Use hemorrhoid cream if your caregiver approves.  If you develop varicose veins, wear support hose. Elevate your feet for 15 minutes, 3-4 times a day. Limit salt in your diet.  Avoid heavy lifting, wear low heal shoes, and practice good posture.  Rest a lot with your legs elevated if you have leg cramps or low back pain.  Visit your dentist if you have not gone during your pregnancy. Use a soft toothbrush to brush your teeth and be gentle when you floss.  A sexual relationship may be continued unless your caregiver directs you otherwise.  Do not travel far distances unless it is absolutely necessary and only with the approval of your caregiver.  Take prenatal classes to understand, practice, and ask questions about the labor and delivery.  Make a trial run to the hospital.  Pack your hospital bag.  Prepare the baby's nursery.  Continue to go to all your prenatal visits as directed by your caregiver. SEEK MEDICAL CARE IF:  You are unsure if you are in labor or if your water has broken.  You have  dizziness.  You have mild pelvic cramps, pelvic pressure, or nagging pain in your abdominal area.  You have persistent nausea, vomiting, or diarrhea.  You have a bad smelling vaginal discharge.  You have pain with urination. SEEK IMMEDIATE MEDICAL CARE IF:   You have a fever.  You are leaking fluid from your vagina.  You have spotting or bleeding from your vagina.  You have severe abdominal cramping or pain.  You have rapid weight loss or gain.  You have shortness of breath with chest pain.  You notice sudden or extreme swelling of your face, hands, ankles, feet, or legs.  You have not felt your baby move in over an hour.  You have severe headaches that do not go away with medicine.  You have vision changes. Document Released: 09/30/2001 Document Revised: 10/11/2013 Document Reviewed: 12/07/2012 [facility] Patient Information 2015 [place], [place]. This information is not intended to replace advice given to you by your health care provider. Make sure you discuss any questions you have with your health care provider.

## 2018-08-04 ENCOUNTER — Other Ambulatory Visit: Payer: Self-pay

## 2018-08-04 ENCOUNTER — Inpatient Hospital Stay (HOSPITAL_COMMUNITY)
Admission: AD | Admit: 2018-08-04 | Discharge: 2018-08-13 | DRG: 807 | Disposition: A | Discharge status: Home / Self Care | Payer: Medicaid Other | Attending: Family Medicine | Admitting: Family Medicine

## 2018-08-04 ENCOUNTER — Encounter (HOSPITAL_COMMUNITY): Payer: Self-pay | Admitting: *Deleted

## 2018-08-04 DIAGNOSIS — Z3483 Encounter for supervision of other normal pregnancy, third trimester: Secondary | ICD-10-CM

## 2018-08-04 DIAGNOSIS — O9902 Anemia complicating childbirth: Secondary | ICD-10-CM | POA: Diagnosis not present

## 2018-08-04 DIAGNOSIS — D649 Anemia, unspecified: Secondary | ICD-10-CM | POA: Diagnosis present

## 2018-08-04 DIAGNOSIS — O411230 Chorioamnionitis, third trimester, not applicable or unspecified: Secondary | ICD-10-CM | POA: Diagnosis not present

## 2018-08-04 DIAGNOSIS — Z3A31 31 weeks gestation of pregnancy: Secondary | ICD-10-CM | POA: Diagnosis not present

## 2018-08-04 DIAGNOSIS — O42919 Preterm premature rupture of membranes, unspecified as to length of time between rupture and onset of labor, unspecified trimester: Secondary | ICD-10-CM | POA: Diagnosis present

## 2018-08-04 DIAGNOSIS — O212 Late vomiting of pregnancy: Secondary | ICD-10-CM | POA: Diagnosis not present

## 2018-08-04 DIAGNOSIS — Z3A32 32 weeks gestation of pregnancy: Secondary | ICD-10-CM | POA: Diagnosis not present

## 2018-08-04 DIAGNOSIS — O42913 Preterm premature rupture of membranes, unspecified as to length of time between rupture and onset of labor, third trimester: Principal | ICD-10-CM | POA: Diagnosis present

## 2018-08-04 DIAGNOSIS — O42113 Preterm premature rupture of membranes, onset of labor more than 24 hours following rupture, third trimester: Secondary | ICD-10-CM | POA: Diagnosis not present

## 2018-08-04 DIAGNOSIS — F1290 Cannabis use, unspecified, uncomplicated: Secondary | ICD-10-CM | POA: Diagnosis present

## 2018-08-04 LAB — URINALYSIS, ROUTINE W REFLEX MICROSCOPIC
BILIRUBIN URINE: NEGATIVE
Bacteria, UA: NONE SEEN
Glucose, UA: NEGATIVE mg/dL
HGB URINE DIPSTICK: NEGATIVE
KETONES UR: NEGATIVE mg/dL
LEUKOCYTES UA: NEGATIVE
NITRITE: NEGATIVE
PH: 5 (ref 5.0–8.0)
Protein, ur: 30 mg/dL — AB
Specific Gravity, Urine: 1.036 — ABNORMAL HIGH (ref 1.005–1.030)

## 2018-08-04 LAB — CBC
HCT: 33.2 % — ABNORMAL LOW (ref 36.0–46.0)
Hemoglobin: 11.1 g/dL — ABNORMAL LOW (ref 12.0–15.0)
MCH: 31.2 pg (ref 26.0–34.0)
MCHC: 33.4 g/dL (ref 30.0–36.0)
MCV: 93.3 fL (ref 80.0–100.0)
PLATELETS: 281 10*3/uL (ref 150–400)
RBC: 3.56 MIL/uL — ABNORMAL LOW (ref 3.87–5.11)
RDW: 13.1 % (ref 11.5–15.5)
WBC: 8.8 10*3/uL (ref 4.0–10.5)
nRBC: 0 % (ref 0.0–0.2)

## 2018-08-04 LAB — TYPE AND SCREEN
ABO/RH(D): O POS
ANTIBODY SCREEN: NEGATIVE

## 2018-08-04 LAB — WET PREP, GENITAL
Clue Cells Wet Prep HPF POC: NONE SEEN
Sperm: NONE SEEN
TRICH WET PREP: NONE SEEN
Yeast Wet Prep HPF POC: NONE SEEN

## 2018-08-04 LAB — POCT FERN TEST: POCT FERN TEST: POSITIVE

## 2018-08-04 MED ORDER — MAGNESIUM SULFATE 40 G IN LACTATED RINGERS - SIMPLE
2.5000 g/h | INTRAVENOUS | Status: AC
  Administered 2018-08-04: 2.5 g/h via INTRAVENOUS
  Filled 2018-08-04 (×2): qty 500

## 2018-08-04 MED ORDER — BETAMETHASONE SOD PHOS & ACET 6 (3-3) MG/ML IJ SUSP
12.0000 mg | INTRAMUSCULAR | Status: AC
  Administered 2018-08-04 – 2018-08-05 (×2): 12 mg via INTRAMUSCULAR
  Filled 2018-08-04 (×2): qty 2

## 2018-08-04 MED ORDER — LACTATED RINGERS IV SOLN
INTRAVENOUS | Status: DC
  Administered 2018-08-04 – 2018-08-11 (×11): via INTRAVENOUS

## 2018-08-04 MED ORDER — MIRTAZAPINE 15 MG PO TABS
15.0000 mg | ORAL_TABLET | Freq: Every day | ORAL | Status: DC
  Administered 2018-08-04 – 2018-08-06 (×3): 15 mg via ORAL
  Filled 2018-08-04 (×5): qty 1

## 2018-08-04 MED ORDER — DOCUSATE SODIUM 100 MG PO CAPS
100.0000 mg | ORAL_CAPSULE | Freq: Every day | ORAL | Status: DC
  Administered 2018-08-06 – 2018-08-11 (×6): 100 mg via ORAL
  Filled 2018-08-04 (×7): qty 1

## 2018-08-04 MED ORDER — SODIUM CHLORIDE 0.9 % IV SOLN
2.0000 g | Freq: Four times a day (QID) | INTRAVENOUS | Status: AC
  Administered 2018-08-04 – 2018-08-06 (×8): 2 g via INTRAVENOUS
  Filled 2018-08-04 (×8): qty 2

## 2018-08-04 MED ORDER — SODIUM CHLORIDE 0.9 % IV SOLN
250.0000 mg | Freq: Four times a day (QID) | INTRAVENOUS | Status: AC
  Administered 2018-08-04 – 2018-08-06 (×8): 250 mg via INTRAVENOUS
  Filled 2018-08-04 (×8): qty 5

## 2018-08-04 MED ORDER — HYDROMORPHONE HCL 2 MG/ML IJ SOLN
2.0000 mg | Freq: Once | INTRAMUSCULAR | Status: AC
  Administered 2018-08-04: 2 mg via INTRAVENOUS
  Filled 2018-08-04: qty 1

## 2018-08-04 MED ORDER — ACETAMINOPHEN 325 MG PO TABS
650.0000 mg | ORAL_TABLET | ORAL | Status: DC | PRN
  Administered 2018-08-04 – 2018-08-11 (×11): 650 mg via ORAL
  Filled 2018-08-04 (×11): qty 2

## 2018-08-04 MED ORDER — LACTATED RINGERS IV SOLN: INTRAVENOUS | Status: DC

## 2018-08-04 MED ORDER — AMOXICILLIN 500 MG PO CAPS
500.0000 mg | ORAL_CAPSULE | Freq: Three times a day (TID) | ORAL | Status: AC
  Administered 2018-08-06 – 2018-08-11 (×15): 500 mg via ORAL
  Filled 2018-08-04 (×18): qty 1

## 2018-08-04 MED ORDER — MAGNESIUM SULFATE BOLUS VIA INFUSION
4.0000 g | Freq: Once | INTRAVENOUS | Status: AC
  Administered 2018-08-04: 4 g via INTRAVENOUS
  Filled 2018-08-04: qty 500

## 2018-08-04 MED ORDER — CALCIUM CARBONATE ANTACID 500 MG PO CHEW: 2.0000 | CHEWABLE_TABLET | ORAL | Status: DC | PRN

## 2018-08-04 MED ORDER — PRENATAL MULTIVITAMIN CH
1.0000 | ORAL_TABLET | Freq: Every day | ORAL | Status: DC
  Administered 2018-08-07 – 2018-08-11 (×5): 1 via ORAL
  Filled 2018-08-04 (×7): qty 1

## 2018-08-04 MED ORDER — PROMETHAZINE HCL 25 MG/ML IJ SOLN
25.0000 mg | Freq: Four times a day (QID) | INTRAMUSCULAR | Status: DC | PRN
  Administered 2018-08-04 – 2018-08-08 (×5): 25 mg via INTRAVENOUS
  Filled 2018-08-04 (×5): qty 1

## 2018-08-04 MED ORDER — ERYTHROMYCIN BASE 250 MG PO TABS
250.0000 mg | ORAL_TABLET | Freq: Four times a day (QID) | ORAL | Status: AC
  Administered 2018-08-06 – 2018-08-11 (×20): 250 mg via ORAL
  Filled 2018-08-04 (×22): qty 1

## 2018-08-04 NOTE — Progress Notes (Signed)
Magnesium 4 gram bolus started.

## 2018-08-04 NOTE — Progress Notes (Signed)
Called to evaluate patient complaining of contraction pains and breathing through them  Blood pressure (!) 153/97, pulse 93, temperature 98.7 F (37.1 C), temperature source Oral, resp. rate 13, height 5\' 2"  (1.575 m), weight 49.9 kg, SpO2 99 %. Cervical exam: 1/50/-3 FHT: baseline 140, mod variability, + accels, no decels Toco: ctx q3-77minutes  A/P 32 yo [id number] at [redacted]w[redacted]d with PPROM - Cervical exam unchanged since admission - Will increase tocolysis with 2.5 gm/hr of magnesium sulfate peding administration of BMZ - Continue close monitoring - Pain management prn
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
## 2018-08-04 NOTE — MAU Note (Signed)
Pt states she got up @ 0100, was having back pain, then started feeling ? Leaking around 0600.  By 0700 fluid had soaked through a wash cloth & her clothes.  Has had some abdominal tightening since yesterday, also had vomiting yesterday.   Has had a HA for the past 2 days.

## 2018-08-05 ENCOUNTER — Encounter: Payer: Medicaid Other | Admitting: Advanced Practice Midwife

## 2018-08-05 LAB — GC/CHLAMYDIA PROBE AMP (~~LOC~~) NOT AT ARMC
CHLAMYDIA, DNA PROBE: NEGATIVE
Neisseria Gonorrhea: NEGATIVE

## 2018-08-05 MED ORDER — FENTANYL CITRATE (PF) 100 MCG/2ML IJ SOLN
100.0000 ug | Freq: Once | INTRAMUSCULAR | Status: AC
  Administered 2018-08-05: 100 ug via INTRAVENOUS
  Filled 2018-08-05: qty 2

## 2018-08-05 MED ORDER — NIFEDIPINE 10 MG PO CAPS
10.0000 mg | ORAL_CAPSULE | Freq: Once | ORAL | Status: AC
  Administered 2018-08-05: 10 mg via ORAL
  Filled 2018-08-05: qty 1

## 2018-08-05 NOTE — Progress Notes (Signed)
Dr. Elonda Husky made aware of patient's contractions and pain level. Order received for Procardia and Fentanyl. Will Continue to monitor. Toya Smothers, RN

## 2018-08-05 NOTE — Progress Notes (Signed)
Patient ID: Judy Lang, female   DOB: [birth year], 32 y.o.   MRN: [id number] [place]) NOTE  Judy Lang is a 32 y.o. [id number] at [redacted]w[redacted]d  who is admitted for PROM.    Fetal presentation is cephalic. Length of Stay:  1  Days  Date of admission:08/04/2018  Subjective: Patient reports occasional contractions not as painful as yesterday Patient reports the fetal movement as active. Patient reports uterine contraction  activity as none. Patient reports  vaginal bleeding as none. Patient describes fluid per vagina as Clear.  Vitals:  Blood pressure (!) 90/43, pulse 84, temperature (!) 97.5 F (36.4 C), temperature source Oral, resp. rate 16, height 5\' 2"  (1.575 m), weight 49.9 kg, SpO2 98 %. Vitals:   08/05/18 0400 08/05/18 0451 08/05/18 0600 08/05/18 0811  BP:  94/64  (!) 90/43  Pulse:  73  84  Resp: 18 18 16    Temp:  98 F (36.7 C)  (!) 97.5 F (36.4 C)  TempSrc:  Oral  Oral  SpO2:  98%  98%  Weight:      Height:       Physical Examination:  General appearance - alert, well appearing, and in no distress Abdomen:  Soft, gravid, non tender Cervical Exam: Not evaluated. Extremities: extremities normal, atraumatic, no cyanosis or edema and no edema, redness or tenderness in the calves or thighs with DTRs 2+ bilaterally Membranes:ruptured  Fetal Monitoring:  Baseline: 125 bpm, Variability: Good {> 6 bpm), Accelerations: Reactive and Decelerations: Absent   reactive  Labs:  Results for orders placed or performed during the hospital encounter of 08/04/18 (from the past 24 hour(s))  Wet prep, genital   Collection Time: 08/04/18  9:42 AM  Result Value Ref Range   Yeast Wet Prep HPF POC NONE SEEN NONE SEEN   Trich, Wet Prep NONE SEEN NONE SEEN   Clue Cells Wet Prep HPF POC NONE SEEN NONE SEEN   WBC, Wet Prep HPF POC FEW (A) NONE SEEN   Sperm NONE SEEN   Fern Test   Collection Time: 08/04/18  9:46 AM  Result Value Ref Range   POCT Fern  Test Positive = ruptured amniotic membanes   CBC on admission   Collection Time: 08/04/18 10:11 AM  Result Value Ref Range   WBC 8.8 4.0 - 10.5 K/uL   RBC 3.56 (L) 3.87 - 5.11 MIL/uL   Hemoglobin 11.1 (L) 12.0 - 15.0 g/dL   HCT 33.2 (L) 36.0 - 46.0 %   MCV 93.3 80.0 - 100.0 fL   MCH 31.2 26.0 - 34.0 pg   MCHC 33.4 30.0 - 36.0 g/dL   RDW 13.1 11.5 - 15.5 %   Platelets 281 150 - 400 K/uL   nRBC 0.0 0.0 - 0.2 %  Type and screen [place]   Collection Time: 08/04/18 10:11 AM  Result Value Ref Range   ABO/RH(D) O POS    Antibody Screen NEG    Sample Expiration      08/07/2018 Performed at [facility], [address], [place], [place] [address]     Imaging Studies:      Medications:  Scheduled . [START ON 08/06/2018] amoxicillin  500 mg Oral Q8H  . betamethasone acetate-betamethasone sodium phosphate  12 mg Intramuscular Q24H  . docusate sodium  100 mg Oral Daily  . [START ON 08/06/2018] erythromycin  250 mg Oral Q6H  . mirtazapine  15 mg Oral QHS  . prenatal multivitamin  1 tablet Oral  Q1200   I have reviewed the patient's current medications.  ASSESSMENT: [id number] [redacted]w[redacted]d Estimated Date of Delivery: 10/01/18  Patient Active Problem List   Diagnosis Date Noted  . Preterm premature rupture of membranes (PPROM) with unknown onset of labor 08/04/2018  . Supervision of normal pregnancy 03/24/2018  . History of postpartum hemorrhage, currently pregnant 03/02/2018  . Underweight 07/13/2017  . Pectus excavatum 07/13/2017  . Marijuana use 07/19/2013  . 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 Abnormal pap 02/02/2013    PLAN: - Patient will receive second dose of BMZ this morning - Will discontinue tocolysis with magnesium sulfate later today - Continue monitoring for si/sx of chorio - Continue latency antibiotics   Antonela Freiman 08/05/2018,9:30 AM

## 2018-08-05 NOTE — Progress Notes (Signed)
Pt reporting increased abdominal/contraction pain 8/10. Dr. Elly Modena contacted and made aware of the patient's pain level. Order received for 151mcg of fentanyl. Pt denies any vaginal bleeding. Will continue to monitor. Toya Smothers, RN

## 2018-08-06 LAB — CULTURE, BETA STREP (GROUP B ONLY)

## 2018-08-06 MED ORDER — HYDROMORPHONE HCL 2 MG/ML IJ SOLN
2.0000 mg | Freq: Once | INTRAMUSCULAR | Status: AC
  Administered 2018-08-06: 2 mg via INTRAVENOUS
  Filled 2018-08-06: qty 1

## 2018-08-06 NOTE — Progress Notes (Signed)
Patient ID: Judy Lang, female   DOB: Jan 06, 1986, 32 y.o.   MRN: [id number] [place]) NOTE  Judy Lang is a 32 y.o. [id number] at [redacted]w[redacted]d  who is admitted for PPROM.    Fetal presentation is cephalic. Length of Stay:  2  Days  Date of admission:08/04/2018  Subjective: Patient reports occasional contractions  Patient reports the fetal movement as active. Patient reports uterine contraction  activity as none. Patient reports  vaginal bleeding as none. Patient describes fluid per vagina as Clear.  Vitals:  Blood pressure 96/62, pulse 63, temperature 98.6 F (37 C), temperature source Oral, resp. rate 16, height 5\' 2"  (1.575 m), weight 49.9 kg, SpO2 99 %. Vitals:   08/05/18 1955 08/05/18 2309 08/06/18 0440 08/06/18 0832  BP: 100/74 (!) 97/59 (!) 91/55 96/62  Pulse: 74 67 63 63  Resp: 20 20 20 16   Temp: 98.9 F (37.2 C) 98.9 F (37.2 C) 98.5 F (36.9 C) 98.6 F (37 C)  TempSrc: Oral Oral Oral Oral  SpO2: 99% 98% 100% 99%  Weight:      Height:       Physical Examination:  General appearance - alert, well appearing, and in no distress Abdomen:  Soft, gravid, non tender Cervical Exam: Not evaluated. Extremities: extremities normal, atraumatic, no cyanosis or edema and no edema, redness or tenderness in the calves or thighs with DTRs 2+ bilaterally Membranes:ruptured  Fetal Monitoring:  Baseline: 125 bpm, Variability: Good {> 6 bpm), Accelerations: Reactive and Decelerations: Absent   reactive Toco: irregular contractions Labs:  No results found for this or any previous visit (from the past 24 hour(s)).  Imaging Studies:      Medications:  Scheduled . amoxicillin  500 mg Oral Q8H  . docusate sodium  100 mg Oral Daily  . erythromycin  250 mg Oral Q6H  . mirtazapine  15 mg Oral QHS  . prenatal multivitamin  1 tablet Oral Q1200   I have reviewed the patient's current medications.  ASSESSMENT: [id number] [redacted]w[redacted]d Estimated Date of  Delivery: 10/01/18  Patient Active Problem List   Diagnosis Date Noted  . Preterm premature rupture of membranes (PPROM) with unknown onset of labor 08/04/2018  . Supervision of normal pregnancy 03/24/2018  . History of postpartum hemorrhage, currently pregnant 03/02/2018  . Underweight 07/13/2017  . Pectus excavatum 07/13/2017  . Marijuana use 07/19/2013  . 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 Abnormal pap 02/02/2013    PLAN: - Patient completed BMZ - Continue monitoring for si/sx of chorio - Continue latency antibiotics - Continue antepartum care   Yiannis Tulloch 08/06/2018,10:24 AM

## 2018-08-07 ENCOUNTER — Encounter (HOSPITAL_COMMUNITY): Payer: Self-pay

## 2018-08-07 MED ORDER — LIDOCAINE HCL (PF) 1 % IJ SOLN
30.0000 mL | INTRAMUSCULAR | Status: DC | PRN
  Filled 2018-08-07: qty 30

## 2018-08-07 MED ORDER — OXYTOCIN BOLUS FROM INFUSION: 500.0000 mL | Freq: Once | INTRAVENOUS | Status: DC

## 2018-08-07 MED ORDER — ONDANSETRON HCL 4 MG/2ML IJ SOLN
4.0000 mg | Freq: Four times a day (QID) | INTRAMUSCULAR | Status: DC | PRN
  Administered 2018-08-07 – 2018-08-11 (×5): 4 mg via INTRAVENOUS
  Filled 2018-08-07 (×4): qty 2

## 2018-08-07 MED ORDER — LACTATED RINGERS IV BOLUS
1000.0000 mL | Freq: Once | INTRAVENOUS | Status: AC
  Administered 2018-08-07: 1000 mL via INTRAVENOUS

## 2018-08-07 MED ORDER — FENTANYL CITRATE (PF) 100 MCG/2ML IJ SOLN
100.0000 ug | Freq: Once | INTRAMUSCULAR | Status: AC
  Administered 2018-08-07: 100 ug via INTRAVENOUS
  Filled 2018-08-07: qty 2

## 2018-08-07 MED ORDER — FENTANYL CITRATE (PF) 100 MCG/2ML IJ SOLN
100.0000 ug | INTRAMUSCULAR | Status: DC | PRN
  Administered 2018-08-07 – 2018-08-08 (×4): 100 ug via INTRAVENOUS
  Filled 2018-08-07 (×5): qty 2

## 2018-08-07 MED ORDER — ONDANSETRON HCL 4 MG/2ML IJ SOLN
4.0000 mg | Freq: Once | INTRAMUSCULAR | Status: AC
  Administered 2018-08-07: 4 mg via INTRAVENOUS
  Filled 2018-08-07: qty 2

## 2018-08-07 MED ORDER — LACTATED RINGERS IV SOLN: 500.0000 mL | INTRAVENOUS | Status: DC | PRN

## 2018-08-07 MED ORDER — LACTATED RINGERS IV SOLN
INTRAVENOUS | Status: DC
  Administered 2018-08-08 (×2): via INTRAVENOUS

## 2018-08-07 MED ORDER — MIRTAZAPINE 15 MG PO TABS
15.0000 mg | ORAL_TABLET | Freq: Every day | ORAL | Status: DC
  Administered 2018-08-07: 15 mg via ORAL
  Filled 2018-08-07 (×2): qty 1

## 2018-08-07 MED ORDER — SOD CITRATE-CITRIC ACID 500-334 MG/5ML PO SOLN
30.0000 mL | ORAL | Status: DC | PRN
  Administered 2018-08-07: 30 mL via ORAL
  Filled 2018-08-07: qty 15

## 2018-08-07 MED ORDER — METOCLOPRAMIDE HCL 5 MG/ML IJ SOLN
10.0000 mg | Freq: Four times a day (QID) | INTRAMUSCULAR | Status: DC | PRN
  Administered 2018-08-07: 10 mg via INTRAVENOUS
  Filled 2018-08-07: qty 2

## 2018-08-07 MED ORDER — OXYTOCIN 40 UNITS IN LACTATED RINGERS INFUSION - SIMPLE MED
2.5000 [IU]/h | INTRAVENOUS | Status: DC
  Filled 2018-08-07: qty 1000

## 2018-08-07 NOTE — Progress Notes (Signed)
LABOR PROGRESS NOTE  Judy Lang is a 32 y.o. [phone] at [redacted]w[redacted]d  admitted for PPROM  Subjective: Continues to have painful contractions mostly in back Also feeling more pressure  Complains of nausea and acid reflux type symptoms Does not feel phenergan and zofran have been helpful   Objective: BP (!) 98/57 (BP Location: Left Arm)   Pulse 65   Temp 98.6 F (37 C) (Oral)   Resp 16   Ht 5\' 2"  (1.575 m)   Wt 49.9 kg   LMP  (LMP Unknown)   SpO2 97%   BMI 20.12 kg/m  or  Vitals:   08/07/18 1150 08/07/18 1247 08/07/18 1251 08/07/18 1345  BP: 118/79  (!) 127/95 (!) 98/57  Pulse: 68  92 65  Resp: 17  20 16   Temp: 98.5 F (36.9 C) 98.6 F (37 C)    TempSrc: Oral Oral    SpO2: 97%     Weight:      Height:         Dilation: 2 Effacement (%): 70 Cervical Position: Middle Station: -1 Presentation: Vertex Exam by:: Mary Martinique Johnson, RN  FHT: baseline rate 130s, moderate varibility, + acel, no decel Toco: regular every 4-5 minutes  Labs: Lab Results  Component Value Date   WBC 8.8 08/04/2018   HGB 11.1 (L) 08/04/2018   HCT 33.2 (L) 08/04/2018   MCV 93.3 08/04/2018   PLT 281 08/04/2018    Patient Active Problem List   Diagnosis Date Noted  . Preterm premature rupture of membranes (PPROM) with unknown onset of labor 08/04/2018  . Supervision of normal pregnancy 03/24/2018  . History of postpartum hemorrhage, currently pregnant 03/02/2018  . Underweight 07/13/2017  . Pectus excavatum 07/13/2017  . Marijuana use 07/19/2013  . 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 Abnormal pap 02/02/2013    Assessment / Plan: 32 y.o. [id number] at [redacted]w[redacted]d here for PPROM  Labor: latent with regular painful contractions and cervical change Fetal Wellbeing:  Cat I strip Pain Control:  IV pain meds Anticipated MOD:  SVD Nausea/Vomiting: add reglan and bicitra  Aura Camps, MD OB Fellow  08/07/2018, 2:37 PM

## 2018-08-07 NOTE — Progress Notes (Signed)
To labor and delivery via bed. Report given to [facility], [place].

## 2018-08-07 NOTE — Progress Notes (Signed)
Patient ID: Judy Lang, female   DOB: 01/10/1986, 32 y.o.   MRN: [id number] [place]) NOTE  Judy Lang is a 32 y.o. [phone] at [redacted]w[redacted]d  who is admitted for PPROM.    Fetal presentation is cephalic. Length of Stay:  3  Days  Date of admission:08/04/2018  Subjective: Patient reports occasional contractions  Patient reports the fetal movement as active. Patient reports uterine contraction  activity as none. Patient reports  vaginal bleeding as none. Patient describes fluid per vagina as Clear.  Vitals:  Blood pressure 94/60, pulse 68, temperature 98.5 F (36.9 C), temperature source Oral, resp. rate 16, height 5\' 2"  (1.575 m), weight 49.9 kg, SpO2 99 %. Vitals:   08/06/18 1952 08/06/18 2337 08/07/18 0500 08/07/18 0912  BP: 98/65 (!) 84/52 (!) 86/54 94/60  Pulse: 64 66 62 68  Resp: 16 16 16 16   Temp: 98.6 F (37 C) 97.9 F (36.6 C) 97.7 F (36.5 C) 98.5 F (36.9 C)  TempSrc: Oral Oral Oral Oral  SpO2: 99% 99% 99% 99%  Weight:      Height:       Physical Examination:  General appearance - alert, well appearing, and in no distress Abdomen:  Soft, gravid, non tender Cervical Exam: Not evaluated. Extremities: extremities normal, atraumatic, no cyanosis or edema and no edema, redness or tenderness in the calves or thighs with DTRs 2+ bilaterally Membranes:ruptured  Fetal Monitoring:  Baseline: 135 bpm, Variability: Good {> 6 bpm), Accelerations: Reactive and Decelerations: Absent   reactive Toco: irregular contractions Labs:  No results found for this or any previous visit (from the past 24 hour(s)).  Imaging Studies:      Medications:  Scheduled . amoxicillin  500 mg Oral Q8H  . docusate sodium  100 mg Oral Daily  . erythromycin  250 mg Oral Q6H  . mirtazapine  15 mg Oral QHS  . prenatal multivitamin  1 tablet Oral Q1200   I have reviewed the patient's current medications.  ASSESSMENT: [id number] [redacted]w[redacted]d Estimated Date of  Delivery: 10/01/18  Patient Active Problem List   Diagnosis Date Noted  . Preterm premature rupture of membranes (PPROM) with unknown onset of labor 08/04/2018  . Supervision of normal pregnancy 03/24/2018  . History of postpartum hemorrhage, currently pregnant 03/02/2018  . Underweight 07/13/2017  . Pectus excavatum 07/13/2017  . Marijuana use 07/19/2013  . 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 Abnormal pap 02/02/2013    PLAN: - Patient completed BMZ - Continue monitoring for si/sx of chorio - Continue latency antibiotics - Continue antepartum care   Cathren Sween 08/07/2018,9:52 AM

## 2018-08-07 NOTE — Anesthesia Pain Management Evaluation Note (Signed)
  CRNA Pain Management Visit Note  Patient: Judy Lang, 32 y.o., female  "Hello I am a member of the anesthesia team at [facility]. We have an anesthesia team available at all times to provide care throughout the hospital, including epidural management and anesthesia for C-section. I don't know your plan for the delivery whether it a natural birth, water birth, IV sedation, nitrous supplementation, doula or epidural, but we want to meet your pain goals."   1.Was your pain managed to your expectations on prior hospitalizations?   Yes   2.What is your expectation for pain management during this hospitalization?     IV pain meds  3.How can we help you reach that goal? Support prn  Record the patient's initial score and the patient's pain goal.   Pain: 4  Pain Goal: 3 [facility] wants you to be able to say your pain was always managed very well.  [facility] 08/07/2018

## 2018-08-08 DIAGNOSIS — Z3A32 32 weeks gestation of pregnancy: Secondary | ICD-10-CM

## 2018-08-08 MED ORDER — PROMETHAZINE HCL 25 MG PO TABS
25.0000 mg | ORAL_TABLET | Freq: Four times a day (QID) | ORAL | Status: DC | PRN
  Administered 2018-08-09 – 2018-08-11 (×3): 25 mg via ORAL
  Filled 2018-08-08 (×3): qty 1

## 2018-08-08 MED ORDER — CYCLOBENZAPRINE HCL 10 MG PO TABS
10.0000 mg | ORAL_TABLET | Freq: Once | ORAL | Status: AC
  Administered 2018-08-08: 10 mg via ORAL
  Filled 2018-08-08: qty 1

## 2018-08-08 MED ORDER — MIRTAZAPINE 15 MG PO TABS
15.0000 mg | ORAL_TABLET | Freq: Every day | ORAL | Status: DC
  Administered 2018-08-08 – 2018-08-10 (×3): 15 mg via ORAL
  Filled 2018-08-08 (×6): qty 1

## 2018-08-08 MED ORDER — PANTOPRAZOLE SODIUM 40 MG PO TBEC
40.0000 mg | DELAYED_RELEASE_TABLET | Freq: Every day | ORAL | Status: DC
  Administered 2018-08-08 – 2018-08-13 (×6): 40 mg via ORAL
  Filled 2018-08-08 (×6): qty 1

## 2018-08-08 NOTE — Progress Notes (Signed)
Patient ID: Judy Lang, female   DOB: [birth year], 32 y.o.   MRN: [id number] Pt is asleep. Her contractions are very mild and irregular. Will transfer to antepartum unit. Cont current care.   Darion Juhasz L. Harraway-Smith, M.D., [name]

## 2018-08-08 NOTE — Progress Notes (Signed)
OB/GYN Faculty Practice: Labor Progress Note  Subjective: Doing well. Feeling hot/sweaty. Not much pressure, pain with contractions right now. Got Remeron to help with sleep. Has had N/V off/on all day - got Reglan, Bicitra. Usually smokes MJ, Zofran/phenergan have not helped.   Objective: BP 101/66   Pulse 66   Temp 98.1 F (36.7 C) (Axillary)   Resp 16   Ht 5\' 2"  (1.575 m)   Wt 49.9 kg   LMP  (LMP Unknown)   SpO2 97%   BMI 20.12 kg/m  Gen: thin, chronically-ill appearing Dilation: 2 Effacement (%): 70 Cervical Position: Middle Station: -1 Presentation: Vertex Exam by:: Mary Martinique Johnson, RN   Assessment and Plan: 32 y.o. [phone] [redacted]w[redacted]d here with PPROM.   Labor: PPROM 10/16 - on Mg++, latency antibiotics. Expectant management at this time. Not feeling contractions at this time, was feeling them every 2-3 minutes (toco does pick them up).  -- pain control: nothing at this time -- PPH Risk: low   Fetal Well-Being: EFW 1406g (27%) at 29w6. Cephalic by BSUS.Marland Kitchen Neuroprotective Mg++. -- s/p BMZ 10/16-17  -- Category I - continuous fetal monitoring  -- GBS negative    Jhett Fretwell S. Juleen China, DO OB/GYN Fellow, Faculty Practice  12:52 AM

## 2018-08-08 NOTE — Progress Notes (Signed)
Patient ID: Judy Lang, female   DOB: 1986-07-14, 32 y.o.   MRN: [id number] [place] COMPREHENSIVE PROGRESS NOTE  [name] is a 32 y.o. [phone] at [redacted]w[redacted]d  who is admitted for PROM.   Fetal presentation is cephalic. Length of Stay:  4  Days  Subjective: Pt on L&D at present. She reports continued contractions that are painful in nature and back pain.    Patient reports good fetal movement.  She reports no beeding and no loss of fluid per vagina.  Vitals:  Blood pressure (!) 91/51, pulse 61, temperature 98.1 F (36.7 C), temperature source Oral, resp. rate 16, height 5\' 2"  (1.575 m), weight 49.9 kg, SpO2 97 %. Physical Examination: General appearance - alert, well appearing, and in no distress Abdomen - soft, nontender, nondistended, no masses or organomegaly Gravid. Contractions not palpating at present Extremities - peripheral pulses normal, no pedal edema, no clubbing or cyanosis Cervical Exam: Not evaluated. Membranes:ruptured  Fetal Monitoring:  Baseline: 140's bpm, Variability: Good {> 6 bpm), Accelerations: Reactive and toco: irreg cotnractions  Labs:  Results for orders placed or performed during the hospital encounter of 08/04/18 (from the past 24 hour(s))  Type and screen [place]   Collection Time: 08/08/18  8:58 AM  Result Value Ref Range   ABO/RH(D) O POS    Antibody Screen NEG    Sample Expiration      08/11/2018 Performed at [facility], [address], [place], [place] [address]     Imaging Studies:    None pending   Medications:  Scheduled . amoxicillin  500 mg Oral Q8H  . docusate sodium  100 mg Oral Daily  . erythromycin  250 mg Oral Q6H  . mirtazapine  15 mg Oral QHS  . oxytocin 40 units in LR 1000 mL  500 mL Intravenous Once  . prenatal multivitamin  1 tablet Oral Q1200   I have reviewed the patient's current medications.  ASSESSMENT: Patient Active Problem List   Diagnosis Date Noted  .  Preterm premature rupture of membranes (PPROM) with unknown onset of labor 08/04/2018  . Supervision of normal pregnancy 03/24/2018  . History of postpartum hemorrhage, currently pregnant 03/02/2018  . Underweight 07/13/2017  . Pectus excavatum 07/13/2017  . Marijuana use 07/19/2013  . 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 Abnormal pap 02/02/2013  . Multiple fractures of pelvis 06/17/2012    PLAN: Patient completed BMZ Continue monitoring for si/sx of chorio Continue latency antibiotics Continue routine antenatal care. Keep on L&D now due to continued contractions and pain Check digitally prn suspicion of ACTIVE labor  Chelby Salata Harraway-Smith 08/08/2018,11:44 AM

## 2018-08-09 DIAGNOSIS — O42919 Preterm premature rupture of membranes, unspecified as to length of time between rupture and onset of labor, unspecified trimester: Secondary | ICD-10-CM

## 2018-08-09 MED ORDER — CYCLOBENZAPRINE HCL 10 MG PO TABS
10.0000 mg | ORAL_TABLET | Freq: Three times a day (TID) | ORAL | Status: DC | PRN
  Administered 2018-08-09 – 2018-08-11 (×3): 10 mg via ORAL
  Filled 2018-08-09 (×4): qty 1

## 2018-08-09 MED ORDER — ENSURE ENLIVE PO LIQD
237.0000 mL | Freq: Two times a day (BID) | ORAL | Status: DC
  Administered 2018-08-09 – 2018-08-11 (×4): 237 mL via ORAL
  Filled 2018-08-09 (×5): qty 237

## 2018-08-09 MED ORDER — POLYETHYLENE GLYCOL 3350 17 G PO PACK
17.0000 g | PACK | Freq: Every day | ORAL | Status: DC
  Administered 2018-08-09 – 2018-08-11 (×3): 17 g via ORAL
  Filled 2018-08-09 (×4): qty 1

## 2018-08-09 NOTE — Progress Notes (Signed)
Initial Nutrition Assessment  DOCUMENTATION CODES:   Not applicable  INTERVENTION:  Regular diet, snacks TID ( pt to order) Ensure Enlive BID  NUTRITION DIAGNOSIS:  Increased nutrient needs related to (pregnancy and fetal growth requirements) as evidenced by (32 weeks IUP).   GOAL:  Patient will meet greater than or equal to 90% of their needs, Weight gain MONITOR:  Weight trends REASON FOR ASSESSMENT:  Antenatal, Consult Assessment of nutrition requirement/status  ASSESSMENT:  32 3/7 weeks IUP, PROM. Pt has Hx of nausea and decreased appetitie. she reports that appetitie is gradually improving and is making an effort to eat 3 meals plus snacks. Current weight  is only 1 1/2 lbs above weight at 12 weeks.  Likes Ensure, accepts adding this to diet along with snacks TID   Reports she typically has a hard time keeping weight on, but did gain adeq weight with previous pregnancy's  Husband brings pt food from outside in addition to ordered meals   Diet Order:   Diet Order            Diet regular Room service appropriate? Yes; Fluid consistency: Thin  Diet effective now             EDUCATION NEEDS:  No education needs have been identified at this time  Skin:  Skin Assessment: Reviewed RN Assessment  Last BM:   10/20  Height:   Ht Readings from Last 1 Encounters:  08/04/18 5\' 2"  (1.575 m)    Weight:   Wt Readings from Last 1 Encounters:  08/09/18 48.5 kg    Ideal Body Weight:   110 lbs  BMI:  Body mass index is 19.57 kg/m.  Estimated Nutritional Needs:   Kcal:  1600-1800  Protein:  75-85 g   Fluid:  1.9 L    Weyman Rodney M.Fredderick Severance LDN Neonatal Nutrition Support Specialist/RD III Pager [phone]      Phone [phone]

## 2018-08-09 NOTE — Progress Notes (Signed)
Patient ID: Judy Lang, female   DOB: 12-30-1985, 32 y.o.   MRN: [id number] [place] COMPREHENSIVE PROGRESS NOTE  Judy Lang is a 32 y.o. [phone] at [redacted]w[redacted]d  who is admitted for PROM.   Fetal presentation is cephalic. Length of Stay:  5  Days  Subjective: Events of last 24 hrs noted Pt reports some mild back pain but less than yesterday. Denies ut ctx or VB. + FM. Tolerating diet. Occ N/V.   Vitals:  Blood pressure 114/78, pulse 66, temperature 98.1 F (36.7 C), temperature source Oral, resp. rate 16, height 5\' 2"  (1.575 m), weight 48.5 kg, SpO2 100 %.   Physical Examination: Lungs clear Heart RRR Abd soft +BS gravid non tender Cervix: deferred Ext non tender  Fetal Monitoring:  120-130's, + accels, no ut ctx noted  Labs:  No results found for this or any previous visit (from the past 24 hour(s)).  Imaging Studies:    NA   Medications:  Scheduled . amoxicillin  500 mg Oral Q8H  . docusate sodium  100 mg Oral Daily  . erythromycin  250 mg Oral Q6H  . mirtazapine  15 mg Oral QHS  . pantoprazole  40 mg Oral Daily  . prenatal multivitamin  1 tablet Oral Q1200   I have reviewed the patient's current medications.  ASSESSMENT: IUP 32 3/7 weeks PROM Wt loss N/V  PLAN: S/P magnesium and BMZ. Continue with latency antibiotics. Continue with antiemetics as needed. Nutritional consult. No S/Sx of infection. Delivery for maternal or fetal indications prior to 34 weeks o/w IOL at 34 weeks Continue routine antenatal care.   Judy Lang 08/09/2018,10:33 AM

## 2018-08-10 NOTE — Progress Notes (Signed)
Patient ID: Judy Lang, female   DOB: 1986/09/25, 32 y.o.   MRN: [id number] [place] COMPREHENSIVE PROGRESS NOTE  Judy Lang is a 32 y.o. [id number] at [redacted]w[redacted]d  who is admitted for PROM.   Fetal presentation is cephalic. Length of Stay:  6  Days  Subjective: Pt is without complaints today Patient reports good fetal movement.  She reports no uterine contractions, no bleeding. Tolerating diet.  Vitals:  Blood pressure 110/72, pulse 78, temperature 98.4 F (36.9 C), resp. rate 18, height 5\' 2"  (1.575 m), weight 48.5 kg, SpO2 100 %.   Physical Examination: Lungs clear Heart RRR Abd soft + BS gravid non tender Ext non tender  Fetal Monitoring:  120-130's + acels  Labs:  No results found for this or any previous visit (from the past 24 hour(s)).  Imaging Studies:    NA   Medications:  Scheduled . amoxicillin  500 mg Oral Q8H  . docusate sodium  100 mg Oral Daily  . erythromycin  250 mg Oral Q6H  . feeding supplement (ENSURE ENLIVE)  237 mL Oral BID BM  . mirtazapine  15 mg Oral QHS  . pantoprazole  40 mg Oral Daily  . polyethylene glycol  17 g Oral Daily  . prenatal multivitamin  1 tablet Oral Q1200   I have reviewed the patient's current medications.  ASSESSMENT: IUP 32 4/7 PROM Wt loss, s/p nutational consult, tid Ensure N/V, imporved   PLAN: Stable. No S/Sx of infection. Continue with latency antibiotics. Delivery for maternal/fetal indications or at 34 weeks Continue routine antenatal care.   Chancy Milroy 08/10/2018,11:00 AM

## 2018-08-11 ENCOUNTER — Inpatient Hospital Stay (HOSPITAL_COMMUNITY): Payer: Medicaid Other | Admitting: Anesthesiology

## 2018-08-11 ENCOUNTER — Encounter (HOSPITAL_COMMUNITY)
Admission: AD | Disposition: A | Discharge status: Home / Self Care | Payer: Self-pay | Source: Home / Self Care | Attending: Family Medicine

## 2018-08-11 DIAGNOSIS — Z3A32 32 weeks gestation of pregnancy: Secondary | ICD-10-CM

## 2018-08-11 DIAGNOSIS — O42113 Preterm premature rupture of membranes, onset of labor more than 24 hours following rupture, third trimester: Secondary | ICD-10-CM

## 2018-08-11 DIAGNOSIS — Z8759 Personal history of other complications of pregnancy, childbirth and the puerperium: Secondary | ICD-10-CM

## 2018-08-11 HISTORY — PX: DILATION AND EVACUATION: SHX1459

## 2018-08-11 LAB — PREPARE RBC (CROSSMATCH)

## 2018-08-11 LAB — CBC
HCT: 33.2 % — ABNORMAL LOW (ref 36.0–46.0)
Hemoglobin: 11.3 g/dL — ABNORMAL LOW (ref 12.0–15.0)
MCH: 31.7 pg (ref 26.0–34.0)
MCHC: 34 g/dL (ref 30.0–36.0)
MCV: 93.3 fL (ref 80.0–100.0)
NRBC: 0 % (ref 0.0–0.2)
PLATELETS: 194 10*3/uL (ref 150–400)
RBC: 3.56 MIL/uL — AB (ref 3.87–5.11)
RDW: 12.8 % (ref 11.5–15.5)
WBC: 17.4 10*3/uL — ABNORMAL HIGH (ref 4.0–10.5)

## 2018-08-11 LAB — BPAM RBC
Blood Product Expiration Date: 201911142359
Blood Product Expiration Date: 201911142359
Unit Type and Rh: 5100
Unit Type and Rh: 5100

## 2018-08-11 LAB — TYPE AND SCREEN
ABO/RH(D): O POS
ANTIBODY SCREEN: NEGATIVE
Unit division: 0
Unit division: 0

## 2018-08-11 SURGERY — DILATION AND EVACUATION, UTERUS
Anesthesia: General

## 2018-08-11 MED ORDER — PROPOFOL 10 MG/ML IV BOLUS
INTRAVENOUS | Status: AC
  Filled 2018-08-11: qty 20

## 2018-08-11 MED ORDER — PHENYLEPHRINE HCL 10 MG/ML IJ SOLN
INTRAMUSCULAR | Status: DC | PRN
  Administered 2018-08-11: 80 ug via INTRAVENOUS

## 2018-08-11 MED ORDER — DIPHENHYDRAMINE HCL 25 MG PO CAPS: 25.0000 mg | ORAL_CAPSULE | Freq: Four times a day (QID) | ORAL | Status: DC | PRN

## 2018-08-11 MED ORDER — COCONUT OIL OIL: 1.0000 "application " | TOPICAL_OIL | Status: DC | PRN

## 2018-08-11 MED ORDER — PROPOFOL 10 MG/ML IV BOLUS
INTRAVENOUS | Status: DC | PRN
  Administered 2018-08-11: 160 mg via INTRAVENOUS

## 2018-08-11 MED ORDER — PRENATAL MULTIVITAMIN CH
1.0000 | ORAL_TABLET | Freq: Every day | ORAL | Status: DC
  Administered 2018-08-12 – 2018-08-13 (×2): 1 via ORAL
  Filled 2018-08-11 (×2): qty 1

## 2018-08-11 MED ORDER — DEXAMETHASONE SODIUM PHOSPHATE 4 MG/ML IJ SOLN
INTRAMUSCULAR | Status: AC
  Filled 2018-08-11: qty 1

## 2018-08-11 MED ORDER — FENTANYL CITRATE (PF) 100 MCG/2ML IJ SOLN
INTRAMUSCULAR | Status: DC | PRN
  Administered 2018-08-11 (×3): 50 ug via INTRAVENOUS

## 2018-08-11 MED ORDER — LACTATED RINGERS IV SOLN: INTRAVENOUS | Status: DC

## 2018-08-11 MED ORDER — LACTATED RINGERS IV SOLN
INTRAVENOUS | Status: DC | PRN
  Administered 2018-08-11: 20:00:00 via INTRAVENOUS

## 2018-08-11 MED ORDER — OXYCODONE HCL 5 MG/5ML PO SOLN: 5.0000 mg | Freq: Once | ORAL | Status: DC | PRN

## 2018-08-11 MED ORDER — WITCH HAZEL-GLYCERIN EX PADS: 1.0000 "application " | MEDICATED_PAD | CUTANEOUS | Status: DC | PRN

## 2018-08-11 MED ORDER — HYDROMORPHONE HCL 1 MG/ML IJ SOLN: 0.2500 mg | INTRAMUSCULAR | Status: DC | PRN

## 2018-08-11 MED ORDER — SOD CITRATE-CITRIC ACID 500-334 MG/5ML PO SOLN
ORAL | Status: AC
  Administered 2018-08-11: 30 mL
  Filled 2018-08-11: qty 15

## 2018-08-11 MED ORDER — SIMETHICONE 80 MG PO CHEW: 80.0000 mg | CHEWABLE_TABLET | ORAL | Status: DC | PRN

## 2018-08-11 MED ORDER — CEFAZOLIN SODIUM-DEXTROSE 2-4 GM/100ML-% IV SOLN: 2.0000 g | INTRAVENOUS | Status: DC

## 2018-08-11 MED ORDER — OXYTOCIN 10 UNIT/ML IJ SOLN
INTRAMUSCULAR | Status: AC
  Filled 2018-08-11: qty 4

## 2018-08-11 MED ORDER — MEASLES, MUMPS & RUBELLA VAC ~~LOC~~ INJ
0.5000 mL | INJECTION | Freq: Once | SUBCUTANEOUS | Status: DC
  Filled 2018-08-11: qty 0.5

## 2018-08-11 MED ORDER — ACETAMINOPHEN 325 MG PO TABS
650.0000 mg | ORAL_TABLET | ORAL | Status: DC | PRN
  Administered 2018-08-12 (×2): 650 mg via ORAL
  Filled 2018-08-11 (×3): qty 2

## 2018-08-11 MED ORDER — CEFAZOLIN SODIUM-DEXTROSE 2-3 GM-%(50ML) IV SOLR
INTRAVENOUS | Status: DC | PRN
  Administered 2018-08-11: 2 g via INTRAVENOUS

## 2018-08-11 MED ORDER — SODIUM CHLORIDE 0.9 % IJ SOLN
INTRAMUSCULAR | Status: AC
  Filled 2018-08-11: qty 20

## 2018-08-11 MED ORDER — FENTANYL CITRATE (PF) 250 MCG/5ML IJ SOLN
INTRAMUSCULAR | Status: AC
  Filled 2018-08-11: qty 5

## 2018-08-11 MED ORDER — LIDOCAINE HCL (CARDIAC) PF 100 MG/5ML IV SOSY
PREFILLED_SYRINGE | INTRAVENOUS | Status: DC | PRN
  Administered 2018-08-11: 80 mg via INTRAVENOUS

## 2018-08-11 MED ORDER — FENTANYL CITRATE (PF) 100 MCG/2ML IJ SOLN
INTRAMUSCULAR | Status: AC
  Filled 2018-08-11: qty 2

## 2018-08-11 MED ORDER — ONDANSETRON HCL 4 MG PO TABS: 4.0000 mg | ORAL_TABLET | ORAL | Status: DC | PRN

## 2018-08-11 MED ORDER — SUCCINYLCHOLINE CHLORIDE 20 MG/ML IJ SOLN
INTRAMUSCULAR | Status: DC | PRN
  Administered 2018-08-11: 100 mg via INTRAVENOUS

## 2018-08-11 MED ORDER — KETOROLAC TROMETHAMINE 30 MG/ML IJ SOLN
INTRAMUSCULAR | Status: AC
  Filled 2018-08-11: qty 1

## 2018-08-11 MED ORDER — PHENYLEPHRINE 40 MCG/ML (10ML) SYRINGE FOR IV PUSH (FOR BLOOD PRESSURE SUPPORT)
PREFILLED_SYRINGE | INTRAVENOUS | Status: AC
  Filled 2018-08-11: qty 10

## 2018-08-11 MED ORDER — MISOPROSTOL 200 MCG PO TABS
ORAL_TABLET | ORAL | Status: AC
  Filled 2018-08-11: qty 1

## 2018-08-11 MED ORDER — ZOLPIDEM TARTRATE 5 MG PO TABS
5.0000 mg | ORAL_TABLET | Freq: Every evening | ORAL | Status: DC | PRN
  Administered 2018-08-12 (×2): 5 mg via ORAL
  Filled 2018-08-11 (×2): qty 1

## 2018-08-11 MED ORDER — ONDANSETRON HCL 4 MG/2ML IJ SOLN: 4.0000 mg | INTRAMUSCULAR | Status: DC | PRN

## 2018-08-11 MED ORDER — IBUPROFEN 600 MG PO TABS
600.0000 mg | ORAL_TABLET | Freq: Four times a day (QID) | ORAL | Status: DC
  Administered 2018-08-12 – 2018-08-13 (×5): 600 mg via ORAL
  Filled 2018-08-11 (×6): qty 1

## 2018-08-11 MED ORDER — ONDANSETRON HCL 4 MG/2ML IJ SOLN
INTRAMUSCULAR | Status: AC
  Filled 2018-08-11: qty 2

## 2018-08-11 MED ORDER — BENZOCAINE-MENTHOL 20-0.5 % EX AERO: 1.0000 "application " | INHALATION_SPRAY | CUTANEOUS | Status: DC | PRN

## 2018-08-11 MED ORDER — TETANUS-DIPHTH-ACELL PERTUSSIS 5-2.5-18.5 LF-MCG/0.5 IM SUSP: 0.5000 mL | Freq: Once | INTRAMUSCULAR | Status: DC

## 2018-08-11 MED ORDER — MIDAZOLAM HCL 5 MG/5ML IJ SOLN
INTRAMUSCULAR | Status: DC | PRN
  Administered 2018-08-11: 2 mg via INTRAVENOUS

## 2018-08-11 MED ORDER — PROMETHAZINE HCL 25 MG/ML IJ SOLN: 6.2500 mg | INTRAMUSCULAR | Status: DC | PRN

## 2018-08-11 MED ORDER — MIDAZOLAM HCL 2 MG/2ML IJ SOLN
INTRAMUSCULAR | Status: AC
  Filled 2018-08-11: qty 2

## 2018-08-11 MED ORDER — LIDOCAINE HCL (CARDIAC) PF 100 MG/5ML IV SOSY
PREFILLED_SYRINGE | INTRAVENOUS | Status: AC
  Filled 2018-08-11: qty 5

## 2018-08-11 MED ORDER — MISOPROSTOL 200 MCG PO TABS
1000.0000 ug | ORAL_TABLET | Freq: Once | ORAL | Status: AC
  Administered 2018-08-11: 1000 ug via RECTAL

## 2018-08-11 MED ORDER — OXYTOCIN 40 UNITS IN LACTATED RINGERS INFUSION - SIMPLE MED
INTRAVENOUS | Status: AC
  Administered 2018-08-11: 19:00:00
  Filled 2018-08-11: qty 1000

## 2018-08-11 MED ORDER — KETOROLAC TROMETHAMINE 30 MG/ML IJ SOLN
INTRAMUSCULAR | Status: DC | PRN
  Administered 2018-08-11: 15 mg via INTRAVENOUS

## 2018-08-11 MED ORDER — ROCURONIUM BROMIDE 100 MG/10ML IV SOLN
INTRAVENOUS | Status: AC
  Filled 2018-08-11: qty 1

## 2018-08-11 MED ORDER — OXYTOCIN 10 UNIT/ML IJ SOLN
INTRAVENOUS | Status: DC | PRN
  Administered 2018-08-11: 40 [IU] via INTRAVENOUS

## 2018-08-11 MED ORDER — SENNOSIDES-DOCUSATE SODIUM 8.6-50 MG PO TABS
2.0000 | ORAL_TABLET | ORAL | Status: DC
  Administered 2018-08-12: 2 via ORAL
  Filled 2018-08-11: qty 2

## 2018-08-11 MED ORDER — DIBUCAINE 1 % RE OINT: 1.0000 "application " | TOPICAL_OINTMENT | RECTAL | Status: DC | PRN

## 2018-08-11 MED ORDER — FENTANYL CITRATE (PF) 100 MCG/2ML IJ SOLN
50.0000 ug | Freq: Once | INTRAMUSCULAR | Status: AC
  Administered 2018-08-11: 50 ug via INTRAVENOUS

## 2018-08-11 MED ORDER — SODIUM CHLORIDE 0.9 % IV SOLN
500.0000 mg | Freq: Once | INTRAVENOUS | Status: AC
  Administered 2018-08-11: 500 mg via INTRAVENOUS
  Filled 2018-08-11: qty 500

## 2018-08-11 MED ORDER — OXYCODONE HCL 5 MG PO TABS: 5.0000 mg | ORAL_TABLET | Freq: Once | ORAL | Status: DC | PRN

## 2018-08-11 MED ORDER — DEXAMETHASONE SODIUM PHOSPHATE 4 MG/ML IJ SOLN
INTRAMUSCULAR | Status: DC | PRN
  Administered 2018-08-11: 4 mg via INTRAVENOUS

## 2018-08-11 SURGICAL SUPPLY — 18 items
CATH ROBINSON RED A/P 16FR (CATHETERS) ×2 IMPLANT
CLOTH BEACON ORANGE TIMEOUT ST (SAFETY) ×2 IMPLANT
DECANTER SPIKE VIAL GLASS SM (MISCELLANEOUS) IMPLANT
GLOVE BIO SURGEON STRL SZ 6.5 (GLOVE) ×2 IMPLANT
GLOVE BIOGEL PI IND STRL 7.0 (GLOVE) ×2 IMPLANT
GLOVE BIOGEL PI INDICATOR 7.0 (GLOVE) ×2
GOWN STRL REUS W/TWL LRG LVL3 (GOWN DISPOSABLE) ×4 IMPLANT
KIT BERKELEY 1ST TRIMESTER 3/8 (MISCELLANEOUS) IMPLANT
NS IRRIG 1000ML POUR BTL (IV SOLUTION) ×2 IMPLANT
PACK VAGINAL MINOR WOMEN LF (CUSTOM PROCEDURE TRAY) ×2 IMPLANT
PAD OB MATERNITY 4.3X12.25 (PERSONAL CARE ITEMS) ×2 IMPLANT
PAD PREP 24X48 CUFFED NSTRL (MISCELLANEOUS) ×2 IMPLANT
SET BERKELEY SUCTION TUBING (SUCTIONS) IMPLANT
TOWEL OR 17X24 6PK STRL BLUE (TOWEL DISPOSABLE) ×4 IMPLANT
VACURETTE 10 RIGID CVD (CANNULA) IMPLANT
VACURETTE 7MM CVD STRL WRAP (CANNULA) IMPLANT
VACURETTE 8 RIGID CVD (CANNULA) IMPLANT
VACURETTE 9 RIGID CVD (CANNULA) IMPLANT

## 2018-08-11 NOTE — Op Note (Signed)
Judy Lang Likes PROCEDURE DATE: 08/11/2018  PREOPERATIVE DIAGNOSIS: retained placenta after vaginal delivery at [redacted]w[redacted]d POSTOPERATIVE DIAGNOSIS: The same PROCEDURE:     Manual extraction of retained placenta SURGEON:  Woodroe Mode, MD ASSISTANT: Gerri Lins DO INDICATIONS: 32 y.o. [phone] with retained placenta after vaginal delivery. She was posted for postpartum curettage. Risks of surgery were discussed with the patient including but not limited to: bleeding which may require transfusion; infection which may require antibiotics; injury to uterus or surrounding organs; need for additional procedures including laparotomy or laparoscopy; possibility of intrauterine scarring which may impair future fertility; and other postoperative/anesthesia complications. Written informed consent was obtained.    FINDINGS:  Retained placenta, specimen sent to pathology.  ANESTHESIA:    GET INTRAVENOUS FLUIDS:  1200 ml of LR ESTIMATED BLOOD LOSS:  Less than 200 ml. SPECIMENS:  Placenta sent to pathology COMPLICATIONS:  None immediate.  PROCEDURE DETAILS:  The patient received intravenous Cefazolin while in the preoperative area.  She was then taken to the operating room where monitored intravenous sedation was administered and was found to be adequate.  After an adequate timeout was performed, she was placed in the dorsal lithotomy position and examined; then prepped and draped in the sterile manner.   Her bladder was catheterized for an unmeasured amount of clear, yellow urine. Moderate amount of clots were in the vaginal and the cervix was open allowing manual exploration and placental extraction. Complete evacuation of the uterine cavity was assured with manual exploration.There was minimal bleeding noted The patient tolerated the procedure well and was taken to the recovery area awake, and in stable condition.   Woodroe Mode, MD Attending [place], [facility]

## 2018-08-11 NOTE — Anesthesia Preprocedure Evaluation (Signed)
Anesthesia Evaluation  Patient identified by MRN, date of birth, ID band Patient awake    Reviewed: Allergy & Precautions, NPO status , Patient's Chart, lab work & pertinent test results  Airway Mallampati: I  TM Distance: >3 FB Neck ROM: Full    Dental no notable dental hx.    Pulmonary neg pulmonary ROS,    Pulmonary exam normal breath sounds clear to auscultation       Cardiovascular negative cardio ROS Normal cardiovascular exam Rhythm:Regular Rate:Normal     Neuro/Psych negative neurological ROS  negative psych ROS   GI/Hepatic negative GI ROS, Neg liver ROS,   Endo/Other  negative endocrine ROS  Renal/GU negative Renal ROS     Musculoskeletal negative musculoskeletal ROS (+)   Abdominal   Peds  Hematology  (+) anemia ,   Anesthesia Other Findings retained placenta   Reproductive/Obstetrics                             Anesthesia Physical Anesthesia Plan  ASA: II and emergent  Anesthesia Plan: General   Post-op Pain Management:    Induction: Intravenous and Rapid sequence  PONV Risk Score and Plan: 3 and Dexamethasone, Ondansetron and Treatment may vary due to age or medical condition  Airway Management Planned:   Additional Equipment:   Intra-op Plan:   Post-operative Plan: Extubation in OR  Informed Consent: I have reviewed the patients History and Physical, chart, labs and discussed the procedure including the risks, benefits and alternatives for the proposed anesthesia with the patient or authorized representative who has indicated his/her understanding and acceptance.   Dental advisory given  Plan Discussed with: CRNA  Anesthesia Plan Comments:         Anesthesia Quick Evaluation

## 2018-08-11 NOTE — Anesthesia Procedure Notes (Signed)
Procedure Name: Intubation Date/Time: 08/11/2018 8:24 PM Performed by: Elenore Paddy, CRNA Pre-anesthesia Checklist: Patient identified, Emergency Drugs available, Suction available, Patient being monitored and Timeout performed Patient Re-evaluated:Patient Re-evaluated prior to induction Oxygen Delivery Method: Circle system utilized Preoxygenation: Pre-oxygenation with 100% oxygen Induction Type: IV induction, Cricoid Pressure applied and Rapid sequence Laryngoscope Size: Mac and 3 Grade View: Grade I Tube type: Oral Laser Tube: Cuffed inflated with minimal occlusive pressure - saline Tube size: 7.0 mm Number of attempts: 1 Airway Equipment and Method: Stylet

## 2018-08-11 NOTE — Progress Notes (Signed)
S/P SVD, see delivery note. Umbilical cord avulsed and placenta is retained with cx only 2 cm dilated. She had retained placenta last delivery and needed D&C. I offered D&C in the OR under anesthesia and she consents.  The risks of surgery were discussed in detail with the patient including but not limited to: bleeding which may require transfusion or reoperation; infection which may require prolonged hospitalization or re-hospitalization and antibiotic therapy; injury to bowel, bladder, ureters and major vessels or other surrounding organs; need for additional procedures including laparotomy; thromboembolic phenomenon, incisional problems and other postoperative or anesthesia complications.  Patient was told that the likelihood that her condition and symptoms will be treated effectively with this surgical management was very high; the postoperative expectations were also discussed in detail. The patient also understands the alternative treatment options which were discussed in full. All questions were answered.   Woodroe Mode, MD 08/11/2018 7:58 PM

## 2018-08-11 NOTE — Progress Notes (Signed)
Patient ID: Judy Lang, female   DOB: September 21, 1986, 32 y.o.   MRN: [id number] [place] COMPREHENSIVE PROGRESS NOTE  Judy Lang is a 32 y.o. [id number] at [redacted]w[redacted]d  who is admitted for PROM.   Fetal presentation is cephalic. Length of Stay:  7  Days  Subjective: Pt feeling some back pain and ut ctx this morning. + FM. No VB.   Vitals:  Blood pressure 127/81, pulse 80, temperature 98.1 F (36.7 C), temperature source Oral, resp. rate 18, height 5\' 2"  (1.575 m), weight 48.5 kg, SpO2 99 %.   Physical Examination: Lungs clear Heart RRR Abd soft + BS gravid non tender SVE 2/50/-3  Fetal Monitoring:  120-130's, + Accels, irrability  Labs:  No results found for this or any previous visit (from the past 24 hour(s)).  Imaging Studies:    NA   Medications:  Scheduled . docusate sodium  100 mg Oral Daily  . feeding supplement (ENSURE ENLIVE)  237 mL Oral BID BM  . mirtazapine  15 mg Oral QHS  . pantoprazole  40 mg Oral Daily  . polyethylene glycol  17 g Oral Daily  . prenatal multivitamin  1 tablet Oral Q1200   I have reviewed the patient's current medications.  ASSESSMENT: Patient Active Problem List   Diagnosis Date Noted  . Preterm premature rupture of membranes (PPROM) with unknown onset of labor 08/04/2018  . Supervision of normal pregnancy 03/24/2018  . History of postpartum hemorrhage, currently pregnant 03/02/2018  . Underweight 07/13/2017  . Pectus excavatum 07/13/2017  . Marijuana use 07/19/2013  . 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 Abnormal pap 02/02/2013  . Multiple fractures of pelvis 06/17/2012    PLAN: IUP 32 5/7 weeks PROM WT loss, TID Ensure N/V   Stable. Will give IV fluid bolus. No S/Sx of infection or labor presently. Continue with latency antibiotics. Delivery for maternal or fetal indications or at 34 weeks   Judy Lang 08/11/2018,10:00 AM

## 2018-08-11 NOTE — Transfer of Care (Signed)
Immediate Anesthesia Transfer of Care Note  Patient: Judy Lang  Procedure(s) Performed: DILATATION AND EVACUATION (N/A )  Patient Location: PACU  Anesthesia Type:General  Level of Consciousness: awake, alert  and oriented  Airway & Oxygen Therapy: Patient Spontanous Breathing and Patient connected to nasal cannula oxygen  Post-op Assessment: Report given to RN and Post -op Vital signs reviewed and stable  Post vital signs: Reviewed and stable HR 88, RR 18, SaO2 100%, BP 111/65  Last Vitals:  Vitals Value Taken Time  BP    Temp    Pulse    Resp    SpO2      Last Pain:  Vitals:   08/11/18 1749  TempSrc:   PainSc: 8       Patients Stated Pain Goal: 3 (19/80/22 1798)  Complications: No apparent anesthesia complications

## 2018-08-11 NOTE — Progress Notes (Signed)
Judy Lang is a 32 y.o. [phone] at [redacted]w[redacted]d by ultrasound admitted for Preterm labor, PROM  Subjective:   Objective: BP 110/67 (BP Location: Left Arm)   Pulse 77   Temp 98 F (36.7 C) (Oral)   Resp 18   Ht 5\' 2"  (1.575 m)   Wt 48.5 kg   LMP  (LMP Unknown)   SpO2 98%   BMI 19.57 kg/m  I/O last 3 completed shifts: In: 3239.1 [P.O.:360; I.V.:2879.1] Out: 5397 [Urine:3850] Total I/O In: 2313.5 [P.O.:980; I.V.:1333.5] Out: 1600 [Urine:1600]  FHT:  FHR: 130 bpm, variability: moderate,  accelerations:  Present,  decelerations:  Present variable UC:   regular, every 3 minutes SVE:   Dilation: 4 Effacement (%): 50 Station: -1 Exam by:: Roselie Awkward MD  Labs: Lab Results  Component Value Date   WBC 8.8 08/04/2018   HGB 11.1 (L) 08/04/2018   HCT 33.2 (L) 08/04/2018   MCV 93.3 08/04/2018   PLT 281 08/04/2018    Assessment / Plan: Preterm labor  Labor: preterm labor after prolonged ROM Preeclampsia:  no signs or symptoms of toxicity Fetal Wellbeing:  Category II Pain Control:  Labor support without medications I/D:  GBS negative Anticipated MOD:  NSVD To L&D form HR OB Emeterio Reeve 08/11/2018, 6:51 PM

## 2018-08-12 ENCOUNTER — Encounter (HOSPITAL_COMMUNITY): Payer: Self-pay | Admitting: Obstetrics & Gynecology

## 2018-08-12 NOTE — Progress Notes (Signed)
Post Partum Day 1  Subjective: Pt without complaints this morning. Ambulating, voiding, tolerating diet, and good oral pain. Bleeding min.  Infant doing well in NICU  Objective: Blood pressure 99/66, pulse 87, temperature 98.2 F (36.8 C), temperature source Oral, resp. rate 18, height 5\' 2"  (1.575 m), weight 48.5 kg, SpO2 100 %.  Physical Exam:  General: alert Lochia: appropriate Uterine Fundus: firm Incision: healing well DVT Evaluation: No evidence of DVT seen on physical exam.  Recent Labs    08/11/18 2012  HGB 11.3*  HCT 33.2*    Assessment/Plan: Plan for discharge tomorrow   LOS: 8 days   Chancy Milroy 08/12/2018, 10:00 AM

## 2018-08-12 NOTE — Lactation Note (Addendum)
This note was copied from a baby's chart. Lactation Consultation Note; Initial visit with this mom of NICU baby born at [place] 5 d. Mom has pumped once and is able to hand express Colostrum, Experienced BF mom for 4 months each with 2 previous babies. Assisted with pumping now. Was using #27 flanges- encouraged to use #24 flanges- better fit at present. Reviewed setup, use and cleaning of pump pieces. Encouraged to pump 8 times/24 hours. Has WIC in [place]. I will send referral to them for pump for home. Discussed loaner pump from [place] if she is unable to get one before the weekend. No questions at present, BF and NICU booklet given. To call prn  [name] [id number] Date: 08/12/2018 Reason for consult: Initial assessment;Preterm <34wks;NICU baby   Maternal Data Formula Feeding for Exclusion: No Has patient been taught Hand Expression?: Yes Does the patient have breastfeeding experience prior to this delivery?: Yes  Feeding    LATCH Score                   Interventions    Lactation Tools Discussed/Used WIC Program: Yes Pump Review: Setup, frequency, and cleaning Initiated by:: RN Date initiated:: 08/12/18   Consult Status Consult Status: Follow-up Date: 08/13/18 Follow-up type: In-patient    Truddie Crumble 08/12/2018, 9:02 AM

## 2018-08-12 NOTE — Anesthesia Postprocedure Evaluation (Signed)
Anesthesia Post Note  Patient: Judy Lang  Procedure(s) Performed: DILATATION AND EVACUATION (N/A )     Patient location during evaluation: PACU Anesthesia Type: General Level of consciousness: awake and alert Pain management: pain level controlled Vital Signs Assessment: post-procedure vital signs reviewed and stable Respiratory status: spontaneous breathing, nonlabored ventilation, respiratory function stable and patient connected to nasal cannula oxygen Cardiovascular status: blood pressure returned to baseline and stable Postop Assessment: no apparent nausea or vomiting Anesthetic complications: no    Last Vitals:  Vitals:   08/11/18 2216 08/11/18 2313  BP: 105/65 110/84  Pulse: 82 91  Resp: 20 20  Temp: 37.4 C 37.6 C  SpO2: 99% 99%    Last Pain:  Vitals:   08/12/18 0056  TempSrc:   PainSc: 4    Pain Goal: Patients Stated Pain Goal: 3 (08/11/18 0900)               Thurmond Butts P Tahesha Skeet

## 2018-08-13 MED ORDER — IBUPROFEN 600 MG PO TABS: 600.0000 mg | ORAL_TABLET | Freq: Four times a day (QID) | ORAL | 0 refills | Status: DC

## 2018-08-13 MED ORDER — OXYCODONE-ACETAMINOPHEN 5-325 MG PO TABS
1.0000 | ORAL_TABLET | Freq: Once | ORAL | Status: AC
  Administered 2018-08-13: 1 via ORAL
  Filled 2018-08-13: qty 1

## 2018-08-13 MED ORDER — OXYCODONE-ACETAMINOPHEN 5-325 MG PO TABS: 1.0000 | ORAL_TABLET | Freq: Four times a day (QID) | ORAL | 0 refills | Status: DC | PRN

## 2018-08-13 NOTE — Discharge Instructions (Signed)
Vaginal Delivery Vaginal delivery means that you will give birth by pushing your baby out of your birth canal (vagina). A team of health care providers will help you before, during, and after vaginal delivery. Birth experiences are unique for every woman and every pregnancy, and birth experiences vary depending on where you choose to give birth. What should I do to prepare for my baby's birth? Before your baby is born, it is important to talk with your health care provider about:  Your labor and delivery preferences. These may include: ? Medicines that you may be given. ? How you will manage your pain. This might include non-medical pain relief techniques or injectable pain relief such as epidural analgesia. ? How you and your baby will be monitored during labor and delivery. ? Who may be in the labor and delivery room with you. ? Your feelings about surgical delivery of your baby (cesarean delivery, or C-section) if this becomes necessary. ? Your feelings about receiving donated blood through an IV tube (blood transfusion) if this becomes necessary.  Whether you are able: ? To take pictures or videos of the birth. ? To eat during labor and delivery. ? To move around, walk, or change positions during labor and delivery.  What to expect after your baby is born, such as: ? Whether delayed umbilical cord clamping and cutting is offered. ? Who will care for your baby right after birth. ? Medicines or tests that may be recommended for your baby. ? Whether breastfeeding is supported in your hospital or birth center. ? How long you will be in the hospital or birth center.  How any medical conditions you have may affect your baby or your labor and delivery experience.  To prepare for your baby's birth, you should also:  Attend all of your health care visits before delivery (prenatal visits) as recommended by your health care provider. This is important.  Prepare your home for your baby's  arrival. Make sure that you have: ? Diapers. ? Baby clothing. ? Feeding equipment. ? Safe sleeping arrangements for you and your baby.  Install a car seat in your vehicle. Have your car seat checked by a certified car seat installer to make sure that it is installed safely.  Think about who will help you with your new baby at home for at least the first several weeks after delivery.  What can I expect when I arrive at the birth center or hospital? Once you are in labor and have been admitted into the hospital or birth center, your health care provider may:  Review your pregnancy history and any concerns you have.  Insert an IV tube into one of your veins. This is used to give you fluids and medicines.  Check your blood pressure, pulse, temperature, and heart rate (vital signs).  Check whether your bag of water (amniotic sac) has broken (ruptured).  Talk with you about your birth plan and discuss pain control options.  Monitoring Your health care provider may monitor your contractions (uterine monitoring) and your baby's heart rate (fetal monitoring). You may need to be monitored:  Often, but not continuously (intermittently).  All the time or for long periods at a time (continuously). Continuous monitoring may be needed if: ? You are taking certain medicines, such as medicine to relieve pain or make your contractions stronger. ? You have pregnancy or labor complications.  Monitoring may be done by:  Placing a special stethoscope or a handheld monitoring device on your abdomen to   check your baby's heartbeat, and feeling your abdomen for contractions. This method of monitoring does not continuously record your baby's heartbeat or your contractions.  Placing monitors on your abdomen (external monitors) to record your baby's heartbeat and the frequency and length of contractions. You may not have to wear external monitors all the time.  Placing monitors inside of your uterus  (internal monitors) to record your baby's heartbeat and the frequency, length, and strength of your contractions. ? Your health care provider may use internal monitors if he or she needs more information about the strength of your contractions or your baby's heart rate. ? Internal monitors are put in place by passing a thin, flexible wire through your vagina and into your uterus. Depending on the type of monitor, it may remain in your uterus or on your baby's head until birth. ? Your health care provider will discuss the benefits and risks of internal monitoring with you and will ask for your permission before inserting the monitors.  Telemetry. This is a type of continuous monitoring that can be done with external or internal monitors. Instead of having to stay in bed, you are able to move around during telemetry. Ask your health care provider if telemetry is an option for you.  Physical exam Your health care provider may perform a physical exam. This may include:  Checking whether your baby is positioned: ? With the head toward your vagina (head-down). This is most common. ? With the head toward the top of your uterus (head-up or breech). If your baby is in a breech position, your health care provider may try to turn your baby to a head-down position so you can deliver vaginally. If it does not seem that your baby can be born vaginally, your provider may recommend surgery to deliver your baby. In rare cases, you may be able to deliver vaginally if your baby is head-up (breech delivery). ? Lying sideways (transverse). Babies that are lying sideways cannot be delivered vaginally.  Checking your cervix to determine: ? Whether it is thinning out (effacing). ? Whether it is opening up (dilating). ? How low your baby has moved into your birth canal.  What are the three stages of labor and delivery?  Normal labor and delivery is divided into the following three stages: Stage 1  Stage 1 is the  longest stage of labor, and it can last for hours or days. Stage 1 includes: ? Early labor. This is when contractions may be irregular, or regular and mild. Generally, early labor contractions are more than 10 minutes apart. ? Active labor. This is when contractions get longer, more regular, more frequent, and more intense. ? The transition phase. This is when contractions happen very close together, are very intense, and may last longer than during any other part of labor.  Contractions generally feel mild, infrequent, and irregular at first. They get stronger, more frequent (about every 2-3 minutes), and more regular as you progress from early labor through active labor and transition.  Many women progress through stage 1 naturally, but you may need help to continue making progress. If this happens, your health care provider may talk with you about: ? Rupturing your amniotic sac if it has not ruptured yet. ? Giving you medicine to help make your contractions stronger and more frequent.  Stage 1 ends when your cervix is completely dilated to 4 inches (10 cm) and completely effaced. This happens at the end of the transition phase. Stage 2  Once   your cervix is completely effaced and dilated to 4 inches (10 cm), you may start to feel an urge to push. It is common for the body to naturally take a rest before feeling the urge to push, especially if you received an epidural or certain other pain medicines. This rest period may last for up to 1-2 hours, depending on your unique labor experience.  During stage 2, contractions are generally less painful, because pushing helps relieve contraction pain. Instead of contraction pain, you may feel stretching and burning pain, especially when the widest part of your baby's head passes through the vaginal opening (crowning).  Your health care provider will closely monitor your pushing progress and your baby's progress through the vagina during stage 2.  Your  health care provider may massage the area of skin between your vaginal opening and anus (perineum) or apply warm compresses to your perineum. This helps it stretch as the baby's head starts to crown, which can help prevent perineal tearing. ? In some cases, an incision may be made in your perineum (episiotomy) to allow the baby to pass through the vaginal opening. An episiotomy helps to make the opening of the vagina larger to allow more room for the baby to fit through.  It is very important to breathe and focus so your health care provider can control the delivery of your baby's head. Your health care provider may have you decrease the intensity of your pushing, to help prevent perineal tearing.  After delivery of your baby's head, the shoulders and the rest of the body generally deliver very quickly and without difficulty.  Once your baby is delivered, the umbilical cord may be cut right away, or this may be delayed for 1-2 minutes, depending on your baby's health. This may vary among health care providers, hospitals, and birth centers.  If you and your baby are healthy enough, your baby may be placed on your chest or abdomen to help maintain the baby's temperature and to help you bond with each other. Some mothers and babies start breastfeeding at this time. Your health care team will dry your baby and help keep your baby warm during this time.  Your baby may need immediate care if he or she: ? Showed signs of distress during labor. ? Has a medical condition. ? Was born too early (prematurely). ? Had a bowel movement before birth (meconium). ? Shows signs of difficulty transitioning from being inside the uterus to being outside of the uterus. If you are planning to breastfeed, your health care team will help you begin a feeding. Stage 3  The third stage of labor starts immediately after the birth of your baby and ends after you deliver the placenta. The placenta is an organ that develops  during pregnancy to provide oxygen and nutrients to your baby in the womb.  Delivering the placenta may require some pushing, and you may have mild contractions. Breastfeeding can stimulate contractions to help you deliver the placenta.  After the placenta is delivered, your uterus should tighten (contract) and become firm. This helps to stop bleeding in your uterus. To help your uterus contract and to control bleeding, your health care provider may: ? Give you medicine by injection, through an IV tube, by mouth, or through your rectum (rectally). ? Massage your abdomen or perform a vaginal exam to remove any blood clots that are left in your uterus. ? Empty your bladder by placing a thin, flexible tube (catheter) into your bladder. ? Encourage   you to breastfeed your baby. After labor is over, you and your baby will be monitored closely to ensure that you are both healthy until you are ready to go home. Your health care team will teach you how to care for yourself and your baby. This information is not intended to replace advice given to you by your health care provider. Make sure you discuss any questions you have with your health care provider. Document Released: 07/15/2008 Document Revised: 04/25/2016 Document Reviewed: 10/21/2015 Elsevier Interactive Patient Education  2018 [employer]  

## 2018-08-13 NOTE — Lactation Note (Signed)
This note was copied from a baby's chart. Lactation Consultation Note  Patient Name: Boy Bretta Fees GNFAO'Z Date: 08/13/2018 Reason for consult: Follow-up assessment;Preterm <34wks;NICU baby  Visited with mom of 72 hours old pre-term NICU female; mom is going home today. LC stopped by mom's room when RN was going over discharge instructions. Mom reported that she's pumping and already getting colostrum that she's taking to her NICU baby. She's a P2 and experienced BF. Mom didn't have any questions or concerns regarding lactation at this moment; but she's aware of [place] OP services and will contact if needed.  Maternal Data    Feeding Feeding Type: Donor Breast Milk  Interventions Interventions: Breast feeding basics reviewed  Lactation Tools Discussed/Used     Consult Status Consult Status: PRN Follow-up type: Call as needed    Maven Varelas Francene Boyers 08/13/2018, 12:37 PM

## 2018-08-13 NOTE — Progress Notes (Signed)
Pt discharged with printed instructions. Pt verbalized an understanding. No concerns noted. Darel Ricketts L Sekai Nayak, RN 

## 2018-08-13 NOTE — Discharge Summary (Signed)
Obstetric Discharge Summary Reason for Admission: PROM  at 31 5/7 weeks  Prenatal Procedures: Antibiotics, celestone, U/S and NST Intrapartum Procedures: spontaneous vaginal delivery Postpartum Procedures: antibiotics and D & C Complications-Operative and Postpartum: retained placenta Hemoglobin  Date Value Ref Range Status  08/11/2018 11.3 (L) 12.0 - 15.0 g/dL Final  07/01/2018 11.9 11.1 - 15.9 g/dL Final   HCT  Date Value Ref Range Status  08/11/2018 33.2 (L) 36.0 - 46.0 % Final   Hematocrit  Date Value Ref Range Status  07/01/2018 36.3 34.0 - 46.6 % Final    Hospital Course:  Pt was admitted with Dx of PROM at 31 5/7 weeks. Received antibiotics as per protocol. Celestone for FLM. Fetal well being was monitored during hospitalization and remained reassuring. She did well until the evening of 08/11/18 at which time she went into labor. Had a SVD without problems. See Delivery note for additional information. She did have retained placenta which required her to be taken to the OR for removal. See OP note for additional information.  Pt's PP course was unremarkable. She remained afebrile. Progressed to ambulating, voiding, tolerating diet and good oral pain control.  Infant remained stable in NICU  Felt pt amendable for discharge home on PPD # 2. Discharge instructions, medications and follow up reviewed with pt. Pt verbalized understanding.   Physical Exam:  General: alert Lochia: appropriate Uterine Fundus: firm Incision: healing well DVT Evaluation: No evidence of DVT seen on physical exam.  Discharge Diagnoses: SVD   Discharge Information: Date: 08/13/2018 Activity: pelvic rest Diet: routine Medications: PNV, Ibuprofen and Percocet Condition: stable Instructions: refer to practice specific booklet Discharge to: home Follow-up Information    [facility] Follow up.   Specialty:  Obstetrics and Gynecology Why:  Pt alreadt has PP visit on 09/15/18 Contact  information: [place] [phone]          Newborn Data: Live born female  Birth Weight: 3 lb 8.1 oz (1590 g) APGAR: 9, 9  Newborn Delivery   Birth date/time:  08/11/2018 19:16:00 Delivery type:  Vaginal, Spontaneous     Stable in NICU  Chancy Milroy 08/13/2018, 12:23 PM

## 2018-08-15 LAB — TYPE AND SCREEN
ABO/RH(D): O POS
Antibody Screen: NEGATIVE
UNIT DIVISION: 0
Unit division: 0

## 2018-08-15 LAB — BPAM RBC
Blood Product Expiration Date: 201911142359
Blood Product Expiration Date: 201911142359
UNIT TYPE AND RH: 5100
Unit Type and Rh: 5100

## 2018-09-14 ENCOUNTER — Encounter: Payer: Self-pay | Admitting: *Deleted

## 2018-09-15 ENCOUNTER — Ambulatory Visit: Payer: Medicaid Other | Admitting: Advanced Practice Midwife

## 2018-09-15 ENCOUNTER — Telehealth: Payer: Self-pay | Admitting: *Deleted

## 2018-09-15 NOTE — Telephone Encounter (Signed)
Patient called and stated that she is interested in getting nexplanon. She is going to come in next week. Advised that we would discuss it more next week and order it when she comes in. Patient had no other questions or concerns at this time.

## 2018-09-23 ENCOUNTER — Ambulatory Visit (INDEPENDENT_AMBULATORY_CARE_PROVIDER_SITE_OTHER): Payer: Medicaid Other | Admitting: Women's Health

## 2018-09-23 ENCOUNTER — Encounter: Payer: Self-pay | Admitting: Women's Health

## 2018-09-23 DIAGNOSIS — Z8751 Personal history of pre-term labor: Secondary | ICD-10-CM

## 2018-09-23 NOTE — Progress Notes (Signed)
POSTPARTUM VISIT [name] MRN [id number]  Date of birth: [birth year] Chief Complaint:   Postpartum Care  History of Present Illness:   Judy Lang is a 32 y.o. [phone] Hispanic female being seen today for a postpartum visit. She is 6 weeks postpartum following a spontaneous vaginal delivery at 32.5 gestational weeks d/t PPROM @ 31.5wks then PTL 1wk later while still in hospital. Anesthesia: none. Laceration: none. Avulsed cord, unable to manually extract placenta d/t clamped LUS, posted for D&C, however was able to manually remove placenta when in OR. I have fully reviewed the prenatal and intrapartum course. Pregnancy uncomplicated. Postpartum course has been uncomplicated. Bleeding no bleeding. Bowel function is normal. Bladder function is normal.  Patient is not sexually active. Last sexual activity: prior to birth of baby.  Contraception method is wants Nexplanon.  [place] Postpartum Depression Screening: negative. Score 0.   Last pap 07/13/17.  Results were normal .  No LMP recorded. (Menstrual status: Lactating).  Baby's course has been complicated by extra digit Lt hand, clipped the other day, 4wk NICU stay d/t prematurity. Baby is feeding by breast, small amt supplementation d/t prematurity.  Review of Systems:   Pertinent items are noted in HPI Denies Abnormal vaginal discharge w/ itching/odor/irritation, headaches, visual changes, shortness of breath, chest pain, abdominal pain, severe nausea/vomiting, or problems with urination or bowel movements. Pertinent History Reviewed:  Reviewed past medical,surgical, obstetrical and family history.  Reviewed problem list, medications and allergies. OB History  Gravida Para Term Preterm AB Living  4 2 2   1 2   SAB TAB Ectopic Multiple Live Births  1       2    # Outcome Date GA Lbr Len/2nd Weight Sex Delivery Anes PTL Lv  4 Gravida           3 Term 02/10/14 [redacted]w[redacted]d 03:25 6 lb 7 oz (2.92 kg) M Vag-Spont EPI   LIV     Birth Comments: None  2 Term 04/25/12 [redacted]w[redacted]d 04:40 / 00:05 6 lb 1.9 oz (2.775 kg) M Vag-Breech Local N LIV  1 SAB 11/01/03       N      Birth Comments: partial molar pregnancy   Physical Assessment:   Vitals:   09/23/18 1443  BP: 96/67  Pulse: 83  Weight: 101 lb (45.8 kg)  Height: 5\' 2"  (1.575 m)  Body mass index is 18.47 kg/m.       Physical Examination:   General appearance: alert, well appearing, and in no distress  Mental status: alert, oriented to person, place, and time  Skin: warm & dry   Cardiovascular: normal heart rate noted   Respiratory: normal respiratory effort, no distress   Breasts: deferred, no complaints   Abdomen: soft, non-tender   Pelvic: VULVA: normal appearing vulva with no masses, tenderness or lesions, UTERUS: uterus is normal size, shape, consistency and nontender  Rectal: no hemorrhoids  Extremities: no edema       No results found for this or any previous visit (from the past 24 hour(s)).  Assessment & Plan:  1) Postpartum exam 2) 6 wks s/p SVB @ 32.5wks d/t PPROM/labor 3) Breastfeeding 4) Depression screening 5) Contraception counseling, pt prefers abstinence until Nexplanon  Meds: No orders of the defined types were placed in this encounter.   Follow-up: Return in about 3 weeks (around 10/14/2018) for Nexplanon insertion, order today please.   No orders of the defined types were placed in this encounter.  [place], [facility] 09/23/2018 3:11 PM

## 2018-09-23 NOTE — Patient Instructions (Addendum)
NO SEX UNTIL AFTER YOU GET YOUR BIRTH CONTROL   Tips To Increase Milk Supply  Lots of water! Enough so that your urine is clear  Plenty of calories, if you're not getting enough calories, your milk supply can decrease  Breastfeed/pump often, every 2-3 hours x 20-17mins  Fenugreek 3 pills 3 times a day, this may make your urine smell like maple syrup  Mother's Milk Tea  Lactation cookies, google for the recipe  Real oatmeal

## 2018-10-14 ENCOUNTER — Encounter: Payer: Self-pay | Admitting: Women's Health

## 2018-10-14 ENCOUNTER — Ambulatory Visit (INDEPENDENT_AMBULATORY_CARE_PROVIDER_SITE_OTHER): Payer: Medicaid Other | Admitting: Women's Health

## 2018-10-14 VITALS — BP 91/63 | HR 77 | Ht 62.0 in | Wt 104.4 lb

## 2018-10-14 DIAGNOSIS — Z3049 Encounter for surveillance of other contraceptives: Secondary | ICD-10-CM | POA: Diagnosis not present

## 2018-10-14 DIAGNOSIS — Z3202 Encounter for pregnancy test, result negative: Secondary | ICD-10-CM

## 2018-10-14 DIAGNOSIS — Z30017 Encounter for initial prescription of implantable subdermal contraceptive: Secondary | ICD-10-CM

## 2018-10-14 LAB — POCT URINE PREGNANCY: PREG TEST UR: NEGATIVE

## 2018-10-14 MED ORDER — ETONOGESTREL 68 MG ~~LOC~~ IMPL
68.0000 mg | DRUG_IMPLANT | Freq: Once | SUBCUTANEOUS | Status: AC
  Administered 2018-10-14: 68 mg via SUBCUTANEOUS

## 2018-10-14 NOTE — Progress Notes (Signed)
   [place] INSERTION [name] MRN [id number]  Date of birth: [birth year] Subjective Findings:   Judy Lang is a 32 y.o. [phone] Hispanic female being seen today for insertion of a Nexplanon.   No LMP recorded. (Menstrual status: Lactating). Last sexual intercourse was prior to birth of baby Last pap 07/13/17. Results were:  normal  Risks/benefits/side effects of Nexplanon have been discussed and her questions have been answered.  Specifically, a failure rate of 10/998 has been reported, with an increased failure rate if pt takes [place] and/or antiseizure medicaitons.  She is aware of the common side effect of irregular bleeding, which the incidence of decreases over time. Signed copy of informed consent in chart.  Pertinent History Reviewed:   Reviewed past medical,surgical, social, obstetrical and family history.  Reviewed problem list, medications and allergies. Objective Findings & Procedure:    Vitals:   10/14/18 0853  Weight: 104 lb 6.4 oz (47.4 kg)  Height: 5\' 2"  (1.575 m)  Body mass index is 19.1 kg/m.  Results for orders placed or performed in visit on 10/14/18 (from the past 24 hour(s))  POCT urine pregnancy   Collection Time: 10/14/18  8:57 AM  Result Value Ref Range   Preg Test, Ur Negative Negative     Time out was performed.  She is ambidextrous, she wants to put the Nexplanon in her left arm, so her left arm, approximately 10cm from the medial epicondyle and 3-5cm posterior to the sulcus, was cleansed with alcohol and anesthetized with 2cc of 2% Lidocaine.  The area was cleansed again with betadine and the Nexplanon was inserted per manufacturer's recommendations without difficulty.  3 steri-strips and pressure bandage were applied. The patient tolerated the procedure well.  Assessment & Plan:   1) Nexplanon insertion Pt was instructed to keep the area clean and dry, remove pressure bandage in 24 hours, and keep insertion site  covered with the steri-strip for 3-5 days.  Back up contraception was recommended for 2 weeks.  She was given a card indicating date Nexplanon was inserted and date it needs to be removed. Follow-up PRN problems.  Orders Placed This Encounter  Procedures  . POCT urine pregnancy    Follow-up: Return in about 1 year (around 10/15/2019) for Physical.  [place], [facility] 10/14/2018 9:23 AM

## 2018-10-14 NOTE — Patient Instructions (Signed)
Keep the area clean and dry.  You can remove the big bandage in 24 hours, and the small steri-strip bandage in 3-5 days.  A back up method, such as condoms, should be used for two weeks. You may have irregular vaginal bleeding for the first 6 months after the Nexplanon is placed, then the bleeding usually lightens and it is possible that you may not have any periods.  If you have any concerns, please give [place] a call.    Etonogestrel implant What is this medicine? ETONOGESTREL (et oh noe JES trel) is a contraceptive (birth control) device. It is used to prevent pregnancy. It can be used for up to 3 years. This medicine may be used for other purposes; ask your health care provider or pharmacist if you have questions. COMMON BRAND NAME(S): Implanon, Nexplanon What should I tell my health care provider before I take this medicine? They need to know if you have any of these conditions: -abnormal vaginal bleeding -blood vessel disease or blood clots -breast, cervical, endometrial, ovarian, liver, or uterine cancer -diabetes -gallbladder disease -heart disease or recent heart attack -high blood pressure -high cholesterol or triglycerides -kidney disease -liver disease -migraine headaches -seizures -stroke -tobacco smoker -an unusual or allergic reaction to etonogestrel, anesthetics or antiseptics, other medicines, foods, dyes, or preservatives -pregnant or trying to get pregnant -breast-feeding How should I use this medicine? This device is inserted just under the skin on the inner side of your upper arm by a health care professional. Talk to your pediatrician regarding the use of this medicine in children. Special care may be needed. Overdosage: If you think you have taken too much of this medicine contact a poison control center or emergency room at once. NOTE: This medicine is only for you. Do not share this medicine with others. What if I miss a dose? This does not apply. What may  interact with this medicine? Do not take this medicine with any of the following medications: -amprenavir -fosamprenavir This medicine may also interact with the following medications: -acitretin -aprepitant -armodafinil -bexarotene -bosentan -carbamazepine -certain medicines for fungal infections like fluconazole, ketoconazole, itraconazole and voriconazole -certain medicines to treat hepatitis, HIV or AIDS -cyclosporine -felbamate -griseofulvin -lamotrigine -modafinil -oxcarbazepine -phenobarbital -phenytoin -primidone -rifabutin -rifampin -rifapentine -St. John's wort -topiramate This list may not describe all possible interactions. Give your health care provider a list of all the medicines, herbs, non-prescription drugs, or dietary supplements you use. Also tell them if you smoke, drink alcohol, or use illegal drugs. Some items may interact with your medicine. What should I watch for while using this medicine? This product does not protect you against HIV infection (AIDS) or other sexually transmitted diseases. You should be able to feel the implant by pressing your fingertips over the skin where it was inserted. Contact your doctor if you cannot feel the implant, and use a non-hormonal birth control method (such as condoms) until your doctor confirms that the implant is in place. Contact your doctor if you think that the implant may have broken or become bent while in your arm. You will receive a user card from your health care provider after the implant is inserted. The card is a record of the location of the implant in your upper arm and when it should be removed. Keep this card with your health records. What side effects may I notice from receiving this medicine? Side effects that you should report to your doctor or health care professional as soon as possible: -allergic  reactions like skin rash, itching or hives, swelling of the face, lips, or tongue -breast lumps, breast  tissue changes, or discharge -breathing problems -changes in emotions or moods -if you feel that the implant may have broken or bent while in your arm -high blood pressure -pain, irritation, swelling, or bruising at the insertion site -scar at site of insertion -signs of infection at the insertion site such as fever, and skin redness, pain or discharge -signs and symptoms of a blood clot such as breathing problems; changes in vision; chest pain; severe, sudden headache; pain, swelling, warmth in the leg; trouble speaking; sudden numbness or weakness of the face, arm or leg -signs and symptoms of liver injury like dark yellow or brown urine; general ill feeling or flu-like symptoms; light-colored stools; loss of appetite; nausea; right upper belly pain; unusually weak or tired; yellowing of the eyes or skin -unusual vaginal bleeding, discharge Side effects that usually do not require medical attention (report to your doctor or health care professional if they continue or are bothersome): -acne -breast pain or tenderness -headache -irregular menstrual bleeding -nausea This list may not describe all possible side effects. Call your doctor for medical advice about side effects. You may report side effects to FDA at 1-800-FDA-1088. Where should I keep my medicine? This drug is given in a hospital or clinic and will not be stored at home. NOTE: This sheet is a summary. It may not cover all possible information. If you have questions about this medicine, talk to your doctor, pharmacist, or health care provider.  2019 Elsevier/Gold Standard (2017-08-25 14:11:42)

## 2018-10-14 NOTE — Addendum Note (Signed)
Addended by: Diona Fanti A on: 10/14/2018 09:57 AM   Modules accepted: Orders

## 2018-12-10 DIAGNOSIS — H5213 Myopia, bilateral: Secondary | ICD-10-CM | POA: Diagnosis not present

## 2018-12-13 ENCOUNTER — Telehealth: Payer: Self-pay | Admitting: *Deleted

## 2018-12-13 NOTE — Telephone Encounter (Signed)
LMOVM returning pts call.  

## 2018-12-14 ENCOUNTER — Telehealth: Payer: Self-pay | Admitting: Obstetrics & Gynecology

## 2018-12-14 NOTE — Telephone Encounter (Signed)
Pt is asking what she can take to increase her milk supply. She states that she has tried fenugreek and tea as well as lactation cookies. She says she is pumping every 3 hours. She noticed a decrease in her milk supply after having nexplanon placed. Advised that she could try taking fenugreek tablets or call lactation dept at the hospital to see if they have any other recommendations. Pt verbalized understanding.

## 2018-12-14 NOTE — Telephone Encounter (Signed)
Patient called back returning your call.  [phone]

## 2018-12-28 DIAGNOSIS — F332 Major depressive disorder, recurrent severe without psychotic features: Secondary | ICD-10-CM | POA: Diagnosis not present

## 2019-01-04 ENCOUNTER — Encounter: Payer: Self-pay | Admitting: Women's Health

## 2019-01-04 ENCOUNTER — Other Ambulatory Visit: Payer: Self-pay

## 2019-01-04 ENCOUNTER — Ambulatory Visit (INDEPENDENT_AMBULATORY_CARE_PROVIDER_SITE_OTHER): Payer: Medicaid Other | Admitting: Women's Health

## 2019-01-04 VITALS — BP 91/66 | HR 69 | Ht 61.0 in | Wt 116.2 lb

## 2019-01-04 DIAGNOSIS — Z3046 Encounter for surveillance of implantable subdermal contraceptive: Secondary | ICD-10-CM

## 2019-01-04 MED ORDER — NORETHINDRONE 0.35 MG PO TABS: 1.0000 | ORAL_TABLET | Freq: Every day | ORAL | 11 refills | Status: DC

## 2019-01-04 NOTE — Progress Notes (Signed)
   [facility] REMOVAL [name] MRN [id number]  Date of birth: [birth year] Subjective Findings:   Judy Lang is a 33 y.o. [phone] Hispanic female being seen today for removal of a Nexplanon. Her Nexplanon was placed 10/14/18.  She desires removal because of irregular bleeding and mainly because of decreased milk supply that began right after insertion. Signed copy of informed consent in chart.   No LMP recorded. (Menstrual status: Lactating). Last pap10/23/19. Results were:  neg w/ -HRHPV The planned method of family planning is oral progesterone-only contraceptive Pertinent History Reviewed:   Reviewed past medical,surgical, social, obstetrical and family history.  Reviewed problem list, medications and allergies. Objective Findings & Procedure:    Vitals:   01/04/19 1013  BP: 91/66  Pulse: 69  Weight: 116 lb 3.2 oz (52.7 kg)  Height: 5\' 1"  (1.549 m)  Body mass index is 21.96 kg/m.  No results found for this or any previous visit (from the past 24 hour(s)).   Time out was performed.  Nexplanon site identified.  Area prepped in usual sterile fashon. One cc of 2% lidocaine was used to anesthetize the area at the distal end of the implant. A small stab incision was made right beside the implant on the distal portion.  The Nexplanon rod was grasped using hemostats and removed without difficulty.  There was less than 3 cc blood loss. There were no complications.  Steri-strips were applied over the small incision and a pressure bandage was applied.  The patient tolerated the procedure well. Assessment & Plan:   1) Nexplanon removal She was instructed to keep the area clean and dry, remove pressure bandage in 24 hours, and keep insertion site covered with the steri-strip for 3-5 days.   Follow-up PRN problems.  2) Contraception management> Rx micronor w/ 11RF, understands has to take at exact same time daily to be effective, if late taking use condom as back-up   No orders of the defined types were placed in this encounter.   Follow-up: Return for after 10/23 for physical.  LOVELLA HARDIE CNM, [facility] 01/04/2019 10:59 AM

## 2019-01-04 NOTE — Patient Instructions (Signed)
Keep the area clean and dry.  You can remove the big bandage in 24 hours, and the small steri-strip bandage in 3-5 days.   Tips To Increase Milk Supply  Lots of water! Enough so that your urine is clear  Plenty of calories, if you're not getting enough calories, your milk supply can decrease  Breastfeed/pump often, every 2-3 hours x 20-4mins  Fenugreek 3 pills 3 times a day, this may make your urine smell like maple syrup  Mother's Milk Tea  Lactation cookies, google for the recipe  Real oatmeal

## 2019-05-05 ENCOUNTER — Ambulatory Visit (INDEPENDENT_AMBULATORY_CARE_PROVIDER_SITE_OTHER): Payer: Medicaid Other | Admitting: Internal Medicine

## 2019-05-06 ENCOUNTER — Other Ambulatory Visit: Payer: Self-pay

## 2019-05-06 ENCOUNTER — Other Ambulatory Visit: Payer: Medicaid Other

## 2019-05-06 DIAGNOSIS — Z20822 Contact with and (suspected) exposure to covid-19: Secondary | ICD-10-CM

## 2019-05-06 DIAGNOSIS — R6889 Other general symptoms and signs: Secondary | ICD-10-CM | POA: Diagnosis not present

## 2019-05-10 LAB — NOVEL CORONAVIRUS, NAA: SARS-CoV-2, NAA: DETECTED — AB

## 2019-10-31 ENCOUNTER — Telehealth: Payer: Self-pay | Admitting: Adult Health

## 2019-10-31 NOTE — Telephone Encounter (Signed)
Called patient regarding appointment and the following message was left: ° ° °We have you scheduled for an upcoming appointment at our office. At this time, we are still not allowing visitors during the appointment, however, a support person, over age 32, may accompany you to your appointment if assistance is needed for safety or care concerns. Otherwise, support persons should remain outside until the visit is complete.  ° °We ask if you are sick, have any symptoms of COVID, have had any exposure to anyone suspected or confirmed of having COVID-19, or are awaiting test results for COVID-19, to call our office as we may need to reschedule you for a virtual visit or schedule your appointment for a later date.   ° °Please know we will ask you these questions or similar questions when you arrive for your appointment and understand this is how we are keeping everyone safe.   ° °Also,to keep you safe, please use the provided hand sanitizer when you enter the office. We are asking everyone in the office to wear a mask to help prevent the spread of °germs. If you have a mask of your own, please wear it to your appointment, if not, we are happy to provide one for you. ° °Thank you for understanding and your cooperation.  ° ° °[facility] Staff ° ° ° ° ° °

## 2019-11-01 ENCOUNTER — Other Ambulatory Visit: Payer: Self-pay

## 2019-11-01 ENCOUNTER — Encounter: Payer: Self-pay | Admitting: Adult Health

## 2019-11-01 ENCOUNTER — Ambulatory Visit (INDEPENDENT_AMBULATORY_CARE_PROVIDER_SITE_OTHER): Payer: Medicaid Other | Admitting: Adult Health

## 2019-11-01 VITALS — BP 89/58 | HR 76 | Ht 62.0 in | Wt 119.0 lb

## 2019-11-01 DIAGNOSIS — Z30013 Encounter for initial prescription of injectable contraceptive: Secondary | ICD-10-CM | POA: Insufficient documentation

## 2019-11-01 DIAGNOSIS — Z3202 Encounter for pregnancy test, result negative: Secondary | ICD-10-CM

## 2019-11-01 LAB — POCT URINE PREGNANCY: Preg Test, Ur: NEGATIVE

## 2019-11-01 MED ORDER — MEDROXYPROGESTERONE ACETATE 150 MG/ML IM SUSP: 150.0000 mg | INTRAMUSCULAR | 4 refills | Status: DC

## 2019-11-01 NOTE — Patient Instructions (Signed)
Medroxyprogesterone injection [Contraceptive] What is this medicine? MEDROXYPROGESTERONE (me DROX ee proe JES te rone) contraceptive injections prevent pregnancy. They provide effective birth control for 3 months. Depo-subQ Provera 104 is also used for treating pain related to endometriosis. This medicine may be used for other purposes; ask your health care provider or pharmacist if you have questions. COMMON BRAND NAME(S): Depo-Provera, Depo-subQ Provera 104 What should I tell my health care provider before I take this medicine? They need to know if you have any of these conditions:  frequently drink alcohol  asthma  blood vessel disease or a history of a blood clot in the lungs or legs  bone disease such as osteoporosis  breast cancer  diabetes  eating disorder (anorexia nervosa or bulimia)  high blood pressure  HIV infection or AIDS  kidney disease  liver disease  mental depression  migraine  seizures (convulsions)  stroke  tobacco smoker  vaginal bleeding  an unusual or allergic reaction to medroxyprogesterone, other hormones, medicines, foods, dyes, or preservatives  pregnant or trying to get pregnant  breast-feeding How should I use this medicine? Depo-Provera Contraceptive injection is given into a muscle. Depo-subQ Provera 104 injection is given under the skin. These injections are given by a health care professional. You must not be pregnant before getting an injection. The injection is usually given during the first 5 days after the start of a menstrual period or 6 weeks after delivery of a baby. Talk to your pediatrician regarding the use of this medicine in children. Special care may be needed. These injections have been used in female children who have started having menstrual periods. Overdosage: If you think you have taken too much of this medicine contact a poison control center or emergency room at once. NOTE: This medicine is only for you. Do not  share this medicine with others. What if I miss a dose? Try not to miss a dose. You must get an injection once every 3 months to maintain birth control. If you cannot keep an appointment, call and reschedule it. If you wait longer than 13 weeks between Depo-Provera contraceptive injections or longer than 14 weeks between Depo-subQ Provera 104 injections, you could get pregnant. Use another method for birth control if you miss your appointment. You may also need a pregnancy test before receiving another injection. What may interact with this medicine? Do not take this medicine with any of the following medications:  bosentan This medicine may also interact with the following medications:  aminoglutethimide  antibiotics or medicines for infections, especially rifampin, rifabutin, rifapentine, and griseofulvin  aprepitant  barbiturate medicines such as phenobarbital or primidone  bexarotene  carbamazepine  medicines for seizures like ethotoin, felbamate, oxcarbazepine, phenytoin, topiramate  modafinil  St. John's wort This list may not describe all possible interactions. Give your health care provider a list of all the medicines, herbs, non-prescription drugs, or dietary supplements you use. Also tell them if you smoke, drink alcohol, or use illegal drugs. Some items may interact with your medicine. What should I watch for while using this medicine? This drug does not protect you against HIV infection (AIDS) or other sexually transmitted diseases. Use of this product may cause you to lose calcium from your bones. Loss of calcium may cause weak bones (osteoporosis). Only use this product for more than 2 years if other forms of birth control are not right for you. The longer you use this product for birth control the more likely you will be at risk   for weak bones. Ask your health care professional how you can keep strong bones. You may have a change in bleeding pattern or irregular periods.  Many females stop having periods while taking this drug. If you have received your injections on time, your chance of being pregnant is very low. If you think you may be pregnant, see your health care professional as soon as possible. Tell your health care professional if you want to get pregnant within the next year. The effect of this medicine may last a long time after you get your last injection. What side effects may I notice from receiving this medicine? Side effects that you should report to your doctor or health care professional as soon as possible:  allergic reactions like skin rash, itching or hives, swelling of the face, lips, or tongue  breast tenderness or discharge  breathing problems  changes in vision  depression  feeling faint or lightheaded, falls  fever  pain in the abdomen, chest, groin, or leg  problems with balance, talking, walking  unusually weak or tired  yellowing of the eyes or skin Side effects that usually do not require medical attention (report to your doctor or health care professional if they continue or are bothersome):  acne  fluid retention and swelling  headache  irregular periods, spotting, or absent periods  temporary pain, itching, or skin reaction at site where injected  weight gain This list may not describe all possible side effects. Call your doctor for medical advice about side effects. You may report side effects to FDA at 1-800-FDA-1088. Where should I keep my medicine? This does not apply. The injection will be given to you by a health care professional. NOTE: This sheet is a summary. It may not cover all possible information. If you have questions about this medicine, talk to your doctor, pharmacist, or health care provider.  2020 Elsevier/Gold Standard (2008-10-27 18:37:56)  

## 2019-11-01 NOTE — Progress Notes (Signed)
  Subjective:     Patient ID: Judy Lang, female   DOB: 1986-10-15, 34 y.o.   MRN: [id number]  HPI Judy Lang is a 34 year old white female, single, [phone], in to discuss birth control has stopped breast feeding and wants to keep her weight on. PCP is [facility].  Review of Systems Has stopped breat feeding wants to get on birth control and keep her weight  Patient denies any headaches, hearing loss, fatigue, blurred vision, shortness of breath, chest pain, abdominal pain, problems with bowel movements, urination, or intercourse. No joint pain or mood swings.  Reviewed past medical,surgical, social and family history. Reviewed medications and allergies.     Objective:   Physical Exam BP (!) 89/58 (BP Location: Left Arm, Patient Position: Sitting, Cuff Size: Normal)   Pulse 76   Ht 5\' 2"  (1.575 m)   Wt 119 lb (54 kg)   LMP 10/10/2019 (Exact Date)   Breastfeeding No   BMI 21.77 kg/m   UPT is negative Skin warm and dry. Neck: mid line trachea, normal thyroid, good ROM, no lymphadenopathy noted. Lungs: clear to ausculation bilaterally. Cardiovascular: regular rate and rhythm. Fall risk is low PHQ 2 score is 0. Discussed depo and OCs and nexplanon and she wants to try depo.      Assessment:     1. Urine pregnancy test negative   2. Encounter for initial prescription of injectable contraceptive Will rx depo Meds ordered this encounter  Medications  . medroxyPROGESTERone (DEPO-PROVERA) 150 MG/ML injection    Sig: Inject 1 mL (150 mg total) into the muscle every 3 (three) months.    Dispense:  1 mL    Refill:  4    Order Specific Question:   Supervising Provider    Answer:   Florian Buff [[id number]]      Plan:     Call for appt with next period and bring depo to office for first injection   Review handout on depo provera.

## 2019-11-29 ENCOUNTER — Telehealth: Payer: Self-pay | Admitting: *Deleted

## 2019-11-29 NOTE — Telephone Encounter (Signed)
Pt left message that she has started her period and is ready to get her depo.

## 2019-11-29 NOTE — Telephone Encounter (Signed)
Patient states she was told to call when she started her cycle and states she started today. Will schedule for tomorrow and advised to bring Depo.

## 2019-11-30 ENCOUNTER — Other Ambulatory Visit: Payer: Self-pay

## 2019-11-30 ENCOUNTER — Encounter: Payer: Self-pay | Admitting: *Deleted

## 2019-11-30 ENCOUNTER — Ambulatory Visit (INDEPENDENT_AMBULATORY_CARE_PROVIDER_SITE_OTHER): Payer: Medicaid Other | Admitting: *Deleted

## 2019-11-30 DIAGNOSIS — Z3202 Encounter for pregnancy test, result negative: Secondary | ICD-10-CM | POA: Diagnosis not present

## 2019-11-30 DIAGNOSIS — Z3042 Encounter for surveillance of injectable contraceptive: Secondary | ICD-10-CM | POA: Diagnosis not present

## 2019-11-30 LAB — POCT URINE PREGNANCY: Preg Test, Ur: NEGATIVE

## 2019-11-30 MED ORDER — MEDROXYPROGESTERONE ACETATE 150 MG/ML IM SUSP
150.0000 mg | Freq: Once | INTRAMUSCULAR | Status: AC
  Administered 2019-11-30: 12:00:00 150 mg via INTRAMUSCULAR

## 2019-11-30 NOTE — Progress Notes (Signed)
   NURSE VISIT- INJECTION  SUBJECTIVE:  Judy Lang is a 34 y.o. [phone] female here for a Depo Provera for contraception/period management. She is a GYN patient.   OBJECTIVE:  LMP 10/10/2019 (Exact Date)   Appears well, in no apparent distress  Injection administered in: Left deltoid  Meds ordered this encounter  Medications  . medroxyPROGESTERone (DEPO-PROVERA) injection 150 mg    ASSESSMENT: GYN patient Depo Provera for contraception/period management PLAN: Follow-up: in 11-13 weeks for next Depo   Alice Rieger  11/30/2019 11:49 AM

## 2020-02-03 DIAGNOSIS — F419 Anxiety disorder, unspecified: Secondary | ICD-10-CM | POA: Diagnosis not present

## 2020-02-03 DIAGNOSIS — F4310 Post-traumatic stress disorder, unspecified: Secondary | ICD-10-CM | POA: Diagnosis not present

## 2020-02-09 ENCOUNTER — Encounter (HOSPITAL_COMMUNITY): Payer: Self-pay

## 2020-02-09 ENCOUNTER — Emergency Department (HOSPITAL_COMMUNITY)
Admission: EM | Admit: 2020-02-09 | Discharge: 2020-02-09 | Disposition: A | Discharge status: Home / Self Care | Payer: Medicaid Other | Attending: Emergency Medicine | Admitting: Emergency Medicine

## 2020-02-09 ENCOUNTER — Other Ambulatory Visit: Payer: Self-pay

## 2020-02-09 ENCOUNTER — Emergency Department (HOSPITAL_COMMUNITY): Payer: Medicaid Other

## 2020-02-09 DIAGNOSIS — R1012 Left upper quadrant pain: Secondary | ICD-10-CM | POA: Diagnosis not present

## 2020-02-09 DIAGNOSIS — R112 Nausea with vomiting, unspecified: Secondary | ICD-10-CM | POA: Insufficient documentation

## 2020-02-09 DIAGNOSIS — M549 Dorsalgia, unspecified: Secondary | ICD-10-CM | POA: Insufficient documentation

## 2020-02-09 DIAGNOSIS — R109 Unspecified abdominal pain: Secondary | ICD-10-CM

## 2020-02-09 DIAGNOSIS — R1110 Vomiting, unspecified: Secondary | ICD-10-CM | POA: Diagnosis not present

## 2020-02-09 DIAGNOSIS — R1013 Epigastric pain: Secondary | ICD-10-CM | POA: Diagnosis not present

## 2020-02-09 DIAGNOSIS — R1011 Right upper quadrant pain: Secondary | ICD-10-CM | POA: Diagnosis not present

## 2020-02-09 LAB — URINALYSIS, ROUTINE W REFLEX MICROSCOPIC
Bacteria, UA: NONE SEEN
Bilirubin Urine: NEGATIVE
Glucose, UA: NEGATIVE mg/dL
Hgb urine dipstick: NEGATIVE
Ketones, ur: 20 mg/dL — AB
Leukocytes,Ua: NEGATIVE
Nitrite: NEGATIVE
Protein, ur: 100 mg/dL — AB
Specific Gravity, Urine: 1.031 — ABNORMAL HIGH (ref 1.005–1.030)
pH: 5 (ref 5.0–8.0)

## 2020-02-09 LAB — CBC WITH DIFFERENTIAL/PLATELET
Abs Immature Granulocytes: 0.02 10*3/uL (ref 0.00–0.07)
Basophils Absolute: 0 10*3/uL (ref 0.0–0.1)
Basophils Relative: 0 %
Eosinophils Absolute: 0 10*3/uL (ref 0.0–0.5)
Eosinophils Relative: 0 %
HCT: 43.3 % (ref 36.0–46.0)
Hemoglobin: 14 g/dL (ref 12.0–15.0)
Immature Granulocytes: 0 %
Lymphocytes Relative: 14 %
Lymphs Abs: 1.2 10*3/uL (ref 0.7–4.0)
MCH: 31.2 pg (ref 26.0–34.0)
MCHC: 32.3 g/dL (ref 30.0–36.0)
MCV: 96.4 fL (ref 80.0–100.0)
Monocytes Absolute: 0.4 10*3/uL (ref 0.1–1.0)
Monocytes Relative: 5 %
Neutro Abs: 7.2 10*3/uL (ref 1.7–7.7)
Neutrophils Relative %: 81 %
Platelets: 276 10*3/uL (ref 150–400)
RBC: 4.49 MIL/uL (ref 3.87–5.11)
RDW: 13 % (ref 11.5–15.5)
WBC: 8.9 10*3/uL (ref 4.0–10.5)
nRBC: 0 % (ref 0.0–0.2)

## 2020-02-09 LAB — COMPREHENSIVE METABOLIC PANEL
ALT: 19 U/L (ref 0–44)
AST: 23 U/L (ref 15–41)
Albumin: 5.2 g/dL — ABNORMAL HIGH (ref 3.5–5.0)
Alkaline Phosphatase: 57 U/L (ref 38–126)
Anion gap: 11 (ref 5–15)
BUN: 14 mg/dL (ref 6–20)
CO2: 24 mmol/L (ref 22–32)
Calcium: 9.7 mg/dL (ref 8.9–10.3)
Chloride: 107 mmol/L (ref 98–111)
Creatinine, Ser: 0.63 mg/dL (ref 0.44–1.00)
GFR calc Af Amer: >60 mL/min (ref >60)
GFR calc non Af Amer: >60 mL/min (ref >60)
Glucose, Bld: 116 mg/dL — ABNORMAL HIGH (ref 70–99)
Potassium: 3.6 mmol/L (ref 3.5–5.1)
Sodium: 142 mmol/L (ref 135–145)
Total Bilirubin: 1.4 mg/dL — ABNORMAL HIGH (ref 0.3–1.2)
Total Protein: 8.7 g/dL — ABNORMAL HIGH (ref 6.5–8.1)

## 2020-02-09 LAB — LIPASE, BLOOD: Lipase: 22 U/L (ref 11–51)

## 2020-02-09 MED ORDER — METHOCARBAMOL 500 MG PO TABS: 500.0000 mg | ORAL_TABLET | Freq: Two times a day (BID) | ORAL | 0 refills | Status: DC

## 2020-02-09 MED ORDER — IOHEXOL 300 MG/ML  SOLN
80.0000 mL | Freq: Once | INTRAMUSCULAR | Status: AC | PRN
  Administered 2020-02-09: 80 mL via INTRAVENOUS

## 2020-02-09 MED ORDER — ONDANSETRON HCL 4 MG/2ML IJ SOLN
4.0000 mg | Freq: Once | INTRAMUSCULAR | Status: AC
  Administered 2020-02-09: 4 mg via INTRAVENOUS
  Filled 2020-02-09: qty 2

## 2020-02-09 MED ORDER — ONDANSETRON 4 MG PO TBDP: 4.0000 mg | ORAL_TABLET | Freq: Three times a day (TID) | ORAL | 0 refills | Status: DC | PRN

## 2020-02-09 MED ORDER — ALUM & MAG HYDROXIDE-SIMETH 200-200-20 MG/5ML PO SUSP
30.0000 mL | Freq: Once | ORAL | Status: AC
  Administered 2020-02-09: 30 mL via ORAL
  Filled 2020-02-09: qty 30

## 2020-02-09 MED ORDER — LIDOCAINE VISCOUS HCL 2 % MT SOLN
15.0000 mL | Freq: Once | OROMUCOSAL | Status: AC
  Administered 2020-02-09: 18:00:00 15 mL via ORAL
  Filled 2020-02-09: qty 15

## 2020-02-09 MED ORDER — SODIUM CHLORIDE 0.9 % IV BOLUS
1000.0000 mL | Freq: Once | INTRAVENOUS | Status: AC
  Administered 2020-02-09: 14:00:00 1000 mL via INTRAVENOUS

## 2020-02-09 MED ORDER — MORPHINE SULFATE (PF) 4 MG/ML IV SOLN
4.0000 mg | Freq: Once | INTRAVENOUS | Status: AC
  Administered 2020-02-09: 14:00:00 4 mg via INTRAVENOUS
  Filled 2020-02-09: qty 1

## 2020-02-09 MED ORDER — FAMOTIDINE 20 MG PO TABS: 20.0000 mg | ORAL_TABLET | Freq: Two times a day (BID) | ORAL | 0 refills | Status: DC

## 2020-02-09 NOTE — Discharge Instructions (Signed)
Take the Pepcid to coat your stomach.  I have also given you Zofran for nausea and vomiting.  Use the Robaxin for the muscle pain in your back.  Would stop using any Aleve or anti-inflammatories as this can cause ulcers in your stomach.

## 2020-02-09 NOTE — ED Triage Notes (Signed)
Pt c/o of stomach and back pain x1 month. States pain is becoming more severe over last 2 days. Pt states "The pain is in my whole back and whole stomach. I'm taking Setraline for anxiety which causes my stomach to hurt.I have been taking Setraline since Friday."  Pt states "I've always had stomach pain and come here at least once a year for dehydration. But now my back hurts too"  Denies any other sx.

## 2020-02-09 NOTE — ED Notes (Signed)
Pt given Ginger-Ale for fluid challenge and tolerated it well with no difficulties.
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
## 2020-02-09 NOTE — ED Notes (Signed)
I-Stat beta hcg was done and result was negative. Results not crossing over into Epic at this time. EDP & CT tech aware of results.

## 2020-02-12 ENCOUNTER — Emergency Department (HOSPITAL_COMMUNITY)
Admission: EM | Admit: 2020-02-12 | Discharge: 2020-02-12 | Disposition: A | Discharge status: Home / Self Care | Payer: Medicaid Other | Attending: Emergency Medicine | Admitting: Emergency Medicine

## 2020-02-12 ENCOUNTER — Other Ambulatory Visit: Payer: Self-pay

## 2020-02-12 ENCOUNTER — Encounter (HOSPITAL_COMMUNITY): Payer: Self-pay

## 2020-02-12 DIAGNOSIS — R112 Nausea with vomiting, unspecified: Secondary | ICD-10-CM | POA: Diagnosis not present

## 2020-02-12 DIAGNOSIS — R1013 Epigastric pain: Secondary | ICD-10-CM | POA: Insufficient documentation

## 2020-02-12 DIAGNOSIS — Z91040 Latex allergy status: Secondary | ICD-10-CM | POA: Insufficient documentation

## 2020-02-12 DIAGNOSIS — Z79899 Other long term (current) drug therapy: Secondary | ICD-10-CM | POA: Diagnosis not present

## 2020-02-12 DIAGNOSIS — R109 Unspecified abdominal pain: Secondary | ICD-10-CM

## 2020-02-12 LAB — CBC WITH DIFFERENTIAL/PLATELET
Abs Immature Granulocytes: 0.03 10*3/uL (ref 0.00–0.07)
Basophils Absolute: 0 10*3/uL (ref 0.0–0.1)
Basophils Relative: 0 %
Eosinophils Absolute: 0 10*3/uL (ref 0.0–0.5)
Eosinophils Relative: 0 %
HCT: 39.5 % (ref 36.0–46.0)
Hemoglobin: 13.1 g/dL (ref 12.0–15.0)
Immature Granulocytes: 0 %
Lymphocytes Relative: 17 %
Lymphs Abs: 1.1 10*3/uL (ref 0.7–4.0)
MCH: 31.3 pg (ref 26.0–34.0)
MCHC: 33.2 g/dL (ref 30.0–36.0)
MCV: 94.3 fL (ref 80.0–100.0)
Monocytes Absolute: 0.4 10*3/uL (ref 0.1–1.0)
Monocytes Relative: 6 %
Neutro Abs: 5.1 10*3/uL (ref 1.7–7.7)
Neutrophils Relative %: 77 %
Platelets: 249 10*3/uL (ref 150–400)
RBC: 4.19 MIL/uL (ref 3.87–5.11)
RDW: 12.7 % (ref 11.5–15.5)
WBC: 6.7 10*3/uL (ref 4.0–10.5)
nRBC: 0 % (ref 0.0–0.2)

## 2020-02-12 LAB — URINALYSIS, ROUTINE W REFLEX MICROSCOPIC
Glucose, UA: NEGATIVE mg/dL
Ketones, ur: 80 mg/dL — AB
Nitrite: NEGATIVE
Protein, ur: 100 mg/dL — AB
Specific Gravity, Urine: 1.038 — ABNORMAL HIGH (ref 1.005–1.030)
pH: 5 (ref 5.0–8.0)

## 2020-02-12 LAB — RAPID URINE DRUG SCREEN, HOSP PERFORMED
Amphetamines: NOT DETECTED
Barbiturates: NOT DETECTED
Benzodiazepines: NOT DETECTED
Cocaine: NOT DETECTED
Opiates: POSITIVE — AB
Tetrahydrocannabinol: POSITIVE — AB

## 2020-02-12 LAB — COMPREHENSIVE METABOLIC PANEL
ALT: 18 U/L (ref 0–44)
AST: 19 U/L (ref 15–41)
Albumin: 4.9 g/dL (ref 3.5–5.0)
Alkaline Phosphatase: 51 U/L (ref 38–126)
Anion gap: 11 (ref 5–15)
BUN: 15 mg/dL (ref 6–20)
CO2: 24 mmol/L (ref 22–32)
Calcium: 9.4 mg/dL (ref 8.9–10.3)
Chloride: 102 mmol/L (ref 98–111)
Creatinine, Ser: 0.54 mg/dL (ref 0.44–1.00)
GFR calc Af Amer: >60 mL/min (ref >60)
GFR calc non Af Amer: >60 mL/min (ref >60)
Glucose, Bld: 124 mg/dL — ABNORMAL HIGH (ref 70–99)
Potassium: 3.3 mmol/L — ABNORMAL LOW (ref 3.5–5.1)
Sodium: 137 mmol/L (ref 135–145)
Total Bilirubin: 1.2 mg/dL (ref 0.3–1.2)
Total Protein: 8.1 g/dL (ref 6.5–8.1)

## 2020-02-12 LAB — LIPASE, BLOOD: Lipase: 27 U/L (ref 11–51)

## 2020-02-12 LAB — HCG, SERUM, QUALITATIVE: Preg, Serum: NEGATIVE

## 2020-02-12 MED ORDER — METOCLOPRAMIDE HCL 5 MG/ML IJ SOLN
10.0000 mg | Freq: Once | INTRAMUSCULAR | Status: AC
  Administered 2020-02-12: 10 mg via INTRAVENOUS
  Filled 2020-02-12: qty 2

## 2020-02-12 MED ORDER — POTASSIUM CHLORIDE CRYS ER 20 MEQ PO TBCR
40.0000 meq | EXTENDED_RELEASE_TABLET | Freq: Once | ORAL | Status: AC
  Administered 2020-02-12: 40 meq via ORAL
  Filled 2020-02-12: qty 2

## 2020-02-12 MED ORDER — SODIUM CHLORIDE 0.9 % IV BOLUS
1000.0000 mL | Freq: Once | INTRAVENOUS | Status: AC
  Administered 2020-02-12: 1000 mL via INTRAVENOUS

## 2020-02-12 MED ORDER — SUCRALFATE 1 GM/10ML PO SUSP: 1.0000 g | Freq: Three times a day (TID) | ORAL | 0 refills | Status: DC

## 2020-02-12 MED ORDER — FAMOTIDINE IN NACL 20-0.9 MG/50ML-% IV SOLN
20.0000 mg | Freq: Once | INTRAVENOUS | Status: AC
  Administered 2020-02-12: 20 mg via INTRAVENOUS
  Filled 2020-02-12: qty 50

## 2020-02-12 MED ORDER — PANTOPRAZOLE SODIUM 40 MG PO TBEC: 40.0000 mg | DELAYED_RELEASE_TABLET | Freq: Every day | ORAL | 0 refills | Status: DC

## 2020-02-12 MED ORDER — ALUM & MAG HYDROXIDE-SIMETH 200-200-20 MG/5ML PO SUSP
30.0000 mL | Freq: Once | ORAL | Status: AC
  Administered 2020-02-12: 30 mL via ORAL
  Filled 2020-02-12: qty 30

## 2020-02-12 MED ORDER — SUCRALFATE 1 GM/10ML PO SUSP
1.0000 g | Freq: Three times a day (TID) | ORAL | Status: DC
  Administered 2020-02-12 (×2): 1 g via ORAL
  Filled 2020-02-12 (×3): qty 10
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
## 2020-02-12 NOTE — ED Triage Notes (Addendum)
Pt reports diagnosed Thursday with an ulcer.  Reports still having pain, n/v.  Denies diarrhea.  LBM was yesterday.   Pt also reports had some vaginal bleeding today.

## 2020-02-12 NOTE — Discharge Instructions (Signed)
Take the protonix and carafate as directed. You can also continue the pepcid.   Please make sure to stay well hydrated.   Please follow up with your primary doctor or with your GI doctor within the next 5-7 days.   Please return to the ER sooner if you have any new or worsening symptoms, or if you have any of the following symptoms:  Abdominal pain that does not go away.  You have a fever.  You keep throwing up (vomiting).  The pain is felt only in portions of the abdomen. Pain in the right side could possibly be appendicitis. In an adult, pain in the left lower portion of the abdomen could be colitis or diverticulitis.  You pass bloody or black tarry stools.  There is bright red blood in the stool.  The constipation stays for more than 4 days.  There is belly (abdominal) or rectal pain.  You do not seem to be getting better.  You have any questions or concerns.

## 2020-02-13 LAB — URINE CULTURE

## 2020-02-15 ENCOUNTER — Encounter (HOSPITAL_COMMUNITY): Payer: Self-pay | Admitting: Emergency Medicine

## 2020-02-15 ENCOUNTER — Other Ambulatory Visit: Payer: Self-pay

## 2020-02-15 ENCOUNTER — Emergency Department (HOSPITAL_COMMUNITY)
Admission: EM | Admit: 2020-02-15 | Discharge: 2020-02-15 | Disposition: A | Discharge status: Home / Self Care | Payer: Medicaid Other | Attending: Emergency Medicine | Admitting: Emergency Medicine

## 2020-02-15 DIAGNOSIS — R1110 Vomiting, unspecified: Secondary | ICD-10-CM | POA: Insufficient documentation

## 2020-02-15 DIAGNOSIS — Z5321 Procedure and treatment not carried out due to patient leaving prior to being seen by health care provider: Secondary | ICD-10-CM | POA: Diagnosis not present

## 2020-02-15 DIAGNOSIS — R109 Unspecified abdominal pain: Secondary | ICD-10-CM | POA: Insufficient documentation

## 2020-02-15 LAB — I-STAT BETA HCG BLOOD, ED (MC, WL, AP ONLY): I-stat hCG, quantitative: <5 m[IU]/mL (ref <5)

## 2020-02-15 LAB — COMPREHENSIVE METABOLIC PANEL
ALT: 18 U/L (ref 0–44)
AST: 21 U/L (ref 15–41)
Albumin: 5.1 g/dL — ABNORMAL HIGH (ref 3.5–5.0)
Alkaline Phosphatase: 53 U/L (ref 38–126)
Anion gap: 26 — ABNORMAL HIGH (ref 5–15)
BUN: 21 mg/dL — ABNORMAL HIGH (ref 6–20)
CO2: 8 mmol/L — ABNORMAL LOW (ref 22–32)
Calcium: 9.7 mg/dL (ref 8.9–10.3)
Chloride: 106 mmol/L (ref 98–111)
Creatinine, Ser: 0.8 mg/dL (ref 0.44–1.00)
GFR calc Af Amer: >60 mL/min (ref >60)
GFR calc non Af Amer: >60 mL/min (ref >60)
Glucose, Bld: 121 mg/dL — ABNORMAL HIGH (ref 70–99)
Potassium: 3.2 mmol/L — ABNORMAL LOW (ref 3.5–5.1)
Sodium: 140 mmol/L (ref 135–145)
Total Bilirubin: 1.5 mg/dL — ABNORMAL HIGH (ref 0.3–1.2)
Total Protein: 7.9 g/dL (ref 6.5–8.1)

## 2020-02-15 LAB — URINALYSIS, ROUTINE W REFLEX MICROSCOPIC
Bacteria, UA: NONE SEEN
Glucose, UA: NEGATIVE mg/dL
Hgb urine dipstick: NEGATIVE
Ketones, ur: 80 mg/dL — AB
Leukocytes,Ua: NEGATIVE
Nitrite: NEGATIVE
Protein, ur: >=300 mg/dL — AB
Specific Gravity, Urine: 1.04 — ABNORMAL HIGH (ref 1.005–1.030)
pH: 5 (ref 5.0–8.0)

## 2020-02-15 LAB — CBC
HCT: 42.5 % (ref 36.0–46.0)
Hemoglobin: 14.3 g/dL (ref 12.0–15.0)
MCH: 31.5 pg (ref 26.0–34.0)
MCHC: 33.6 g/dL (ref 30.0–36.0)
MCV: 93.6 fL (ref 80.0–100.0)
Platelets: 304 10*3/uL (ref 150–400)
RBC: 4.54 MIL/uL (ref 3.87–5.11)
RDW: 12.8 % (ref 11.5–15.5)
WBC: 10.1 10*3/uL (ref 4.0–10.5)
nRBC: 0 % (ref 0.0–0.2)

## 2020-02-15 LAB — LIPASE, BLOOD: Lipase: 27 U/L (ref 11–51)

## 2020-02-15 MED ORDER — ONDANSETRON 4 MG PO TBDP
4.0000 mg | ORAL_TABLET | Freq: Once | ORAL | Status: AC | PRN
  Administered 2020-02-15: 4 mg via ORAL
  Filled 2020-02-15: qty 1

## 2020-02-15 MED ORDER — SODIUM CHLORIDE 0.9% FLUSH: 3.0000 mL | Freq: Once | INTRAVENOUS | Status: DC

## 2020-02-15 NOTE — ED Notes (Signed)
Pt called for vitals. No response.  

## 2020-02-15 NOTE — ED Triage Notes (Signed)
Pt to ED with c/o abd pain, nausea and vomiting for several weeks.  Pt st's pain is worse now.  Pt was seen at [facility] recently for same

## 2020-02-16 DIAGNOSIS — R112 Nausea with vomiting, unspecified: Secondary | ICD-10-CM | POA: Diagnosis not present

## 2020-02-16 DIAGNOSIS — K921 Melena: Secondary | ICD-10-CM | POA: Diagnosis not present

## 2020-02-16 DIAGNOSIS — R634 Abnormal weight loss: Secondary | ICD-10-CM | POA: Diagnosis not present

## 2020-02-16 DIAGNOSIS — Z681 Body mass index (BMI) 19 or less, adult: Secondary | ICD-10-CM | POA: Diagnosis not present

## 2020-02-16 DIAGNOSIS — R109 Unspecified abdominal pain: Secondary | ICD-10-CM | POA: Diagnosis not present

## 2020-02-21 ENCOUNTER — Telehealth: Payer: Self-pay | Admitting: Obstetrics & Gynecology

## 2020-02-21 NOTE — Telephone Encounter (Signed)

## 2020-02-22 ENCOUNTER — Ambulatory Visit: Payer: Medicaid Other

## 2020-03-02 DIAGNOSIS — F419 Anxiety disorder, unspecified: Secondary | ICD-10-CM | POA: Diagnosis not present

## 2020-03-02 DIAGNOSIS — F4310 Post-traumatic stress disorder, unspecified: Secondary | ICD-10-CM | POA: Diagnosis not present

## 2020-03-02 DIAGNOSIS — R109 Unspecified abdominal pain: Secondary | ICD-10-CM | POA: Diagnosis not present

## 2020-03-06 DIAGNOSIS — R109 Unspecified abdominal pain: Secondary | ICD-10-CM | POA: Diagnosis not present

## 2020-03-06 DIAGNOSIS — R1084 Generalized abdominal pain: Secondary | ICD-10-CM | POA: Diagnosis not present

## 2020-03-14 ENCOUNTER — Other Ambulatory Visit: Payer: Self-pay

## 2020-03-14 ENCOUNTER — Encounter (HOSPITAL_COMMUNITY): Payer: Self-pay | Admitting: Emergency Medicine

## 2020-03-14 ENCOUNTER — Emergency Department (HOSPITAL_COMMUNITY)
Admission: EM | Admit: 2020-03-14 | Discharge: 2020-03-14 | Disposition: A | Discharge status: Home / Self Care | Payer: Medicaid Other | Attending: Emergency Medicine | Admitting: Emergency Medicine

## 2020-03-14 DIAGNOSIS — M545 Low back pain, unspecified: Secondary | ICD-10-CM

## 2020-03-14 LAB — URINALYSIS, ROUTINE W REFLEX MICROSCOPIC
Bacteria, UA: NONE SEEN
Glucose, UA: NEGATIVE mg/dL
Hgb urine dipstick: NEGATIVE
Ketones, ur: 20 mg/dL — AB
Leukocytes,Ua: NEGATIVE
Nitrite: NEGATIVE
Protein, ur: 100 mg/dL — AB
Specific Gravity, Urine: 1.042 — ABNORMAL HIGH (ref 1.005–1.030)
pH: 5 (ref 5.0–8.0)

## 2020-03-14 LAB — PREGNANCY, URINE: Preg Test, Ur: NEGATIVE

## 2020-03-14 MED ORDER — IBUPROFEN 400 MG PO TABS
400.0000 mg | ORAL_TABLET | Freq: Once | ORAL | Status: AC
  Administered 2020-03-14: 400 mg via ORAL
  Filled 2020-03-14: qty 1

## 2020-03-14 MED ORDER — HYDROCODONE-ACETAMINOPHEN 5-325 MG PO TABS
1.0000 | ORAL_TABLET | Freq: Once | ORAL | Status: AC
  Administered 2020-03-14: 1 via ORAL
  Filled 2020-03-14: qty 1

## 2020-03-14 MED ORDER — MELOXICAM 7.5 MG PO TABS
7.5000 mg | ORAL_TABLET | Freq: Every day | ORAL | 0 refills | Status: DC
Start: 2020-03-14 — End: 2020-07-05

## 2020-03-14 NOTE — ED Triage Notes (Signed)
Pt reports abdominal cramping on Saturday but reports cramping has went away and now reports lower back pain. Pt reports "I went to poop and noticed bright red bleeding in the toilet." pt denies fever, nausea, gu symptoms.

## 2020-03-15 ENCOUNTER — Other Ambulatory Visit: Payer: Self-pay

## 2020-03-15 ENCOUNTER — Emergency Department (HOSPITAL_COMMUNITY)
Admission: EM | Admit: 2020-03-15 | Discharge: 2020-03-15 | Disposition: A | Discharge status: Home / Self Care | Payer: Medicaid Other | Attending: Emergency Medicine | Admitting: Emergency Medicine

## 2020-03-15 ENCOUNTER — Encounter (HOSPITAL_COMMUNITY): Payer: Self-pay | Admitting: Emergency Medicine

## 2020-03-15 DIAGNOSIS — R079 Chest pain, unspecified: Secondary | ICD-10-CM | POA: Diagnosis not present

## 2020-03-15 DIAGNOSIS — Z79899 Other long term (current) drug therapy: Secondary | ICD-10-CM | POA: Diagnosis not present

## 2020-03-15 DIAGNOSIS — R1010 Upper abdominal pain, unspecified: Secondary | ICD-10-CM | POA: Diagnosis not present

## 2020-03-15 DIAGNOSIS — M545 Low back pain, unspecified: Secondary | ICD-10-CM

## 2020-03-15 DIAGNOSIS — R112 Nausea with vomiting, unspecified: Secondary | ICD-10-CM

## 2020-03-15 LAB — CBC WITH DIFFERENTIAL/PLATELET
Abs Immature Granulocytes: 0.03 10*3/uL (ref 0.00–0.07)
Basophils Absolute: 0.1 10*3/uL (ref 0.0–0.1)
Basophils Relative: 1 %
Eosinophils Absolute: 0.2 10*3/uL (ref 0.0–0.5)
Eosinophils Relative: 2 %
HCT: 38.6 % (ref 36.0–46.0)
Hemoglobin: 12.5 g/dL (ref 12.0–15.0)
Immature Granulocytes: 0 %
Lymphocytes Relative: 16 %
Lymphs Abs: 1.3 10*3/uL (ref 0.7–4.0)
MCH: 31.7 pg (ref 26.0–34.0)
MCHC: 32.4 g/dL (ref 30.0–36.0)
MCV: 98 fL (ref 80.0–100.0)
Monocytes Absolute: 0.4 10*3/uL (ref 0.1–1.0)
Monocytes Relative: 6 %
Neutro Abs: 5.8 10*3/uL (ref 1.7–7.7)
Neutrophils Relative %: 75 %
Platelets: 247 10*3/uL (ref 150–400)
RBC: 3.94 MIL/uL (ref 3.87–5.11)
RDW: 13.3 % (ref 11.5–15.5)
WBC: 7.7 10*3/uL (ref 4.0–10.5)
nRBC: 0 % (ref 0.0–0.2)

## 2020-03-15 LAB — COMPREHENSIVE METABOLIC PANEL
ALT: 14 U/L (ref 0–44)
AST: 21 U/L (ref 15–41)
Albumin: 4.7 g/dL (ref 3.5–5.0)
Alkaline Phosphatase: 54 U/L (ref 38–126)
Anion gap: 14 (ref 5–15)
BUN: 15 mg/dL (ref 6–20)
CO2: 23 mmol/L (ref 22–32)
Calcium: 9.1 mg/dL (ref 8.9–10.3)
Chloride: 103 mmol/L (ref 98–111)
Creatinine, Ser: 0.55 mg/dL (ref 0.44–1.00)
GFR calc Af Amer: >60 mL/min (ref >60)
GFR calc non Af Amer: >60 mL/min (ref >60)
Glucose, Bld: 115 mg/dL — ABNORMAL HIGH (ref 70–99)
Potassium: 2.7 mmol/L — CL (ref 3.5–5.1)
Sodium: 140 mmol/L (ref 135–145)
Total Bilirubin: 1.3 mg/dL — ABNORMAL HIGH (ref 0.3–1.2)
Total Protein: 7.5 g/dL (ref 6.5–8.1)

## 2020-03-15 LAB — URINALYSIS, ROUTINE W REFLEX MICROSCOPIC
Bacteria, UA: NONE SEEN
Glucose, UA: NEGATIVE mg/dL
Hgb urine dipstick: NEGATIVE
Ketones, ur: 20 mg/dL — AB
Leukocytes,Ua: NEGATIVE
Nitrite: NEGATIVE
Protein, ur: 100 mg/dL — AB
Specific Gravity, Urine: 1.031 — ABNORMAL HIGH (ref 1.005–1.030)
pH: 5 (ref 5.0–8.0)

## 2020-03-15 LAB — LIPASE, BLOOD: Lipase: 21 U/L (ref 11–51)

## 2020-03-15 LAB — RAPID URINE DRUG SCREEN, HOSP PERFORMED
Amphetamines: NOT DETECTED
Barbiturates: NOT DETECTED
Benzodiazepines: NOT DETECTED
Cocaine: NOT DETECTED
Opiates: POSITIVE — AB
Tetrahydrocannabinol: POSITIVE — AB

## 2020-03-15 LAB — I-STAT BETA HCG BLOOD, ED (MC, WL, AP ONLY): I-stat hCG, quantitative: <5 m[IU]/mL (ref <5)

## 2020-03-15 LAB — TROPONIN I (HIGH SENSITIVITY)
Troponin I (High Sensitivity): <2 ng/L (ref <18)
Troponin I (High Sensitivity): <2 ng/L (ref <18)

## 2020-03-15 MED ORDER — METOCLOPRAMIDE HCL 5 MG/ML IJ SOLN
10.0000 mg | Freq: Once | INTRAMUSCULAR | Status: AC
  Administered 2020-03-15: 10 mg via INTRAVENOUS
  Filled 2020-03-15: qty 2

## 2020-03-15 MED ORDER — LIDOCAINE 5 % EX PTCH
2.0000 | MEDICATED_PATCH | CUTANEOUS | Status: DC
  Administered 2020-03-15: 2 via TRANSDERMAL
  Filled 2020-03-15: qty 2

## 2020-03-15 MED ORDER — METOCLOPRAMIDE HCL 10 MG PO TABS
10.0000 mg | ORAL_TABLET | Freq: Four times a day (QID) | ORAL | 0 refills | Status: DC | PRN
Start: 2020-03-15 — End: 2020-07-05

## 2020-03-15 MED ORDER — KETOROLAC TROMETHAMINE 30 MG/ML IJ SOLN
15.0000 mg | Freq: Once | INTRAMUSCULAR | Status: AC
  Administered 2020-03-15: 15 mg via INTRAVENOUS
  Filled 2020-03-15: qty 1

## 2020-03-15 MED ORDER — DIPHENHYDRAMINE HCL 50 MG/ML IJ SOLN
25.0000 mg | Freq: Once | INTRAMUSCULAR | Status: AC
  Administered 2020-03-15: 25 mg via INTRAVENOUS
  Filled 2020-03-15: qty 1

## 2020-03-15 MED ORDER — ONDANSETRON HCL 4 MG/2ML IJ SOLN
4.0000 mg | Freq: Once | INTRAMUSCULAR | Status: AC
  Administered 2020-03-15: 4 mg via INTRAVENOUS
  Filled 2020-03-15: qty 2

## 2020-03-15 MED ORDER — SODIUM CHLORIDE 0.9 % IV BOLUS
1000.0000 mL | Freq: Once | INTRAVENOUS | Status: AC
  Administered 2020-03-15: 1000 mL via INTRAVENOUS

## 2020-03-15 NOTE — ED Notes (Signed)
Pt asking for pain meds for back pain, edp notified.

## 2020-03-15 NOTE — ED Notes (Signed)
CRITICAL VALUE ALERT  Critical Value: Potassium 2.7 Date & Time Notied: 03/15/20 @ 0423 Provider Notified: Dr Betsey Holiday Orders Received/Actions taken:None yet

## 2020-03-15 NOTE — ED Triage Notes (Signed)
Pt seen here yesterday morning for back pain. Returns tonight for c/o back pain, abdominal pain, stomach pain, and chest pain as well as vomiting. States she vomited 3 times on way here.
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
## 2020-03-16 DIAGNOSIS — R109 Unspecified abdominal pain: Secondary | ICD-10-CM | POA: Diagnosis not present

## 2020-03-16 DIAGNOSIS — M545 Low back pain: Secondary | ICD-10-CM | POA: Diagnosis not present

## 2020-03-16 DIAGNOSIS — R112 Nausea with vomiting, unspecified: Secondary | ICD-10-CM | POA: Diagnosis not present
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
## 2020-04-05 DIAGNOSIS — Z4789 Encounter for other orthopedic aftercare: Secondary | ICD-10-CM | POA: Diagnosis not present

## 2020-04-05 DIAGNOSIS — M545 Low back pain: Secondary | ICD-10-CM | POA: Diagnosis not present

## 2020-04-05 DIAGNOSIS — S32811D Multiple fractures of pelvis with unstable disruption of pelvic ring, subsequent encounter for fracture with routine healing: Secondary | ICD-10-CM | POA: Diagnosis not present

## 2020-04-05 DIAGNOSIS — S82141D Displaced bicondylar fracture of right tibia, subsequent encounter for closed fracture with routine healing: Secondary | ICD-10-CM | POA: Diagnosis not present

## 2020-04-05 DIAGNOSIS — G8929 Other chronic pain: Secondary | ICD-10-CM | POA: Diagnosis not present

## 2020-05-11 DIAGNOSIS — M545 Low back pain: Secondary | ICD-10-CM | POA: Diagnosis not present

## 2020-05-11 DIAGNOSIS — R109 Unspecified abdominal pain: Secondary | ICD-10-CM | POA: Diagnosis not present

## 2020-05-11 DIAGNOSIS — F4310 Post-traumatic stress disorder, unspecified: Secondary | ICD-10-CM | POA: Diagnosis not present

## 2020-05-11 DIAGNOSIS — F419 Anxiety disorder, unspecified: Secondary | ICD-10-CM | POA: Diagnosis not present

## 2020-05-17 ENCOUNTER — Ambulatory Visit (INDEPENDENT_AMBULATORY_CARE_PROVIDER_SITE_OTHER): Payer: Medicaid Other | Admitting: Gastroenterology

## 2020-05-17 ENCOUNTER — Other Ambulatory Visit: Payer: Self-pay

## 2020-05-17 ENCOUNTER — Encounter (INDEPENDENT_AMBULATORY_CARE_PROVIDER_SITE_OTHER): Payer: Self-pay | Admitting: Gastroenterology

## 2020-05-17 VITALS — BP 103/69 | HR 82 | Temp 99.1°F | Ht 63.0 in | Wt 100.3 lb

## 2020-05-17 DIAGNOSIS — K625 Hemorrhage of anus and rectum: Secondary | ICD-10-CM | POA: Diagnosis not present

## 2020-05-17 DIAGNOSIS — E876 Hypokalemia: Secondary | ICD-10-CM | POA: Diagnosis not present

## 2020-05-17 DIAGNOSIS — R1084 Generalized abdominal pain: Secondary | ICD-10-CM | POA: Diagnosis not present

## 2020-05-17 DIAGNOSIS — R112 Nausea with vomiting, unspecified: Secondary | ICD-10-CM

## 2020-05-17 MED ORDER — HYOSCYAMINE SULFATE SL 0.125 MG SL SUBL
0.1250 mg | SUBLINGUAL_TABLET | Freq: Two times a day (BID) | SUBLINGUAL | 1 refills | Status: DC | PRN
Start: 2020-05-17 — End: 2020-09-05

## 2020-05-17 MED ORDER — LINACLOTIDE 72 MCG PO CAPS: 72.0000 ug | ORAL_CAPSULE | Freq: Every day | ORAL | 1 refills | Status: DC
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
## 2020-05-17 NOTE — Patient Instructions (Signed)
We are checking labs and call with results.

## 2020-05-18 LAB — BASIC METABOLIC PANEL
BUN: 9 mg/dL (ref 7–25)
CO2: 28 mmol/L (ref 20–32)
Calcium: 9.3 mg/dL (ref 8.6–10.2)
Chloride: 102 mmol/L (ref 98–110)
Creat: 0.68 mg/dL (ref 0.50–1.10)
Glucose, Bld: 91 mg/dL (ref 65–99)
Potassium: 4.2 mmol/L (ref 3.5–5.3)
Sodium: 137 mmol/L (ref 135–146)

## 2020-05-18 LAB — CELIAC DISEASE PANEL
(tTG) Ab, IgA: 1 U/mL
(tTG) Ab, IgG: 1 U/mL
Gliadin IgA: 1 Units
Gliadin IgG: 1 Units
Immunoglobulin A: 169 mg/dL (ref 47–310)

## 2020-05-18 LAB — HEPATIC FUNCTION PANEL
AG Ratio: 1.5 (calc) (ref 1.0–2.5)
ALT: 14 U/L (ref 6–29)
AST: 20 U/L (ref 10–30)
Albumin: 4.8 g/dL (ref 3.6–5.1)
Alkaline phosphatase (APISO): 78 U/L (ref 31–125)
Bilirubin, Direct: 0.1 mg/dL (ref 0.0–0.2)
Globulin: 3.1 g/dL (calc) (ref 1.9–3.7)
Indirect Bilirubin: 0.4 mg/dL (calc) (ref 0.2–1.2)
Total Bilirubin: 0.5 mg/dL (ref 0.2–1.2)
Total Protein: 7.9 g/dL (ref 6.1–8.1)

## 2020-05-21 ENCOUNTER — Other Ambulatory Visit (INDEPENDENT_AMBULATORY_CARE_PROVIDER_SITE_OTHER): Payer: Self-pay | Admitting: Gastroenterology

## 2020-05-21 ENCOUNTER — Telehealth (INDEPENDENT_AMBULATORY_CARE_PROVIDER_SITE_OTHER): Payer: Self-pay | Admitting: Gastroenterology

## 2020-05-21 NOTE — Telephone Encounter (Signed)
Patient left voice mail message wanting to talk to you about her medication - ph# [phone]

## 2020-05-22 NOTE — Telephone Encounter (Signed)
Tried to call patient x2

## 2020-05-22 NOTE — Telephone Encounter (Signed)
Judy Lang - can you call and check what questions she has about medications. I got a refill request electronically for her Mobic but since PCP prescribes this I declined it. Thanks.

## 2020-05-22 NOTE — Telephone Encounter (Signed)
Tried to call pt lvm for patient to call us back.

## 2020-05-23 NOTE — Telephone Encounter (Signed)
Called and lvm for patient call us back. 

## 2020-05-23 NOTE — Telephone Encounter (Signed)
Tried to call pt lvm

## 2020-05-24 ENCOUNTER — Encounter (INDEPENDENT_AMBULATORY_CARE_PROVIDER_SITE_OTHER): Payer: Self-pay | Admitting: *Deleted

## 2020-05-24 ENCOUNTER — Encounter (INDEPENDENT_AMBULATORY_CARE_PROVIDER_SITE_OTHER): Payer: Self-pay

## 2020-05-24 NOTE — Telephone Encounter (Signed)
She said that she had some vomiting on the other day no the flare up  , she seems to think it was related to what she ate the night before. Still not able to eat as much. She said the she using the muscle relaxer that was called in that is helping. She stated that pain has gotten better , she going to follow up in 4 weeks

## 2020-05-24 NOTE — Telephone Encounter (Signed)
Noted. Thank you for update!

## 2020-06-21 ENCOUNTER — Encounter (INDEPENDENT_AMBULATORY_CARE_PROVIDER_SITE_OTHER): Payer: Self-pay | Admitting: Gastroenterology

## 2020-06-21 ENCOUNTER — Other Ambulatory Visit: Payer: Self-pay

## 2020-06-21 ENCOUNTER — Ambulatory Visit (INDEPENDENT_AMBULATORY_CARE_PROVIDER_SITE_OTHER): Payer: Medicaid Other | Admitting: Gastroenterology

## 2020-06-21 VITALS — BP 113/79 | HR 88 | Temp 99.0°F | Ht 63.0 in | Wt 106.3 lb

## 2020-06-21 DIAGNOSIS — K219 Gastro-esophageal reflux disease without esophagitis: Secondary | ICD-10-CM | POA: Diagnosis not present

## 2020-06-21 DIAGNOSIS — R112 Nausea with vomiting, unspecified: Secondary | ICD-10-CM | POA: Diagnosis not present

## 2020-06-21 DIAGNOSIS — R1084 Generalized abdominal pain: Secondary | ICD-10-CM

## 2020-06-21 DIAGNOSIS — K5909 Other constipation: Secondary | ICD-10-CM

## 2020-06-21 MED ORDER — DEXILANT 60 MG PO CPDR
60.0000 mg | DELAYED_RELEASE_CAPSULE | Freq: Every day | ORAL | 3 refills | Status: DC
Start: 2020-06-21 — End: 2021-05-23
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
## 2020-06-21 NOTE — Patient Instructions (Signed)
Stop Protonix-this is not meant to take 4 times a day.  We are switching to Dexilant.  We are scheduling an abdominal ultrasound.  It is okay to take MiraLAX and Linzess together.  Please keep a log of when you have the vomiting and what you have tried to eat prior.
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
## 2020-06-26 ENCOUNTER — Telehealth (INDEPENDENT_AMBULATORY_CARE_PROVIDER_SITE_OTHER): Payer: Self-pay | Admitting: *Deleted

## 2020-06-26 NOTE — Telephone Encounter (Signed)
For Dexilant has been completed. Once we hear from the patient's insurance we will make her aware.

## 2020-07-02 ENCOUNTER — Ambulatory Visit (HOSPITAL_COMMUNITY): Payer: Medicaid Other

## 2020-07-02 ENCOUNTER — Other Ambulatory Visit: Payer: Self-pay

## 2020-07-02 ENCOUNTER — Ambulatory Visit (HOSPITAL_COMMUNITY)
Admission: RE | Admit: 2020-07-02 | Discharge: 2020-07-02 | Disposition: A | Discharge status: Home / Self Care | Payer: Medicaid Other | Source: Ambulatory Visit | Attending: Gastroenterology | Admitting: Gastroenterology

## 2020-07-02 ENCOUNTER — Other Ambulatory Visit (INDEPENDENT_AMBULATORY_CARE_PROVIDER_SITE_OTHER): Payer: Self-pay | Admitting: Gastroenterology

## 2020-07-02 DIAGNOSIS — R112 Nausea with vomiting, unspecified: Secondary | ICD-10-CM | POA: Diagnosis not present

## 2020-07-02 DIAGNOSIS — R109 Unspecified abdominal pain: Secondary | ICD-10-CM | POA: Diagnosis not present

## 2020-07-02 DIAGNOSIS — R1084 Generalized abdominal pain: Secondary | ICD-10-CM | POA: Insufficient documentation

## 2020-07-02 NOTE — Progress Notes (Signed)
I called patient with ultrasound results.  Still having symptoms but slightly better on Dexilant.  We will schedule endoscopy for evaluation.  I reviewed risk benefits alternatives and she consents to procedure.  Ann - okay to schedule EGD for n/v, GERD, epigastric pain

## 2020-07-03 ENCOUNTER — Other Ambulatory Visit (INDEPENDENT_AMBULATORY_CARE_PROVIDER_SITE_OTHER): Payer: Self-pay | Admitting: *Deleted

## 2020-07-04 ENCOUNTER — Encounter (INDEPENDENT_AMBULATORY_CARE_PROVIDER_SITE_OTHER): Payer: Self-pay | Admitting: *Deleted

## 2020-07-05 ENCOUNTER — Encounter: Payer: Self-pay | Admitting: Advanced Practice Midwife

## 2020-07-05 ENCOUNTER — Ambulatory Visit (INDEPENDENT_AMBULATORY_CARE_PROVIDER_SITE_OTHER): Payer: Medicaid Other | Admitting: Advanced Practice Midwife

## 2020-07-05 VITALS — BP 111/76 | HR 88 | Ht 63.0 in | Wt 104.5 lb

## 2020-07-05 DIAGNOSIS — Z3202 Encounter for pregnancy test, result negative: Secondary | ICD-10-CM

## 2020-07-05 DIAGNOSIS — R102 Pelvic and perineal pain: Secondary | ICD-10-CM | POA: Diagnosis not present

## 2020-07-05 LAB — POCT URINE PREGNANCY: Preg Test, Ur: NEGATIVE
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
## 2020-07-07 LAB — GC/CHLAMYDIA PROBE AMP
Chlamydia trachomatis, NAA: NEGATIVE
Neisseria Gonorrhoeae by PCR: NEGATIVE

## 2020-07-08 LAB — TRICHOMONAS VAGINALIS, PROBE AMP: Trich vag by NAA: NEGATIVE

## 2020-07-09 ENCOUNTER — Other Ambulatory Visit: Payer: Self-pay

## 2020-07-09 ENCOUNTER — Other Ambulatory Visit (HOSPITAL_COMMUNITY)
Admission: RE | Admit: 2020-07-09 | Discharge: 2020-07-09 | Disposition: A | Discharge status: Home / Self Care | Payer: Medicaid Other | Source: Ambulatory Visit | Attending: Gastroenterology | Admitting: Gastroenterology

## 2020-07-09 DIAGNOSIS — Z20822 Contact with and (suspected) exposure to covid-19: Secondary | ICD-10-CM | POA: Diagnosis not present

## 2020-07-09 DIAGNOSIS — Z01812 Encounter for preprocedural laboratory examination: Secondary | ICD-10-CM | POA: Insufficient documentation

## 2020-07-09 LAB — SARS CORONAVIRUS 2 (TAT 6-24 HRS): SARS Coronavirus 2: NEGATIVE

## 2020-07-10 ENCOUNTER — Ambulatory Visit (HOSPITAL_COMMUNITY): Payer: Medicaid Other | Admitting: Anesthesiology

## 2020-07-10 ENCOUNTER — Encounter (HOSPITAL_COMMUNITY)
Admission: RE | Disposition: A | Discharge status: Home / Self Care | Payer: Self-pay | Source: Home / Self Care | Attending: Gastroenterology

## 2020-07-10 ENCOUNTER — Other Ambulatory Visit: Payer: Self-pay

## 2020-07-10 ENCOUNTER — Encounter (HOSPITAL_COMMUNITY): Payer: Self-pay | Admitting: Gastroenterology

## 2020-07-10 ENCOUNTER — Ambulatory Visit (HOSPITAL_COMMUNITY)
Admission: RE | Admit: 2020-07-10 | Discharge: 2020-07-10 | Disposition: A | Discharge status: Home / Self Care | Payer: Medicaid Other | Attending: Gastroenterology | Admitting: Gastroenterology

## 2020-07-10 DIAGNOSIS — F1290 Cannabis use, unspecified, uncomplicated: Secondary | ICD-10-CM | POA: Diagnosis not present

## 2020-07-10 DIAGNOSIS — Z681 Body mass index (BMI) 19 or less, adult: Secondary | ICD-10-CM | POA: Diagnosis not present

## 2020-07-10 DIAGNOSIS — R636 Underweight: Secondary | ICD-10-CM | POA: Diagnosis not present

## 2020-07-10 DIAGNOSIS — Z91040 Latex allergy status: Secondary | ICD-10-CM | POA: Diagnosis not present

## 2020-07-10 DIAGNOSIS — K219 Gastro-esophageal reflux disease without esophagitis: Secondary | ICD-10-CM | POA: Diagnosis not present

## 2020-07-10 DIAGNOSIS — R1013 Epigastric pain: Secondary | ICD-10-CM | POA: Diagnosis not present

## 2020-07-10 DIAGNOSIS — Z79899 Other long term (current) drug therapy: Secondary | ICD-10-CM | POA: Diagnosis not present

## 2020-07-10 DIAGNOSIS — K5909 Other constipation: Secondary | ICD-10-CM | POA: Diagnosis not present

## 2020-07-10 DIAGNOSIS — R110 Nausea: Secondary | ICD-10-CM | POA: Diagnosis not present

## 2020-07-10 HISTORY — PX: BIOPSY: SHX5522

## 2020-07-10 HISTORY — PX: ESOPHAGOGASTRODUODENOSCOPY (EGD) WITH PROPOFOL: SHX5813

## 2020-07-10 LAB — PREGNANCY, URINE: Preg Test, Ur: NEGATIVE

## 2020-07-10 SURGERY — ESOPHAGOGASTRODUODENOSCOPY (EGD) WITH PROPOFOL
Anesthesia: General

## 2020-07-10 MED ORDER — GLYCOPYRROLATE 0.2 MG/ML IJ SOLN
INTRAMUSCULAR | Status: AC
  Administered 2020-07-10: 0.2 mg via INTRAVENOUS
  Filled 2020-07-10: qty 1

## 2020-07-10 MED ORDER — GLYCOPYRROLATE 0.2 MG/ML IJ SOLN: 0.2000 mg | Freq: Once | INTRAMUSCULAR | Status: AC

## 2020-07-10 MED ORDER — PROPOFOL 10 MG/ML IV BOLUS
INTRAVENOUS | Status: DC | PRN
  Administered 2020-07-10: 100 mg via INTRAVENOUS
  Administered 2020-07-10: 50 mg via INTRAVENOUS
  Administered 2020-07-10: 100 mg via INTRAVENOUS
  Administered 2020-07-10: 50 mg via INTRAVENOUS

## 2020-07-10 MED ORDER — LIDOCAINE VISCOUS HCL 2 % MT SOLN
15.0000 mL | Freq: Once | OROMUCOSAL | Status: AC
  Administered 2020-07-10: 15 mL via OROMUCOSAL

## 2020-07-10 MED ORDER — LIDOCAINE HCL (CARDIAC) PF 100 MG/5ML IV SOSY
PREFILLED_SYRINGE | INTRAVENOUS | Status: DC | PRN
  Administered 2020-07-10: 100 mg via INTRAVENOUS

## 2020-07-10 MED ORDER — LACTATED RINGERS IV SOLN: INTRAVENOUS | Status: DC | PRN

## 2020-07-10 MED ORDER — LIDOCAINE VISCOUS HCL 2 % MT SOLN
OROMUCOSAL | Status: AC
  Filled 2020-07-10: qty 15

## 2020-07-10 MED ORDER — LACTATED RINGERS IV SOLN: Freq: Once | INTRAVENOUS | Status: AC

## 2020-07-10 MED ORDER — PROPOFOL 500 MG/50ML IV EMUL
INTRAVENOUS | Status: DC | PRN
  Administered 2020-07-10: 100 ug/kg/min via INTRAVENOUS

## 2020-07-10 NOTE — Anesthesia Postprocedure Evaluation (Signed)
Anesthesia Post Note  Patient: Judy Lang  Procedure(s) Performed: ESOPHAGOGASTRODUODENOSCOPY (EGD) WITH PROPOFOL (N/A ) BIOPSY  Patient location during evaluation: Endoscopy Anesthesia Type: General Level of consciousness: awake Pain management: pain level controlled Vital Signs Assessment: post-procedure vital signs reviewed and stable Respiratory status: spontaneous breathing Cardiovascular status: blood pressure returned to baseline Postop Assessment: no headache and no apparent nausea or vomiting Anesthetic complications: no   No complications documented.   Last Vitals:  Vitals:   07/10/20 0807 07/10/20 1056  BP: 102/68 (!) 118/93  Pulse: 80 98  Resp: 15 (!) 100  Temp: 37.1 C 36.5 C  SpO2: 100% 100%    Last Pain:  Vitals:   07/10/20 1056  TempSrc: Oral  PainSc: 0-No pain                 Orlie Dakin

## 2020-07-10 NOTE — Discharge Instructions (Signed)
You are being discharged to home.  Resume your previous diet.  We are waiting for your pathology results.  Return to your GI clinic as previously scheduled.  Increase Linzess to daily intake (continue 72 mcg dosing).   Upper Endoscopy, Adult, Care After This sheet gives you information about how to care for yourself after your procedure. Your health care provider may also give you more specific instructions. If you have problems or questions, contact your health care provider. What can I expect after the procedure? After the procedure, it is common to have:  A sore throat.  Mild stomach pain or discomfort.  Bloating.  Nausea. Follow these instructions at home:  Do not drive for 24 hours if you were given a sedative during your procedure.  Keep all follow-up visits as told by your health care provider. This is important. Contact a health care provider if you have:  A sore throat that lasts longer than one day.  Trouble swallowing. Get help right away if:  You vomit blood or your vomit looks like coffee grounds.  You have: ? A fever. ? Bloody, black, or tarry stools. ? A severe sore throat or you cannot swallow. ? Difficulty breathing. ? Severe pain in your chest or abdomen. Summary  After the procedure, it is common to have a sore throat, mild stomach discomfort, bloating, and nausea.  Do not drive for 24 hours if you were given a sedative during the procedure.  Follow instructions from your health care provider about what to eat or drink after your procedure.  Return to your normal activities as told by your health care provider. This information is not intended to replace advice given to you by your health care provider. Make sure you discuss any questions you have with your health care provider. Document Revised: 03/30/2018 Document Reviewed: 03/08/2018 Elsevier Patient Education  [place].

## 2020-07-10 NOTE — Interval H&P Note (Signed)
History and Physical Interval Note:  07/10/2020 69:72 AM  34 year old female with past medical history of GERD and chronic abdominal pain, who comes to the hospital for evaluation of constipation and nausea.  The patient has had abdominal pain in her upper abdomen for the last 5 years, she reports that she has had also chronic constipation.  She has been recently taking Linzess 72 mcg every other day with improvement of her constipation, which she states helps to decrease her abdominal pain.  Denies having any n vomiting but has had some episodes of nausea intermittently.  Patient had abdominal ultrasound which was unremarkable.  Never had an EGD in the past.  No family history of gastrointestinal malignancies.  BP 102/68   Pulse 80   Temp 98.8 F (37.1 C) (Oral)   Resp 15   Ht 5\' 2"  (1.575 m)   LMP 06/12/2020   SpO2 100%   BMI 19.11 kg/m  GENERAL: The patient is AO x3, in no acute distress. HEENT: Head is normocephalic and atraumatic. EOMI are intact. Mouth is well hydrated and without lesions. NECK: Supple. No masses LUNGS: Clear to auscultation. No presence of rhonchi/wheezing/rales. Adequate chest expansion HEART: RRR, normal s1 and s2. ABDOMEN: Soft, nontender, no guarding, no peritoneal signs, and nondistended. BS +. No masses. EXTREMITIES: Without any cyanosis, clubbing, rash, lesions or edema. NEUROLOGIC: AOx3, no focal motor deficit. SKIN: no jaundice, no rashes   Judy Lang  has presented today for surgery, with the diagnosis of Nausea, vomiting, epigastric pain, GERD.  The various methods of treatment have been discussed with the patient and family. After consideration of risks, benefits and other options for treatment, the patient has consented to  Procedure(s) with comments: ESOPHAGOGASTRODUODENOSCOPY (EGD) WITH PROPOFOL (N/A) - 915 as a surgical intervention.  The patient's history has been reviewed, patient examined, no change in status, stable for surgery.  I  have reviewed the patient's chart and labs.  Questions were answered to the patient's satisfaction.     Judy Lang

## 2020-07-10 NOTE — Op Note (Signed)
[facility] [name] Procedure Date: 07/10/2020 10:34 AM MRN: [id number] Date of Birth: 06/29/1986 Attending MD: [name] ,  CSN: [id number] Age: 34 Admit Type: Outpatient Procedure:                Upper GI endoscopy Indications:              Epigastric abdominal pain, nausea Providers:                [name], Janeece Riggers, RN, Randa Spike,                            Technician Referring MD:              Medicines:                Monitored Anesthesia Care Complications:            No immediate complications. Estimated Blood Loss:     Estimated blood loss: none. Procedure:                Pre-Anesthesia Assessment:                           - Prior to the procedure, a History and Physical                            was performed, and patient medications, allergies                            and sensitivities were reviewed. The patient's                            tolerance of previous anesthesia was reviewed.                           - The risks and benefits of the procedure and the                            sedation options and risks were discussed with the                            patient. All questions were answered and informed                            consent was obtained.                           - ASA Grade Assessment: I - A normal, healthy                            patient.                           After obtaining informed consent, the endoscope was                            passed under direct vision. Throughout the  procedure, the patient's blood pressure, pulse, and                            oxygen saturations were monitored continuously.The                            upper GI endoscopy was accomplished without                            difficulty. The patient tolerated the procedure                            well. The [id number]) was introduced through                            the  mouth, and advanced to the second part of                            duodenum. Scope In: 10:45:13 AM Scope Out: 10:51:35 AM Total Procedure Duration: 0 hours 6 minutes 22 seconds  Findings:      The examined esophagus was normal.      The entire examined stomach was normal.      The examined duodenum was normal. Biopsies for histology were taken with       a cold forceps for evaluation of celiac disease. Impression:               - Normal esophagus.                           - Normal stomach.                           - Normal examined duodenum. Biopsied. Moderate Sedation:      Per Anesthesia Care Recommendation:           - Discharge patient to home (ambulatory).                           - Resume previous diet.                           - Await pathology results.                           - Return to GI clinic as previously scheduled.                           - Increase Linzess to daily intake (continue 72 mcg                            dosing) Procedure Code(s):        --- Professional ---                           78588, GC, Esophagogastroduodenoscopy, flexible,  transoral; with biopsy, single or multiple Diagnosis Code(s):        --- Professional ---                           R10.13, Epigastric pain CPT copyright 2019 American Medical Association. All rights reserved. The codes documented in this report are preliminary and upon coder review may  be revised to meet current compliance requirements. [name], MD [name],  07/10/2020 10:55:40 AM This report has been signed electronically. Number of Addenda: 0

## 2020-07-10 NOTE — Anesthesia Preprocedure Evaluation (Addendum)
Anesthesia Evaluation  Patient identified by MRN, date of birth, ID band Patient awake    Reviewed: Allergy & Precautions, NPO status , Patient's Chart, lab work & pertinent test results  History of Anesthesia Complications Negative for: history of anesthetic complications  Airway Mallampati: I  TM Distance: >3 FB Neck ROM: Full    Dental  (+) Dental Advisory Given, Teeth Intact   Pulmonary neg pulmonary ROS,    Pulmonary exam normal breath sounds clear to auscultation       Cardiovascular Exercise Tolerance: Good Normal cardiovascular exam Rhythm:Regular Rate:Normal     Neuro/Psych negative neurological ROS  negative psych ROS   GI/Hepatic negative GI ROS, (+)     substance abuse (last use- 07/09/20)  marijuana use,   Endo/Other  negative endocrine ROS  Renal/GU negative Renal ROS     Musculoskeletal Multiple pelvic fxs    Abdominal   Peds  Hematology negative hematology ROS (+)   Anesthesia Other Findings   Reproductive/Obstetrics negative OB ROS                            Anesthesia Physical Anesthesia Plan  ASA: II  Anesthesia Plan: General   Post-op Pain Management:    Induction: Intravenous  PONV Risk Score and Plan: TIVA  Airway Management Planned: Nasal Cannula and Natural Airway  Additional Equipment:   Intra-op Plan:   Post-operative Plan:   Informed Consent: I have reviewed the patients History and Physical, chart, labs and discussed the procedure including the risks, benefits and alternatives for the proposed anesthesia with the patient or authorized representative who has indicated his/her understanding and acceptance.     Dental advisory given  Plan Discussed with: CRNA and Surgeon  Anesthesia Plan Comments:        Anesthesia Quick Evaluation

## 2020-07-10 NOTE — Transfer of Care (Signed)
Immediate Anesthesia Transfer of Care Note  Patient: Judy Lang  Procedure(s) Performed: ESOPHAGOGASTRODUODENOSCOPY (EGD) WITH PROPOFOL (N/A ) BIOPSY  Patient Location: Endoscopy Unit  Anesthesia Type:General  Level of Consciousness: awake and patient cooperative  Airway & Oxygen Therapy: Patient Spontanous Breathing  Post-op Assessment: Report given to RN, Post -op Vital signs reviewed and stable and Patient moving all extremities X 4  Post vital signs: Reviewed and stable  Last Vitals:  Vitals Value Taken Time  BP    Temp    Pulse    Resp    SpO2      Last Pain:  Vitals:   07/10/20 0807  TempSrc: Oral  PainSc: 0-No pain      Patients Stated Pain Goal: 7 (49/44/73 9584)  Complications: No complications documented.

## 2020-07-11 LAB — SURGICAL PATHOLOGY

## 2020-07-12 ENCOUNTER — Ambulatory Visit: Payer: Medicaid Other | Admitting: Advanced Practice Midwife

## 2020-07-12 NOTE — Progress Notes (Signed)
error 

## 2020-07-16 ENCOUNTER — Encounter (HOSPITAL_COMMUNITY): Payer: Self-pay | Admitting: Gastroenterology

## 2020-07-19 ENCOUNTER — Encounter: Payer: Self-pay | Admitting: Advanced Practice Midwife

## 2020-07-19 ENCOUNTER — Ambulatory Visit (INDEPENDENT_AMBULATORY_CARE_PROVIDER_SITE_OTHER): Payer: Medicaid Other | Admitting: Advanced Practice Midwife

## 2020-07-19 ENCOUNTER — Telehealth (INDEPENDENT_AMBULATORY_CARE_PROVIDER_SITE_OTHER): Payer: Self-pay | Admitting: Gastroenterology

## 2020-07-19 ENCOUNTER — Other Ambulatory Visit: Payer: Self-pay

## 2020-07-19 VITALS — BP 124/84 | HR 108 | Ht 63.0 in | Wt 99.0 lb

## 2020-07-19 DIAGNOSIS — Z3202 Encounter for pregnancy test, result negative: Secondary | ICD-10-CM

## 2020-07-19 DIAGNOSIS — Z30017 Encounter for initial prescription of implantable subdermal contraceptive: Secondary | ICD-10-CM

## 2020-07-19 DIAGNOSIS — R8290 Unspecified abnormal findings in urine: Secondary | ICD-10-CM | POA: Diagnosis not present

## 2020-07-19 DIAGNOSIS — Z1389 Encounter for screening for other disorder: Secondary | ICD-10-CM | POA: Diagnosis not present

## 2020-07-19 LAB — POCT URINALYSIS DIPSTICK OB
Glucose, UA: NEGATIVE
Leukocytes, UA: NEGATIVE
Nitrite, UA: NEGATIVE

## 2020-07-19 LAB — POCT URINE PREGNANCY: Preg Test, Ur: NEGATIVE

## 2020-07-19 MED ORDER — ETONOGESTREL 68 MG ~~LOC~~ IMPL
68.0000 mg | DRUG_IMPLANT | Freq: Once | SUBCUTANEOUS | Status: AC
  Administered 2020-07-19: 68 mg via SUBCUTANEOUS

## 2020-07-19 MED ORDER — PROMETHAZINE HCL 25 MG RE SUPP: 25.0000 mg | Freq: Four times a day (QID) | RECTAL | 0 refills | Status: DC | PRN
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
## 2020-07-19 NOTE — Telephone Encounter (Signed)
Addressed see other note

## 2020-07-19 NOTE — Telephone Encounter (Signed)
Agree with patient going to hospital as she has not been able to keep anything down and severe abdominal pain.Had normal EGD recently, may consider repeating abdominal imaging in the ER.  Thanks,  [name], MD Gastroenterology and Hepatology [facility]

## 2020-07-19 NOTE — Telephone Encounter (Signed)
Patient called stated she is vomiting today and has a bad headache wanted to let [facility] know - ph# [phone]

## 2020-07-19 NOTE — Telephone Encounter (Signed)
Patient returned your call.

## 2020-07-19 NOTE — Telephone Encounter (Addendum)
I tried calling to get more information.. No answer. Left a voicemail on patients phone.I spoke with the patient she states since her egd on 07/10/2020 she has had severe abdominal pain and vomiting cant keep water down she has been losing weight. She does not have a fever.She says she was hurting so bad this morning she thought abour going to the hospital. I advised that I would notify our Dr.but if she feels she needs to go to the hospital she should. Please advise.

## 2020-07-19 NOTE — Telephone Encounter (Signed)
Patient is aware of all and is planning on heading to the Ed.

## 2020-07-19 NOTE — Telephone Encounter (Signed)
Addressed by Dr Jenetta Downer and patient has been called w/ updated recs. Thanks.

## 2020-07-19 NOTE — Telephone Encounter (Signed)
I called and left a message on patient voice mail that Dr. Jenetta Downer recommended her going back to the hospital. I asked that she please call the office back to confirm receipt of message.

## 2020-07-21 LAB — URINE CULTURE

## 2020-08-07 DIAGNOSIS — G8929 Other chronic pain: Secondary | ICD-10-CM | POA: Diagnosis not present

## 2020-08-07 DIAGNOSIS — M51369 Other intervertebral disc degeneration, lumbar region without mention of lumbar back pain or lower extremity pain: Secondary | ICD-10-CM | POA: Insufficient documentation

## 2020-08-07 DIAGNOSIS — S3282XD Multiple fractures of pelvis without disruption of pelvic ring, subsequent encounter for fracture with routine healing: Secondary | ICD-10-CM | POA: Diagnosis not present

## 2020-08-07 DIAGNOSIS — M545 Low back pain, unspecified: Secondary | ICD-10-CM | POA: Diagnosis not present

## 2020-08-09 DIAGNOSIS — R109 Unspecified abdominal pain: Secondary | ICD-10-CM | POA: Diagnosis not present

## 2020-08-09 DIAGNOSIS — F4310 Post-traumatic stress disorder, unspecified: Secondary | ICD-10-CM | POA: Diagnosis not present

## 2020-08-09 DIAGNOSIS — M5459 Other low back pain: Secondary | ICD-10-CM | POA: Diagnosis not present

## 2020-08-09 DIAGNOSIS — F419 Anxiety disorder, unspecified: Secondary | ICD-10-CM | POA: Diagnosis not present

## 2020-09-05 ENCOUNTER — Other Ambulatory Visit (INDEPENDENT_AMBULATORY_CARE_PROVIDER_SITE_OTHER): Payer: Self-pay | Admitting: Gastroenterology

## 2020-09-19 DIAGNOSIS — M5416 Radiculopathy, lumbar region: Secondary | ICD-10-CM | POA: Insufficient documentation

## 2020-09-19 DIAGNOSIS — M5136 Other intervertebral disc degeneration, lumbar region: Secondary | ICD-10-CM | POA: Diagnosis not present

## 2020-10-06 DIAGNOSIS — M5416 Radiculopathy, lumbar region: Secondary | ICD-10-CM | POA: Diagnosis not present

## 2020-10-06 DIAGNOSIS — M5136 Other intervertebral disc degeneration, lumbar region: Secondary | ICD-10-CM | POA: Diagnosis not present

## 2020-10-09 DIAGNOSIS — Z6841 Body Mass Index (BMI) 40.0 and over, adult: Secondary | ICD-10-CM | POA: Diagnosis not present

## 2020-10-09 DIAGNOSIS — M545 Low back pain, unspecified: Secondary | ICD-10-CM | POA: Diagnosis not present

## 2020-10-09 DIAGNOSIS — G8929 Other chronic pain: Secondary | ICD-10-CM | POA: Diagnosis not present

## 2020-11-15 DIAGNOSIS — R29898 Other symptoms and signs involving the musculoskeletal system: Secondary | ICD-10-CM | POA: Diagnosis not present

## 2020-11-15 DIAGNOSIS — M19172 Post-traumatic osteoarthritis, left ankle and foot: Secondary | ICD-10-CM | POA: Diagnosis not present

## 2020-11-15 DIAGNOSIS — M12579 Traumatic arthropathy, unspecified ankle and foot: Secondary | ICD-10-CM | POA: Insufficient documentation

## 2020-11-15 DIAGNOSIS — M19171 Post-traumatic osteoarthritis, right ankle and foot: Secondary | ICD-10-CM | POA: Diagnosis not present

## 2020-11-16 DIAGNOSIS — R200 Anesthesia of skin: Secondary | ICD-10-CM | POA: Diagnosis not present

## 2020-11-16 DIAGNOSIS — M79602 Pain in left arm: Secondary | ICD-10-CM | POA: Insufficient documentation

## 2020-11-16 DIAGNOSIS — M542 Cervicalgia: Secondary | ICD-10-CM | POA: Insufficient documentation

## 2020-11-16 DIAGNOSIS — R29898 Other symptoms and signs involving the musculoskeletal system: Secondary | ICD-10-CM | POA: Insufficient documentation

## 2020-11-23 DIAGNOSIS — E559 Vitamin D deficiency, unspecified: Secondary | ICD-10-CM | POA: Diagnosis not present

## 2020-11-23 DIAGNOSIS — S41112A Laceration without foreign body of left upper arm, initial encounter: Secondary | ICD-10-CM | POA: Diagnosis not present

## 2020-11-23 DIAGNOSIS — R634 Abnormal weight loss: Secondary | ICD-10-CM | POA: Diagnosis not present

## 2020-11-23 DIAGNOSIS — Z681 Body mass index (BMI) 19 or less, adult: Secondary | ICD-10-CM | POA: Diagnosis not present

## 2020-11-23 DIAGNOSIS — F4310 Post-traumatic stress disorder, unspecified: Secondary | ICD-10-CM | POA: Diagnosis not present

## 2020-11-23 DIAGNOSIS — M545 Low back pain, unspecified: Secondary | ICD-10-CM | POA: Diagnosis not present

## 2020-11-23 DIAGNOSIS — R109 Unspecified abdominal pain: Secondary | ICD-10-CM | POA: Diagnosis not present

## 2020-11-23 DIAGNOSIS — R739 Hyperglycemia, unspecified: Secondary | ICD-10-CM | POA: Diagnosis not present

## 2020-11-23 DIAGNOSIS — F419 Anxiety disorder, unspecified: Secondary | ICD-10-CM | POA: Diagnosis not present

## 2020-12-28 DIAGNOSIS — M542 Cervicalgia: Secondary | ICD-10-CM | POA: Diagnosis not present

## 2021-01-23 DIAGNOSIS — E559 Vitamin D deficiency, unspecified: Secondary | ICD-10-CM | POA: Diagnosis not present

## 2021-01-23 DIAGNOSIS — R109 Unspecified abdominal pain: Secondary | ICD-10-CM | POA: Diagnosis not present

## 2021-01-23 DIAGNOSIS — M545 Low back pain, unspecified: Secondary | ICD-10-CM | POA: Diagnosis not present

## 2021-01-23 DIAGNOSIS — R634 Abnormal weight loss: Secondary | ICD-10-CM | POA: Diagnosis not present

## 2021-01-29 ENCOUNTER — Other Ambulatory Visit (INDEPENDENT_AMBULATORY_CARE_PROVIDER_SITE_OTHER): Payer: Self-pay

## 2021-01-29 DIAGNOSIS — K5909 Other constipation: Secondary | ICD-10-CM

## 2021-01-29 DIAGNOSIS — R112 Nausea with vomiting, unspecified: Secondary | ICD-10-CM

## 2021-01-29 DIAGNOSIS — R1084 Generalized abdominal pain: Secondary | ICD-10-CM

## 2021-01-29 MED ORDER — HYOSCYAMINE SULFATE 0.125 MG SL SUBL: SUBLINGUAL_TABLET | SUBLINGUAL | 3 refills | Status: DC

## 2021-01-29 MED ORDER — LINACLOTIDE 72 MCG PO CAPS: 72.0000 ug | ORAL_CAPSULE | Freq: Every day | ORAL | 1 refills | Status: DC

## 2021-01-30 ENCOUNTER — Ambulatory Visit: Payer: Medicaid Other | Admitting: Adult Health

## 2021-01-30 ENCOUNTER — Other Ambulatory Visit: Payer: Self-pay

## 2021-01-30 ENCOUNTER — Encounter: Payer: Self-pay | Admitting: Adult Health

## 2021-01-30 VITALS — BP 85/62 | HR 97 | Ht 63.0 in | Wt 96.0 lb

## 2021-01-30 DIAGNOSIS — Z3046 Encounter for surveillance of implantable subdermal contraceptive: Secondary | ICD-10-CM | POA: Insufficient documentation

## 2021-01-30 DIAGNOSIS — Z319 Encounter for procreative management, unspecified: Secondary | ICD-10-CM

## 2021-01-30 MED ORDER — PNV PRENATAL PLUS MULTIVITAMIN 27-1 MG PO TABS: ORAL_TABLET | ORAL | 12 refills | Status: DC

## 2021-01-30 NOTE — Progress Notes (Signed)
  Subjective:     Patient ID: Judy Lang, female   DOB: [birth year], 35 y.o.   MRN: [id number]  HPI Judy Lang is a 35 year old white female, single, [id number] in for nexplanon removal, she wants to get pregnant. Last pap 07/13/17, normal with negative HPV. PCP is A Boles PA.  Review of Systems For nexplanon removal Has had stomach pain for over a year, has seen GI Has had some pain with sex and bleeding after sex Has had decreased libido   Reviewed past medical,surgical, social and family history. Reviewed medications and allergies.     Objective:   Physical Exam BP (!) 85/62 (BP Location: Left Arm, Patient Position: Sitting, Cuff Size: Normal)   Pulse 97   Ht 5\' 3"  (1.6 m)   Wt 96 lb (43.5 kg)   BMI 17.01 kg/m  Consent signed, time out called. Left arm cleansed with betadine, and injected with 1.5 cc 1% lidocaine and waited til numb.Under sterile technique a #11 blade was used to make small vertical incision, and a curved forceps was used to easily remove rod. Steri strips applied. Pressure dressing applied.   Fall risk is low  [facility] - 01/30/21 1424      Pregnancy Intention Screening   Does the patient want to become pregnant in the next year? Yes    Does the patient's partner want to become pregnant in the next year? Yes    Would the patient like to discuss contraceptive options today? No      Contraception Wrap Up   Current Method Hormonal Implant    End Method Pregnant/Seeking Pregnancy    Contraception Counseling Provided No          Assessment:     1. Encounter for Nexplanon removal - keep clean and dry x 24 hours, no heavy lifting, keep steri strips on x 72 hours, Keep pressure dressing on x 24 hours. Follow up prn problems.  2. Patient desires pregnancy Will rx PNV Meds ordered this encounter  Medications  . Prenatal Vit-Fe Fumarate-FA (PNV PRENATAL PLUS MULTIVITAMIN) 27-1 MG TABS    Sig: Take 1 daily    Dispense:  30 tablet    Refill:  12    Order  Specific Question:   Supervising Provider    Answer:   Florian Buff [[id number]]      Plan:     Return in 2 weeks for pap and physical and see if pain any better since nexplanon removed.

## 2021-01-30 NOTE — Patient Instructions (Signed)
-  keep clean and dry x 24 hours, no heavy lifting, keep steri strips on x 72 hours, Keep pressure dressing on x 24 hours. Follow up prn problems.

## 2021-01-31 ENCOUNTER — Encounter: Payer: Medicaid Other | Admitting: Advanced Practice Midwife

## 2021-01-31 DIAGNOSIS — F4310 Post-traumatic stress disorder, unspecified: Secondary | ICD-10-CM | POA: Diagnosis not present

## 2021-01-31 DIAGNOSIS — F419 Anxiety disorder, unspecified: Secondary | ICD-10-CM | POA: Diagnosis not present

## 2021-01-31 DIAGNOSIS — R109 Unspecified abdominal pain: Secondary | ICD-10-CM | POA: Diagnosis not present

## 2021-01-31 DIAGNOSIS — E559 Vitamin D deficiency, unspecified: Secondary | ICD-10-CM | POA: Diagnosis not present

## 2021-01-31 DIAGNOSIS — M545 Low back pain, unspecified: Secondary | ICD-10-CM | POA: Diagnosis not present

## 2021-02-11 ENCOUNTER — Encounter (INDEPENDENT_AMBULATORY_CARE_PROVIDER_SITE_OTHER): Payer: Self-pay | Admitting: Gastroenterology

## 2021-02-11 ENCOUNTER — Ambulatory Visit (INDEPENDENT_AMBULATORY_CARE_PROVIDER_SITE_OTHER): Payer: Medicaid Other | Admitting: Gastroenterology

## 2021-02-11 ENCOUNTER — Other Ambulatory Visit: Payer: Self-pay

## 2021-02-11 VITALS — BP 107/68 | HR 78 | Temp 98.5°F | Ht 63.0 in | Wt 92.0 lb

## 2021-02-11 DIAGNOSIS — R1033 Periumbilical pain: Secondary | ICD-10-CM | POA: Diagnosis not present

## 2021-02-11 DIAGNOSIS — R109 Unspecified abdominal pain: Secondary | ICD-10-CM | POA: Insufficient documentation

## 2021-02-11 DIAGNOSIS — E46 Unspecified protein-calorie malnutrition: Secondary | ICD-10-CM | POA: Insufficient documentation

## 2021-02-11 DIAGNOSIS — K5900 Constipation, unspecified: Secondary | ICD-10-CM | POA: Diagnosis not present

## 2021-02-11 DIAGNOSIS — R112 Nausea with vomiting, unspecified: Secondary | ICD-10-CM

## 2021-02-11 DIAGNOSIS — E440 Moderate protein-calorie malnutrition: Secondary | ICD-10-CM | POA: Diagnosis not present

## 2021-02-11 DIAGNOSIS — K581 Irritable bowel syndrome with constipation: Secondary | ICD-10-CM | POA: Insufficient documentation

## 2021-02-11 MED ORDER — ONDANSETRON HCL 4 MG PO TABS: 4.0000 mg | ORAL_TABLET | Freq: Three times a day (TID) | ORAL | 0 refills | Status: DC | PRN

## 2021-02-11 MED ORDER — PEG 3350-KCL-NA BICARB-NACL 420 G PO SOLR: 4000.0000 mL | Freq: Once | ORAL | 0 refills | Status: AC

## 2021-02-11 NOTE — Patient Instructions (Addendum)
Take bowel prep, after finishing it must start Linzess 72 mcg daily Start IBGard 1 tablet every 8-12 hours as needed for abdominal pain/bloating Can take Zofran as needed for nausea Continue Levsin as needed for abdominal pain Nutrition referral Continue with protein shakes 3 times a day Eat 5-6 small size meals per day
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
## 2021-02-13 ENCOUNTER — Other Ambulatory Visit: Payer: Medicaid Other | Admitting: Adult Health

## 2021-02-22 DIAGNOSIS — H5213 Myopia, bilateral: Secondary | ICD-10-CM | POA: Diagnosis not present

## 2021-02-25 ENCOUNTER — Ambulatory Visit: Payer: Medicaid Other | Admitting: Nutrition

## 2021-03-07 ENCOUNTER — Ambulatory Visit: Payer: Medicaid Other | Admitting: Nutrition

## 2021-03-26 ENCOUNTER — Other Ambulatory Visit (INDEPENDENT_AMBULATORY_CARE_PROVIDER_SITE_OTHER): Payer: Self-pay | Admitting: Gastroenterology

## 2021-03-26 DIAGNOSIS — R112 Nausea with vomiting, unspecified: Secondary | ICD-10-CM

## 2021-03-26 NOTE — Telephone Encounter (Signed)
Last seen 02/11/2021 for Nausea and Vomiting by Dr. Jenetta Downer.

## 2021-04-19 ENCOUNTER — Other Ambulatory Visit (INDEPENDENT_AMBULATORY_CARE_PROVIDER_SITE_OTHER): Payer: Self-pay | Admitting: Gastroenterology

## 2021-04-19 DIAGNOSIS — R112 Nausea with vomiting, unspecified: Secondary | ICD-10-CM

## 2021-05-09 DIAGNOSIS — L0231 Cutaneous abscess of buttock: Secondary | ICD-10-CM | POA: Diagnosis not present

## 2021-05-23 ENCOUNTER — Ambulatory Visit (INDEPENDENT_AMBULATORY_CARE_PROVIDER_SITE_OTHER): Payer: Medicaid Other | Admitting: Gastroenterology

## 2021-05-23 ENCOUNTER — Other Ambulatory Visit: Payer: Self-pay

## 2021-05-23 ENCOUNTER — Encounter (INDEPENDENT_AMBULATORY_CARE_PROVIDER_SITE_OTHER): Payer: Self-pay | Admitting: Gastroenterology

## 2021-05-23 VITALS — BP 97/68 | HR 87 | Temp 99.4°F | Ht 63.0 in | Wt 99.4 lb

## 2021-05-23 DIAGNOSIS — R109 Unspecified abdominal pain: Secondary | ICD-10-CM | POA: Diagnosis not present

## 2021-05-23 DIAGNOSIS — K219 Gastro-esophageal reflux disease without esophagitis: Secondary | ICD-10-CM

## 2021-05-23 DIAGNOSIS — R110 Nausea: Secondary | ICD-10-CM

## 2021-05-23 DIAGNOSIS — E440 Moderate protein-calorie malnutrition: Secondary | ICD-10-CM

## 2021-05-23 DIAGNOSIS — K581 Irritable bowel syndrome with constipation: Secondary | ICD-10-CM | POA: Diagnosis not present

## 2021-05-23 DIAGNOSIS — L0231 Cutaneous abscess of buttock: Secondary | ICD-10-CM | POA: Diagnosis not present

## 2021-05-23 DIAGNOSIS — M545 Low back pain, unspecified: Secondary | ICD-10-CM | POA: Diagnosis not present

## 2021-05-23 DIAGNOSIS — F419 Anxiety disorder, unspecified: Secondary | ICD-10-CM | POA: Diagnosis not present

## 2021-05-23 DIAGNOSIS — F4310 Post-traumatic stress disorder, unspecified: Secondary | ICD-10-CM | POA: Diagnosis not present

## 2021-05-23 DIAGNOSIS — R634 Abnormal weight loss: Secondary | ICD-10-CM | POA: Diagnosis not present

## 2021-05-23 NOTE — Patient Instructions (Addendum)
-  Try taking Linzess every day to see if this helps you have bowel movements easier -You can also try taking hyoscyamine (levsin) when you are having more difficulty getting your stools out (up to twice a day) -Try taking pepcid (famotidine) instead of tums when you have episodes of heart burn, this medication is over the counter -continue diet high in calories and protein to help with weight gain  Follow up 3-4 months
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
## 2021-06-20 ENCOUNTER — Other Ambulatory Visit: Payer: Medicaid Other | Admitting: Adult Health

## 2021-07-12 ENCOUNTER — Telehealth (INDEPENDENT_AMBULATORY_CARE_PROVIDER_SITE_OTHER): Payer: Self-pay

## 2021-07-12 NOTE — Telephone Encounter (Signed)
Patient called today wanting refills on her hyoscyamine. She states she just had it filled,but would like to have refills just incase sent to [facility].

## 2021-07-13 ENCOUNTER — Other Ambulatory Visit (INDEPENDENT_AMBULATORY_CARE_PROVIDER_SITE_OTHER): Payer: Self-pay | Admitting: Gastroenterology

## 2021-07-13 DIAGNOSIS — K5909 Other constipation: Secondary | ICD-10-CM

## 2021-07-13 DIAGNOSIS — R1084 Generalized abdominal pain: Secondary | ICD-10-CM

## 2021-07-13 DIAGNOSIS — R112 Nausea with vomiting, unspecified: Secondary | ICD-10-CM

## 2021-07-18 ENCOUNTER — Other Ambulatory Visit (INDEPENDENT_AMBULATORY_CARE_PROVIDER_SITE_OTHER): Payer: Self-pay | Admitting: Gastroenterology

## 2021-07-18 DIAGNOSIS — R1084 Generalized abdominal pain: Secondary | ICD-10-CM

## 2021-07-18 DIAGNOSIS — K5909 Other constipation: Secondary | ICD-10-CM

## 2021-07-18 DIAGNOSIS — R112 Nausea with vomiting, unspecified: Secondary | ICD-10-CM

## 2021-07-18 MED ORDER — HYOSCYAMINE SULFATE 0.125 MG PO TBDP: 0.1250 mg | ORAL_TABLET | Freq: Four times a day (QID) | ORAL | 1 refills | Status: DC | PRN

## 2021-07-18 NOTE — Telephone Encounter (Signed)
Patient aware medication sent to pharmacy...

## 2021-07-18 NOTE — Telephone Encounter (Signed)
Refill sent.

## 2021-08-22 ENCOUNTER — Other Ambulatory Visit: Payer: Self-pay

## 2021-08-22 ENCOUNTER — Encounter: Payer: Self-pay | Admitting: Adult Health

## 2021-08-22 ENCOUNTER — Ambulatory Visit (INDEPENDENT_AMBULATORY_CARE_PROVIDER_SITE_OTHER): Payer: Medicaid Other | Admitting: Adult Health

## 2021-08-22 ENCOUNTER — Other Ambulatory Visit (HOSPITAL_COMMUNITY)
Admission: RE | Admit: 2021-08-22 | Discharge: 2021-08-22 | Disposition: A | Discharge status: Home / Self Care | Payer: Medicaid Other | Source: Ambulatory Visit | Attending: Adult Health | Admitting: Adult Health

## 2021-08-22 VITALS — BP 104/67 | HR 81 | Ht 64.0 in | Wt 99.0 lb

## 2021-08-22 DIAGNOSIS — Z01419 Encounter for gynecological examination (general) (routine) without abnormal findings: Secondary | ICD-10-CM | POA: Diagnosis not present

## 2021-08-22 DIAGNOSIS — F419 Anxiety disorder, unspecified: Secondary | ICD-10-CM

## 2021-08-22 DIAGNOSIS — R6882 Decreased libido: Secondary | ICD-10-CM

## 2021-08-22 DIAGNOSIS — N921 Excessive and frequent menstruation with irregular cycle: Secondary | ICD-10-CM

## 2021-08-22 MED ORDER — METRONIDAZOLE 0.75 % VA GEL: 1.0000 | Freq: Every day | VAGINAL | 0 refills | Status: DC

## 2021-08-22 MED ORDER — BUSPIRONE HCL 5 MG PO TABS: 5.0000 mg | ORAL_TABLET | Freq: Two times a day (BID) | ORAL | 3 refills | Status: DC
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
## 2021-08-27 LAB — CYTOLOGY - PAP
Adequacy: ABSENT
Chlamydia: NEGATIVE
Comment: NEGATIVE
Comment: NEGATIVE
Comment: NORMAL
Diagnosis: NEGATIVE
High risk HPV: NEGATIVE
Neisseria Gonorrhea: NEGATIVE

## 2021-09-23 ENCOUNTER — Ambulatory Visit (INDEPENDENT_AMBULATORY_CARE_PROVIDER_SITE_OTHER): Payer: Medicaid Other | Admitting: Gastroenterology

## 2021-09-23 ENCOUNTER — Encounter (INDEPENDENT_AMBULATORY_CARE_PROVIDER_SITE_OTHER): Payer: Self-pay | Admitting: Gastroenterology

## 2021-09-23 ENCOUNTER — Other Ambulatory Visit: Payer: Self-pay

## 2021-09-23 VITALS — BP 104/70 | HR 71 | Temp 98.1°F | Ht 62.0 in | Wt 102.9 lb

## 2021-09-23 DIAGNOSIS — K581 Irritable bowel syndrome with constipation: Secondary | ICD-10-CM

## 2021-09-23 DIAGNOSIS — E440 Moderate protein-calorie malnutrition: Secondary | ICD-10-CM | POA: Diagnosis not present

## 2021-09-23 NOTE — Patient Instructions (Signed)
Continue Linzess 72 mcg qday Continue Levsin and peppermint as needed for abdominal pain and bloating Can take lactase OTC pills if taking milk based products
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
## 2021-10-03 ENCOUNTER — Ambulatory Visit: Payer: Medicaid Other | Admitting: Adult Health

## 2021-12-03 DIAGNOSIS — F419 Anxiety disorder, unspecified: Secondary | ICD-10-CM | POA: Diagnosis not present

## 2021-12-03 DIAGNOSIS — Z681 Body mass index (BMI) 19 or less, adult: Secondary | ICD-10-CM | POA: Diagnosis not present

## 2021-12-03 DIAGNOSIS — M545 Low back pain, unspecified: Secondary | ICD-10-CM | POA: Diagnosis not present

## 2021-12-03 DIAGNOSIS — F4310 Post-traumatic stress disorder, unspecified: Secondary | ICD-10-CM | POA: Diagnosis not present

## 2022-01-14 DIAGNOSIS — Z681 Body mass index (BMI) 19 or less, adult: Secondary | ICD-10-CM | POA: Diagnosis not present

## 2022-01-14 DIAGNOSIS — M545 Low back pain, unspecified: Secondary | ICD-10-CM | POA: Diagnosis not present

## 2022-01-14 DIAGNOSIS — F419 Anxiety disorder, unspecified: Secondary | ICD-10-CM | POA: Diagnosis not present

## 2022-01-14 DIAGNOSIS — F4310 Post-traumatic stress disorder, unspecified: Secondary | ICD-10-CM | POA: Diagnosis not present

## 2022-01-14 DIAGNOSIS — I10 Essential (primary) hypertension: Secondary | ICD-10-CM | POA: Diagnosis not present

## 2022-01-29 ENCOUNTER — Other Ambulatory Visit (INDEPENDENT_AMBULATORY_CARE_PROVIDER_SITE_OTHER): Payer: Self-pay | Admitting: Gastroenterology

## 2022-01-29 NOTE — Telephone Encounter (Signed)
Seen 09/23/21 ?

## 2022-02-14 IMAGING — US US ABDOMEN COMPLETE
1 series · 14 of 25 positions shown · non-contrast
Comparison: None.

CLINICAL DATA: Abdominal pain with nausea and vomiting.

EXAM:
ABDOMEN ULTRASOUND COMPLETE

[Series 1: us abdomen complete · 14 of 128 slices shown]
[im 1/128]
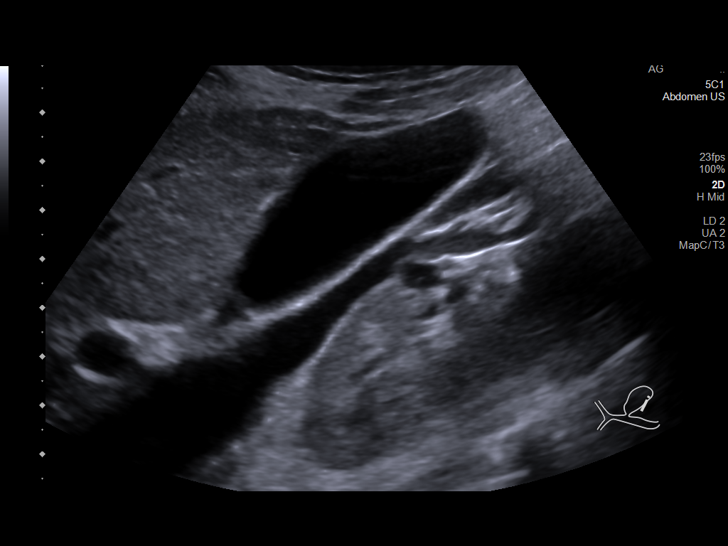
[im 11/128]
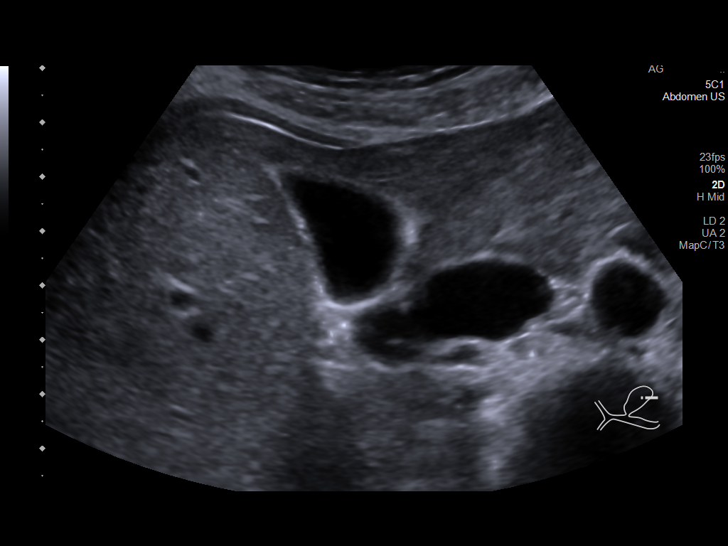
[im 22/128]
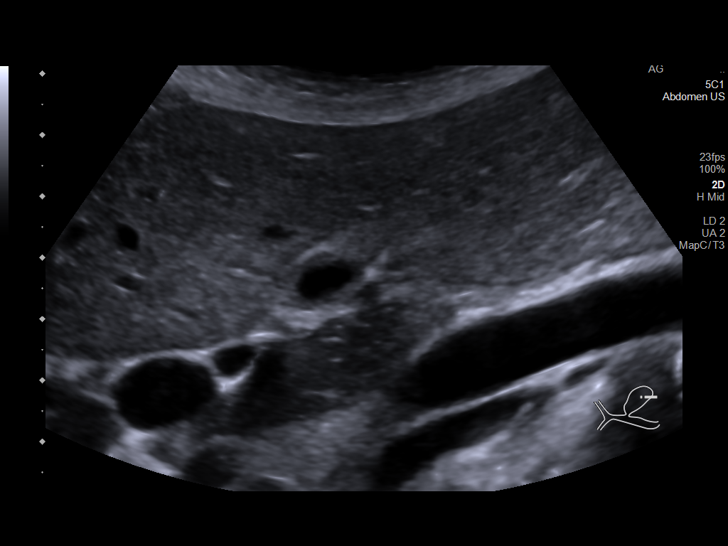
[im 32/128]
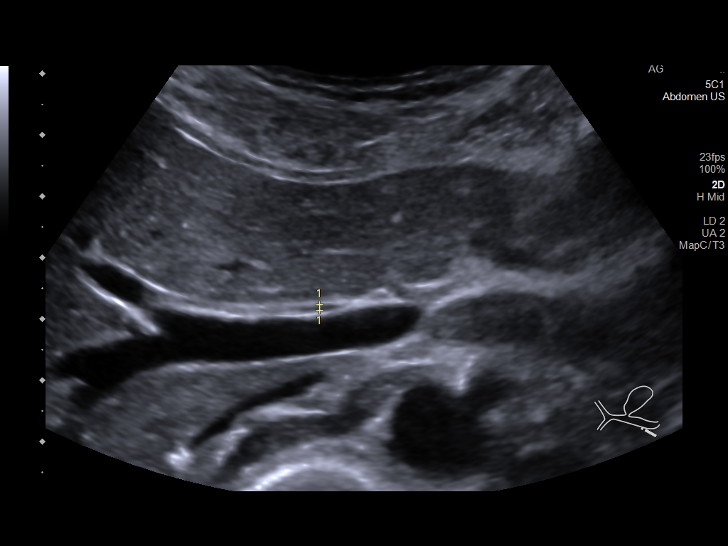
[im 43/128]
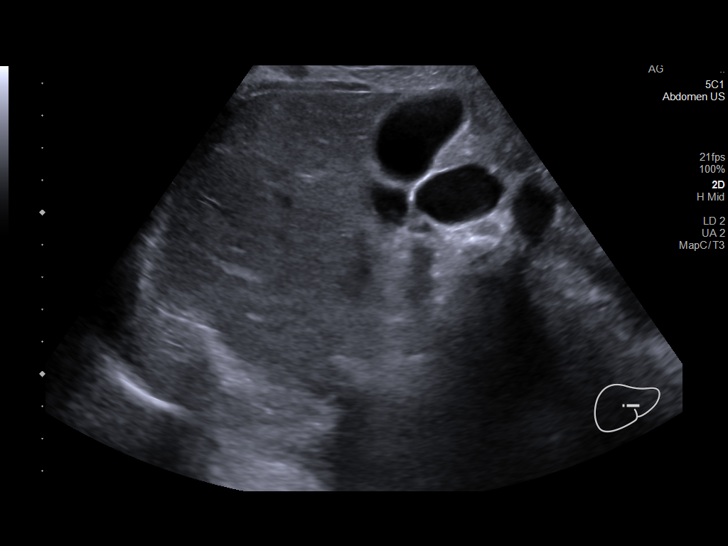
[im 48/128]
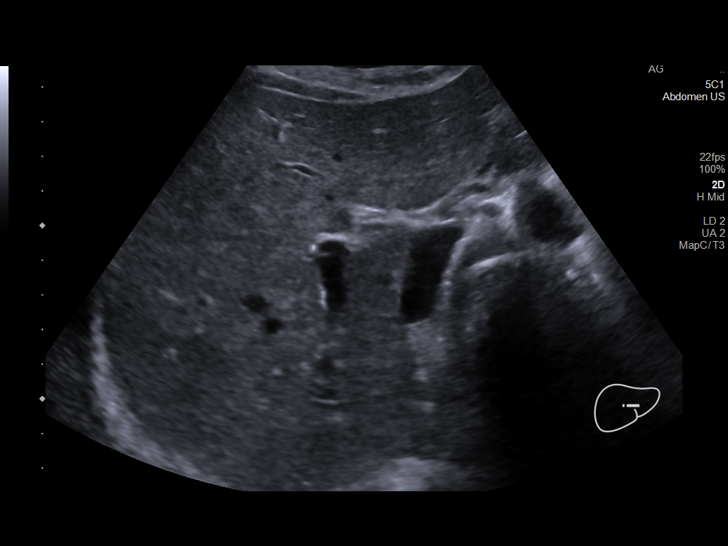
[im 59/128]
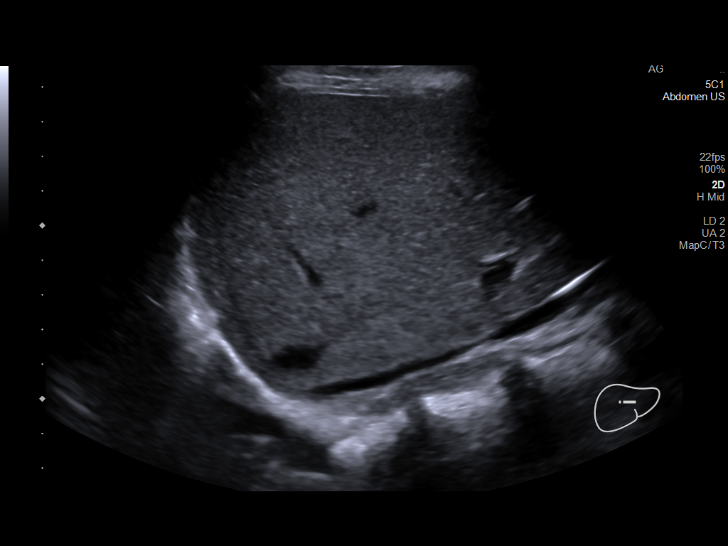
[im 69/128]
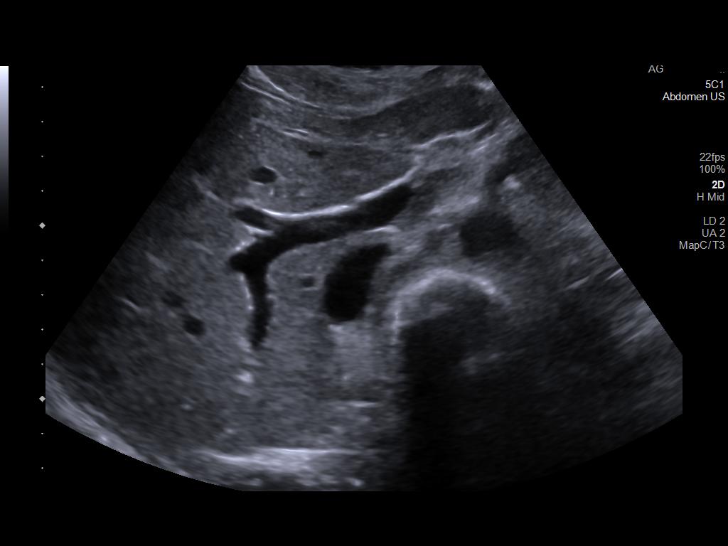
[im 80/128]
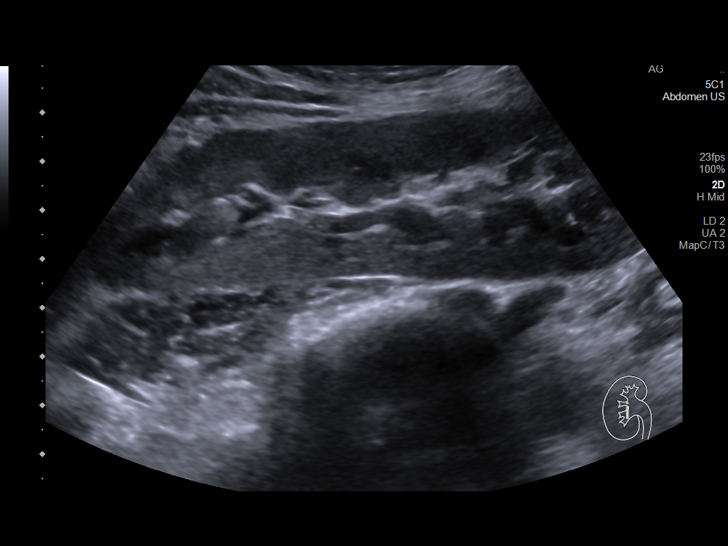
[im 85/128]
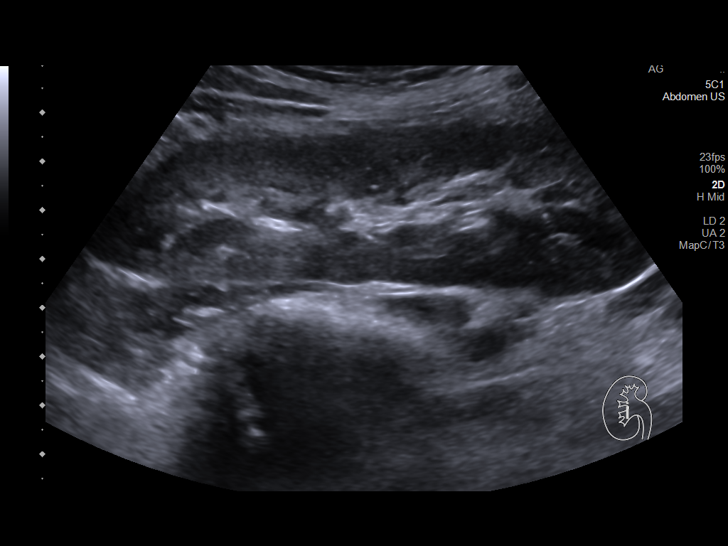
[im 96/128]
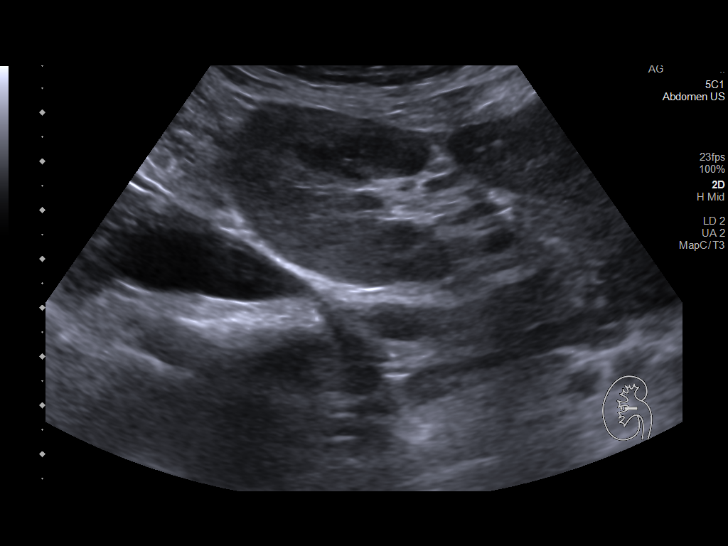
[im 106/128]
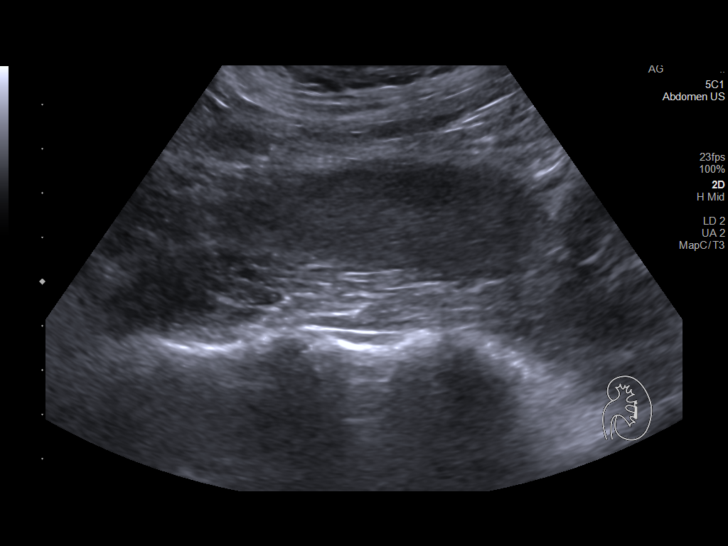
[im 117/128]
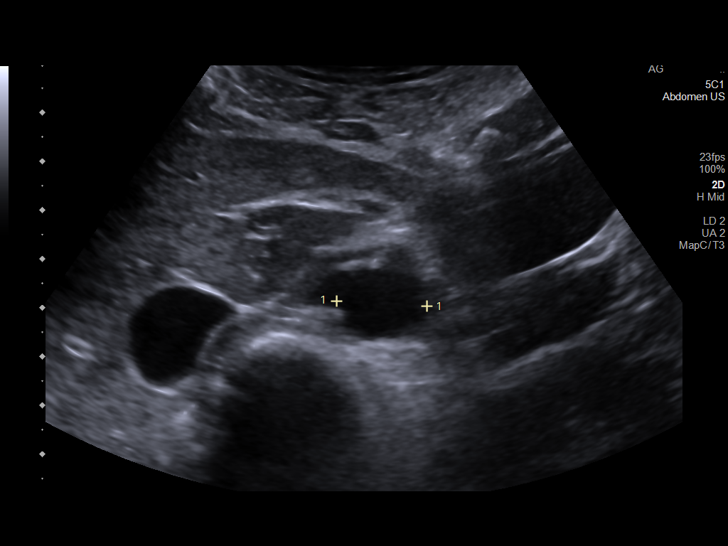
[im 128/128]
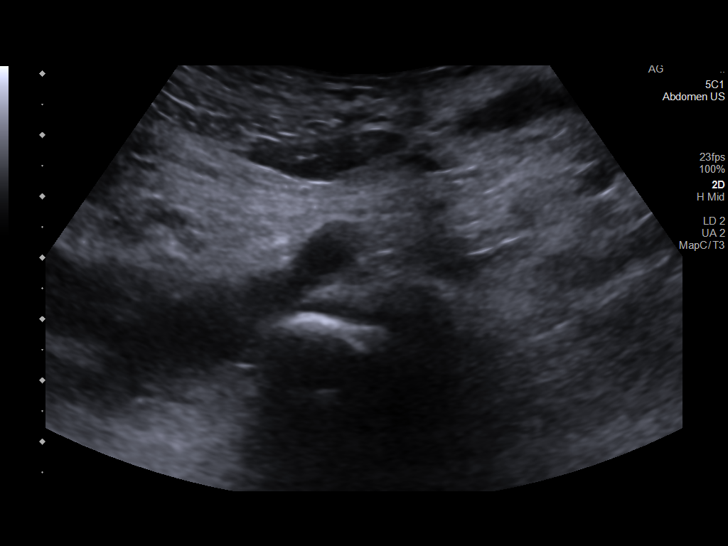

[14 of 25 positions shown; findings below may reference images not displayed]

FINDINGS: Gallbladder: No gallstones or wall thickening visualized. No
sonographic Murphy sign noted by sonographer.

Common bile duct: Diameter: 1.0 mm

Liver: No focal lesion identified. Within normal limits in
parenchymal echogenicity. Portal vein is patent on color Doppler
imaging with normal direction of blood flow towards the liver.

IVC: No abnormality visualized.

Pancreas: Visualized portion unremarkable.

Spleen: Size and appearance within normal limits.

Right Kidney: Length: 11.9 cm. Echogenicity within normal limits. No
mass or hydronephrosis visualized.

Left Kidney: Length: 11.2 cm. Echogenicity within normal limits. No
mass or hydronephrosis visualized.

Abdominal aorta: No aneurysm visualized.

Other findings: None.
IMPRESSION: Negative ultrasound of the abdomen.

## 2022-03-24 ENCOUNTER — Encounter (INDEPENDENT_AMBULATORY_CARE_PROVIDER_SITE_OTHER): Payer: Self-pay | Admitting: Gastroenterology

## 2022-03-24 ENCOUNTER — Encounter (INDEPENDENT_AMBULATORY_CARE_PROVIDER_SITE_OTHER): Payer: Self-pay

## 2022-03-24 ENCOUNTER — Other Ambulatory Visit (INDEPENDENT_AMBULATORY_CARE_PROVIDER_SITE_OTHER): Payer: Self-pay

## 2022-03-24 ENCOUNTER — Ambulatory Visit (INDEPENDENT_AMBULATORY_CARE_PROVIDER_SITE_OTHER): Payer: Medicaid Other | Admitting: Gastroenterology

## 2022-03-24 VITALS — BP 102/73 | HR 87 | Temp 98.5°F | Ht 64.0 in | Wt 98.3 lb

## 2022-03-24 DIAGNOSIS — K581 Irritable bowel syndrome with constipation: Secondary | ICD-10-CM | POA: Diagnosis not present

## 2022-03-24 DIAGNOSIS — E440 Moderate protein-calorie malnutrition: Secondary | ICD-10-CM | POA: Diagnosis not present

## 2022-03-24 DIAGNOSIS — K649 Unspecified hemorrhoids: Secondary | ICD-10-CM | POA: Insufficient documentation

## 2022-03-24 DIAGNOSIS — R110 Nausea: Secondary | ICD-10-CM

## 2022-03-24 DIAGNOSIS — K640 First degree hemorrhoids: Secondary | ICD-10-CM | POA: Diagnosis not present

## 2022-03-24 DIAGNOSIS — Z01812 Encounter for preprocedural laboratory examination: Secondary | ICD-10-CM
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
## 2022-03-24 NOTE — Patient Instructions (Addendum)
Increase Linzess to 145 mcg qday - take two 72 mcg pills every day for now,  will refill 145 mcg dosing once you run out of the medicine Schedule colonoscopy prior to hemorrhoidal banding Follow up with PCP regarding anxiety chronic management
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
## 2022-04-01 ENCOUNTER — Telehealth (INDEPENDENT_AMBULATORY_CARE_PROVIDER_SITE_OTHER): Payer: Self-pay

## 2022-04-01 ENCOUNTER — Other Ambulatory Visit (HOSPITAL_COMMUNITY)
Admission: RE | Admit: 2022-04-01 | Discharge: 2022-04-01 | Disposition: A | Discharge status: Home / Self Care | Payer: Medicaid Other | Source: Ambulatory Visit | Attending: Gastroenterology | Admitting: Gastroenterology

## 2022-04-01 DIAGNOSIS — Z01812 Encounter for preprocedural laboratory examination: Secondary | ICD-10-CM | POA: Diagnosis present

## 2022-04-01 LAB — PREGNANCY, URINE: Preg Test, Ur: NEGATIVE

## 2022-04-01 MED ORDER — PEG 3350-KCL-NA BICARB-NACL 420 G PO SOLR: 4000.0000 mL | ORAL | 0 refills | Status: DC

## 2022-04-01 NOTE — Telephone Encounter (Signed)
Lemarcus Baggerly Ann Charmika Macdonnell, CMA  ?

## 2022-04-02 ENCOUNTER — Encounter (HOSPITAL_COMMUNITY)
Admission: RE | Disposition: A | Discharge status: Home / Self Care | Payer: Self-pay | Source: Ambulatory Visit | Attending: Gastroenterology

## 2022-04-02 ENCOUNTER — Other Ambulatory Visit: Payer: Self-pay

## 2022-04-02 ENCOUNTER — Ambulatory Visit (HOSPITAL_COMMUNITY)
Admission: RE | Admit: 2022-04-02 | Discharge: 2022-04-02 | Disposition: A | Discharge status: Home / Self Care | Payer: Medicaid Other | Source: Ambulatory Visit | Attending: Gastroenterology | Admitting: Gastroenterology

## 2022-04-02 ENCOUNTER — Encounter (HOSPITAL_COMMUNITY): Payer: Self-pay | Admitting: Gastroenterology

## 2022-04-02 ENCOUNTER — Ambulatory Visit (HOSPITAL_BASED_OUTPATIENT_CLINIC_OR_DEPARTMENT_OTHER): Payer: Medicaid Other | Admitting: Certified Registered"

## 2022-04-02 ENCOUNTER — Ambulatory Visit (HOSPITAL_COMMUNITY): Payer: Medicaid Other | Admitting: Certified Registered"

## 2022-04-02 DIAGNOSIS — E46 Unspecified protein-calorie malnutrition: Secondary | ICD-10-CM | POA: Diagnosis not present

## 2022-04-02 DIAGNOSIS — D125 Benign neoplasm of sigmoid colon: Secondary | ICD-10-CM | POA: Diagnosis not present

## 2022-04-02 DIAGNOSIS — K581 Irritable bowel syndrome with constipation: Secondary | ICD-10-CM | POA: Diagnosis not present

## 2022-04-02 DIAGNOSIS — K625 Hemorrhage of anus and rectum: Secondary | ICD-10-CM | POA: Diagnosis not present

## 2022-04-02 DIAGNOSIS — K635 Polyp of colon: Secondary | ICD-10-CM

## 2022-04-02 DIAGNOSIS — D123 Benign neoplasm of transverse colon: Secondary | ICD-10-CM | POA: Insufficient documentation

## 2022-04-02 DIAGNOSIS — F419 Anxiety disorder, unspecified: Secondary | ICD-10-CM | POA: Insufficient documentation

## 2022-04-02 DIAGNOSIS — K626 Ulcer of anus and rectum: Secondary | ICD-10-CM | POA: Diagnosis not present

## 2022-04-02 DIAGNOSIS — Z79899 Other long term (current) drug therapy: Secondary | ICD-10-CM | POA: Diagnosis not present

## 2022-04-02 DIAGNOSIS — Z681 Body mass index (BMI) 19 or less, adult: Secondary | ICD-10-CM | POA: Insufficient documentation

## 2022-04-02 DIAGNOSIS — K219 Gastro-esophageal reflux disease without esophagitis: Secondary | ICD-10-CM | POA: Insufficient documentation

## 2022-04-02 LAB — HM COLONOSCOPY

## 2022-04-02 SURGERY — COLONOSCOPY WITH PROPOFOL
Anesthesia: General

## 2022-04-02 MED ORDER — LIDOCAINE HCL (CARDIAC) PF 100 MG/5ML IV SOSY
PREFILLED_SYRINGE | INTRAVENOUS | Status: DC | PRN
  Administered 2022-04-02: 50 mg via INTRAVENOUS

## 2022-04-02 MED ORDER — LACTATED RINGERS IV SOLN
INTRAVENOUS | Status: DC
  Administered 2022-04-02: 1000 mL via INTRAVENOUS

## 2022-04-02 MED ORDER — PROPOFOL 500 MG/50ML IV EMUL
INTRAVENOUS | Status: DC | PRN
  Administered 2022-04-02: 150 ug/kg/min via INTRAVENOUS

## 2022-04-02 MED ORDER — PROPOFOL 10 MG/ML IV BOLUS
INTRAVENOUS | Status: DC | PRN
  Administered 2022-04-02: 50 mg via INTRAVENOUS
  Administered 2022-04-02: 100 mg via INTRAVENOUS

## 2022-04-02 NOTE — Anesthesia Procedure Notes (Signed)
Date/Time: 04/02/2022 11:50 AM  Performed by: Orlie Dakin, CRNAPre-anesthesia Checklist: Patient identified, Emergency Drugs available, Suction available and Patient being monitored Patient Re-evaluated:Patient Re-evaluated prior to induction Oxygen Delivery Method: Nasal cannula Induction Type: IV induction Placement Confirmation: positive ETCO2

## 2022-04-02 NOTE — Discharge Instructions (Signed)
You are being discharged to home.  Resume your previous diet.  We are waiting for your pathology results.  Your physician has recommended a repeat colonoscopy for surveillance based on pathology results.  Continue Linzess 145 mcg qday, can uptitrate if having to strain to have bowel movements.

## 2022-04-02 NOTE — Interval H&P Note (Signed)
History and Physical Interval Note:  04/02/2022 11:18 AM  Judy Lang  has presented today for surgery, with the diagnosis of Rectal Pain.  The various methods of treatment have been discussed with the patient and family. After consideration of risks, benefits and other options for treatment, the patient has consented to  Procedure(s) with comments: COLONOSCOPY WITH PROPOFOL (N/A) - 7824 as a surgical intervention.  The patient's history has been reviewed, patient examined, no change in status, stable for surgery.  I have reviewed the patient's chart and labs.  Questions were answered to the patient's satisfaction.     [name]

## 2022-04-02 NOTE — Anesthesia Preprocedure Evaluation (Signed)
Anesthesia Evaluation  Patient identified by MRN, date of birth, ID band Patient awake    Reviewed: Allergy & Precautions, H&P , NPO status , Patient's Chart, lab work & pertinent test results, reviewed documented beta blocker date and time   Airway Mallampati: II  TM Distance: >3 FB Neck ROM: full    Dental no notable dental hx.    Pulmonary neg pulmonary ROS,    Pulmonary exam normal breath sounds clear to auscultation       Cardiovascular Exercise Tolerance: Good negative cardio ROS   Rhythm:regular Rate:Normal     Neuro/Psych PSYCHIATRIC DISORDERS Anxiety negative neurological ROS     GI/Hepatic Neg liver ROS, GERD  Medicated,  Endo/Other  negative endocrine ROS  Renal/GU negative Renal ROS  negative genitourinary   Musculoskeletal   Abdominal   Peds  Hematology negative hematology ROS (+)   Anesthesia Other Findings   Reproductive/Obstetrics negative OB ROS                             Anesthesia Physical Anesthesia Plan  ASA: 2  Anesthesia Plan: General   Post-op Pain Management:    Induction:   PONV Risk Score and Plan: Propofol infusion  Airway Management Planned:   Additional Equipment:   Intra-op Plan:   Post-operative Plan:   Informed Consent: I have reviewed the patients History and Physical, chart, labs and discussed the procedure including the risks, benefits and alternatives for the proposed anesthesia with the patient or authorized representative who has indicated his/her understanding and acceptance.     Dental Advisory Given  Plan Discussed with: CRNA  Anesthesia Plan Comments:         Anesthesia Quick Evaluation

## 2022-04-02 NOTE — Op Note (Signed)
[facility] [name] Procedure Date: 04/02/2022 11:18 AM MRN: [id number] Date of Birth: Sep 10, 1986 Attending MD: [name] ,  CSN: [id number] Age: 36 Admit Type: Outpatient Procedure:                Colonoscopy Indications:              Rectal bleeding Providers:                [name], Caprice Kluver, Lambert Mody Referring MD:              Medicines:                Monitored Anesthesia Care Complications:            No immediate complications. Estimated Blood Loss:     Estimated blood loss: none. Procedure:                Pre-Anesthesia Assessment:                           - Prior to the procedure, a History and Physical                            was performed, and patient medications, allergies                            and sensitivities were reviewed. The patient's                            tolerance of previous anesthesia was reviewed.                           - The risks and benefits of the procedure and the                            sedation options and risks were discussed with the                            patient. All questions were answered and informed                            consent was obtained.                           - ASA Grade Assessment: II - A patient with mild                            systemic disease.                           After obtaining informed consent, the colonoscope                            was passed under direct vision. Throughout the                            procedure, the patient's blood pressure, pulse, and  oxygen saturations were monitored continuously. The                            [id number]-[id number] ([id number]) was introduced through the                            anus and advanced to the the cecum, identified by                            appendiceal orifice and ileocecal valve. The                            colonoscopy was performed without difficulty. The                             patient tolerated the procedure well. The quality                            of the bowel preparation was excellent. Scope In: 11:49:47 AM Scope Out: 12:18:30 PM Scope Withdrawal Time: 0 hours 15 minutes 38 seconds  Total Procedure Duration: 0 hours 28 minutes 43 seconds  Findings:      The perianal and digital rectal examinations were normal.      Three sessile polyps were found in the sigmoid colon and transverse       colon. The polyps were 5 to 6 mm in size. These polyps were removed with       a cold snare. Resection and retrieval were complete.      A single (solitary) five mm ulcer was found in the rectum. No bleeding       was present. Biopsies were taken with a cold forceps for histology.      The retroflexed view of the distal rectum and anal verge was normal and       showed no anal or rectal abnormalities.      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                            Resected and retrieved.                           - A single (solitary) ulcer in the rectum. Biopsied.                           - The distal rectum and anal verge are normal on                            retroflexion view. Moderate Sedation:      Per Anesthesia Care Recommendation:           - Discharge patient to home (  ambulatory).                           - Resume previous diet.                           - Await pathology results.                           - Repeat colonoscopy for surveillance based on                            pathology results.                           - Continue Linzess 145 mcg qday, can uptitrate if                            having to strain to have bowel movements. Procedure Code(s):        --- Professional ---                           [id number], Colonoscopy, flexible; with  removal of                            tumor(s), polyp(s), or other lesion(s) by snare                            technique                           45380, 31, Colonoscopy, flexible; with biopsy,                            single or multiple Diagnosis Code(s):        --- Professional ---                           K63.5, Polyp of colon                           K62.6, Ulcer of anus and rectum                           K62.5, Hemorrhage of anus and rectum CPT copyright 2019 American Medical Association. All rights reserved. The codes documented in this report are preliminary and upon coder review may  be revised to meet current compliance requirements. [name], MD [name],  04/02/2022 12:28:28 PM This report has been signed electronically. Number of Addenda: 0

## 2022-04-02 NOTE — Transfer of Care (Signed)
Immediate Anesthesia Transfer of Care Note  Patient: Judy Lang  Procedure(s) Performed: COLONOSCOPY WITH PROPOFOL POLYPECTOMY BIOPSY  Patient Location: Endoscopy Unit  Anesthesia Type:General  Level of Consciousness: awake  Airway & Oxygen Therapy: Patient Spontanous Breathing  Post-op Assessment: Report given to RN and Post -op Vital signs reviewed and stable  Post vital signs: Reviewed and stable  Last Vitals:  Vitals Value Taken Time  BP    Temp    Pulse    Resp    SpO2      Last Pain:  Vitals:   04/02/22 1146  TempSrc:   PainSc: 0-No pain      Patients Stated Pain Goal: 9 (60/45/40 9811)  Complications: No notable events documented.

## 2022-04-03 LAB — SURGICAL PATHOLOGY

## 2022-04-04 NOTE — Anesthesia Postprocedure Evaluation (Signed)
Anesthesia Post Note  Patient: DELINDA MALAN  Procedure(s) Performed: COLONOSCOPY WITH PROPOFOL POLYPECTOMY BIOPSY  Patient location during evaluation: Phase II Anesthesia Type: General Level of consciousness: awake Pain management: pain level controlled Vital Signs Assessment: post-procedure vital signs reviewed and stable Respiratory status: spontaneous breathing and respiratory function stable Cardiovascular status: blood pressure returned to baseline and stable Postop Assessment: no headache and no apparent nausea or vomiting Anesthetic complications: no Comments: Late entry   No notable events documented.   Last Vitals:  Vitals:   04/02/22 1222 04/02/22 1224  BP: (!) 78/66 102/79  Pulse:  71  Resp: 18 (!) 21  Temp: 36.6 C   SpO2: 100% 100%    Last Pain:  Vitals:   04/02/22 1224  TempSrc:   PainSc: 0-No pain                 Louann Sjogren

## 2022-04-08 ENCOUNTER — Encounter (INDEPENDENT_AMBULATORY_CARE_PROVIDER_SITE_OTHER): Payer: Self-pay | Admitting: *Deleted

## 2022-04-10 ENCOUNTER — Encounter (HOSPITAL_COMMUNITY): Payer: Self-pay | Admitting: Gastroenterology

## 2022-04-16 ENCOUNTER — Telehealth: Payer: Self-pay

## 2022-04-16 NOTE — Telephone Encounter (Signed)
04/16/2022: I left a message asked that she please return call.   Needs to be taken off schedule 05/01/2022 for Hemorrhoid banding, as patient does not have hemorrhoids.

## 2022-04-17 NOTE — Telephone Encounter (Signed)
Mitzie please take patient off the schedule for July 13,2023. Thanks  Patient aware she does not need banding of hemorrhoids and aware not to show up on 05/01/2022. Patient mentioned she was contacted by [facility] for a "ballon" in September.

## 2022-04-17 NOTE — Telephone Encounter (Signed)
Thanks, patient will undergo anorectal manometry at [facility]

## 2022-04-17 NOTE — Telephone Encounter (Signed)
I called and left a message asked that the patient please return call.  

## 2022-05-01 ENCOUNTER — Ambulatory Visit (INDEPENDENT_AMBULATORY_CARE_PROVIDER_SITE_OTHER): Payer: Medicaid Other | Admitting: Gastroenterology

## 2022-06-24 ENCOUNTER — Other Ambulatory Visit (INDEPENDENT_AMBULATORY_CARE_PROVIDER_SITE_OTHER): Payer: Self-pay

## 2022-06-24 DIAGNOSIS — R110 Nausea: Secondary | ICD-10-CM

## 2022-06-24 MED ORDER — HYOSCYAMINE SULFATE 0.125 MG PO TBDP: ORAL_TABLET | ORAL | 1 refills | Status: DC

## 2022-06-24 NOTE — Progress Notes (Signed)
Med sent.

## 2022-06-29 ENCOUNTER — Telehealth: Payer: Self-pay | Admitting: Gastroenterology

## 2022-06-29 NOTE — Telephone Encounter (Signed)
I called the patient today to discuss the results of the most recent endorectal manometry performed at [facility] on 04/08/2022.  Patient had normal resting pressure in the internal anal sphincter, external anal sphincter can maintain squeeze duration, there was a diminished ability to contract the external anal sphincter but it relaxed during straining.  50 cc water balloon was expelled adequately without presence of pelvic floor dysfunction.  There was early urge with balloon, which may indicate underlying hypersensitivity.   Unfortunately, the patient did not pick up the phone and I left a detailed voice message with the findings.  We will continue her on Linzess for now and we will evaluate her symptom improvement in follow-up appointment.

## 2022-06-30 ENCOUNTER — Telehealth (INDEPENDENT_AMBULATORY_CARE_PROVIDER_SITE_OTHER): Payer: Self-pay | Admitting: *Deleted

## 2022-06-30 NOTE — Telephone Encounter (Signed)
Fax from [facility] [address] Requesting refill on linzess 72 mcg #90 directions - take one daily 30 mins before breakfast. I saw in chart where Dr. Jenetta Downer increased her to 145 mcg after colonoscopy on path report. (03/24/22)  Left message to clarify with patient how she is taking med.

## 2022-07-01 ENCOUNTER — Other Ambulatory Visit: Payer: Self-pay | Admitting: Gastroenterology

## 2022-07-01 DIAGNOSIS — K5909 Other constipation: Secondary | ICD-10-CM

## 2022-07-01 MED ORDER — LINACLOTIDE 72 MCG PO CAPS: 72.0000 ug | ORAL_CAPSULE | Freq: Every day | ORAL | 3 refills | Status: DC

## 2022-07-01 NOTE — Telephone Encounter (Signed)
Medication sent.

## 2022-07-01 NOTE — Telephone Encounter (Signed)
I called patient to clarify and she told me she is just taking linzess 72 mcg one daily and doing well with that. She needs refill to [facility] [address]

## 2022-08-25 ENCOUNTER — Other Ambulatory Visit (INDEPENDENT_AMBULATORY_CARE_PROVIDER_SITE_OTHER): Payer: Self-pay | Admitting: Gastroenterology

## 2022-08-25 DIAGNOSIS — K5909 Other constipation: Secondary | ICD-10-CM

## 2022-08-25 DIAGNOSIS — R112 Nausea with vomiting, unspecified: Secondary | ICD-10-CM

## 2022-08-25 DIAGNOSIS — R1084 Generalized abdominal pain: Secondary | ICD-10-CM

## 2022-08-26 NOTE — Telephone Encounter (Signed)
I refilled Ansapaz (the one covered by her insurance) in September

## 2022-08-26 NOTE — Telephone Encounter (Signed)
Last ov 03/24/22

## 2022-09-09 ENCOUNTER — Encounter (INDEPENDENT_AMBULATORY_CARE_PROVIDER_SITE_OTHER): Payer: Self-pay | Admitting: Gastroenterology

## 2022-09-15 ENCOUNTER — Ambulatory Visit: Payer: Self-pay | Admitting: Adult Health

## 2022-09-25 ENCOUNTER — Ambulatory Visit (INDEPENDENT_AMBULATORY_CARE_PROVIDER_SITE_OTHER): Payer: Medicaid Other | Admitting: Gastroenterology

## 2022-10-07 ENCOUNTER — Ambulatory Visit: Admit: 2022-10-07 | Payer: Self-pay

## 2022-10-16 ENCOUNTER — Ambulatory Visit: Admit: 2022-10-16 | Payer: Medicaid Other

## 2022-10-29 ENCOUNTER — Other Ambulatory Visit (HOSPITAL_COMMUNITY)
Admission: RE | Admit: 2022-10-29 | Discharge: 2022-10-29 | Disposition: A | Discharge status: Home / Self Care | Payer: Medicaid Other | Source: Ambulatory Visit | Attending: Adult Health | Admitting: Adult Health

## 2022-10-29 ENCOUNTER — Encounter: Payer: Self-pay | Admitting: Adult Health

## 2022-10-29 ENCOUNTER — Ambulatory Visit (INDEPENDENT_AMBULATORY_CARE_PROVIDER_SITE_OTHER): Payer: Medicaid Other | Admitting: Adult Health

## 2022-10-29 VITALS — BP 104/71 | HR 84 | Ht 63.0 in | Wt 107.0 lb

## 2022-10-29 DIAGNOSIS — Z113 Encounter for screening for infections with a predominantly sexual mode of transmission: Secondary | ICD-10-CM | POA: Insufficient documentation

## 2022-10-29 DIAGNOSIS — Z3202 Encounter for pregnancy test, result negative: Secondary | ICD-10-CM | POA: Diagnosis not present

## 2022-10-29 DIAGNOSIS — Z0000 Encounter for general adult medical examination without abnormal findings: Secondary | ICD-10-CM

## 2022-10-29 DIAGNOSIS — Z01419 Encounter for gynecological examination (general) (routine) without abnormal findings: Secondary | ICD-10-CM

## 2022-10-29 LAB — POCT URINE PREGNANCY: Preg Test, Ur: NEGATIVE

## 2022-10-29 MED ORDER — PRENATAL PLUS 27-1 MG PO TABS: 1.0000 | ORAL_TABLET | Freq: Every day | ORAL | 12 refills | Status: DC

## 2022-10-29 NOTE — Progress Notes (Signed)
Patient ID: Judy Lang, female   DOB: 01-03-1986, 37 y.o.   MRN: [id number] History of Present Illness: Judy Lang is a 37 year old white female, with DP, [id number], in for a well woman gyn exam. She is not sure about birth control, may want another child.  Last pap was negative HPV and NILM 08/22/21  PCP is A [place] PA.   Current Medications, Allergies, Past Medical History, Past Surgical History, Family History and Social History were reviewed in [employer] record.     Review of Systems: Patient denies any headaches, hearing loss, fatigue, blurred vision, shortness of breath, chest pain, abdominal pain, problems with bowel movements, urination, or intercourse. No joint pain or mood swings.     Physical Exam:BP 104/71 (BP Location: Left Arm, Patient Position: Sitting, Cuff Size: Normal)   Pulse 84   Ht '5\' 3"'$  (1.6 m)   Wt 107 lb (48.5 kg)   LMP 09/21/2022   BMI 18.95 kg/m  UPT was negative  General:  Well developed, well nourished, no acute distress Skin:  Warm and dry Neck:  Midline trachea, normal thyroid, good ROM, no lymphadenopathy Lungs; Clear to auscultation bilaterally Breast:  No dominant palpable mass, retraction, or nipple discharge Cardiovascular: Regular rate and rhythm Abdomen:  Soft, non tender, no hepatosplenomegaly Pelvic:  External genitalia is normal in appearance, no lesions.  The vagina is normal in appearance. Urethra has no lesions or masses. The cervix is bulbous.  Uterus is felt to be normal size, shape, and contour.  No adnexal masses or tenderness noted.Bladder is non tender, no masses felt. CV swab obtained. Extremities/musculoskeletal:  No swelling or varicosities noted, no clubbing or cyanosis Psych:  No mood changes, alert and cooperative,seems happy AA is 1 Fall risk is low    10/29/2022   10:57 AM 08/22/2021    8:51 AM 11/01/2019   10:43 AM  Depression screen PHQ 2/9  Decreased Interest 2 1 0  Down, Depressed, Hopeless 0  0 0  PHQ - 2 Score 2 1 0  Altered sleeping 3 3   Tired, decreased energy 1 1   Change in appetite 1 2   Feeling bad or failure about yourself  0 0   Trouble concentrating 0 0   Moving slowly or fidgety/restless 0 1   Suicidal thoughts 0 0   PHQ-9 Score 7 8        10/29/2022   10:58 AM 08/22/2021    8:51 AM  GAD 7 : Generalized Anxiety Score  Nervous, Anxious, on Edge 1 1  Control/stop worrying 1 1  Worry too much - different things 1 1  Trouble relaxing 1 1  Restless 1 1  Easily annoyed or irritable 2 0  Afraid - awful might happen 0 1  Total GAD 7 Score 7 6  On buspar  Upstream - 10/29/22 1046       Pregnancy Intention Screening   Does the patient want to become pregnant in the next year? Unsure    Does the patient's partner want to become pregnant in the next year? No    Would the patient like to discuss contraceptive options today? Yes      Contraception Wrap Up   Current Method No Method - Other Reason    End Method Female Condom    Contraception Counseling Provided Yes    How was the end contraceptive method provided? N/A            Examination chaperoned by  Levy Pupa LPN    Impression and Plan: 1. Pregnancy examination or test, negative result  2. Encounter for well woman exam with routine gynecological exam Physical in 1 year Labs per PCP  3. Screening examination for STD (sexually transmitted disease) CV swab sent for GC/CHL,trich,BV and yeast

## 2022-10-30 ENCOUNTER — Other Ambulatory Visit: Payer: Self-pay | Admitting: Adult Health

## 2022-10-30 LAB — CERVICOVAGINAL ANCILLARY ONLY
Bacterial Vaginitis (gardnerella): POSITIVE — AB
Candida Glabrata: NEGATIVE
Candida Vaginitis: NEGATIVE
Chlamydia: NEGATIVE
Comment: NEGATIVE
Comment: NEGATIVE
Comment: NEGATIVE
Comment: NEGATIVE
Comment: NEGATIVE
Comment: NORMAL
Neisseria Gonorrhea: NEGATIVE
Trichomonas: NEGATIVE

## 2022-10-30 MED ORDER — METRONIDAZOLE 500 MG PO TABS: 500.0000 mg | ORAL_TABLET | Freq: Two times a day (BID) | ORAL | 0 refills | Status: DC

## 2022-10-30 NOTE — Progress Notes (Signed)
+  BV on vaginal swab, will rx flagyl, no sex or alcohol while taking

## 2022-11-27 ENCOUNTER — Ambulatory Visit (INDEPENDENT_AMBULATORY_CARE_PROVIDER_SITE_OTHER): Payer: Medicaid Other | Admitting: Gastroenterology

## 2022-11-27 ENCOUNTER — Encounter (INDEPENDENT_AMBULATORY_CARE_PROVIDER_SITE_OTHER): Payer: Self-pay | Admitting: Gastroenterology

## 2022-11-27 VITALS — BP 108/79 | HR 93 | Temp 98.9°F | Ht 63.0 in | Wt 101.6 lb

## 2022-11-27 DIAGNOSIS — K219 Gastro-esophageal reflux disease without esophagitis: Secondary | ICD-10-CM | POA: Diagnosis not present

## 2022-11-27 DIAGNOSIS — K581 Irritable bowel syndrome with constipation: Secondary | ICD-10-CM | POA: Diagnosis not present

## 2022-11-27 NOTE — Patient Instructions (Addendum)
Please continue your linzess 11mg every other day Increase water intake, aim for atleast 64 oz per day Increase fruits, veggies and whole grains, kiwi and prunes are especially good for constipation You can use hyoscyamine up to twice a day as needed for abdominal pain  Avoid greasy, spicy, fried, citrus foods, and be mindful that caffeine, carbonated drinks, chocolate and alcohol can increase reflux symptoms. Stay upright 2-3 hours after eating, prior to lying down and avoid eating late in the evenings. You can use over the counter pepcid/famotidine '20mg'$  if you are having heartburn. If this becomes more frequent, occurring multiple times per week, please make me aware  Follow up 1 year   It was a pleasure to see you today. I want to create trusting relationships with patients and provide genuine, compassionate, and quality care. I truly value your feedback! please be on the lookout for a survey regarding your visit with me today. I appreciate your input about our visit and your time in completing this!    Christyna Letendre L. CAlver Sorrow MSN, APRN, AGNP-C Adult-Gerontology Nurse Practitioner [facility]
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
## 2022-11-28 NOTE — Addendum Note (Signed)
Addended by: Harvel Quale on: 11/28/2022 10:51 AM   Modules accepted: Level of Service

## 2023-04-01 ENCOUNTER — Other Ambulatory Visit (INDEPENDENT_AMBULATORY_CARE_PROVIDER_SITE_OTHER): Payer: Self-pay | Admitting: Gastroenterology

## 2023-09-03 ENCOUNTER — Other Ambulatory Visit (INDEPENDENT_AMBULATORY_CARE_PROVIDER_SITE_OTHER): Payer: Self-pay

## 2023-09-03 DIAGNOSIS — K5909 Other constipation: Secondary | ICD-10-CM

## 2023-09-03 MED ORDER — LINACLOTIDE 72 MCG PO CAPS: 72.0000 ug | ORAL_CAPSULE | Freq: Every day | ORAL | 3 refills | Status: AC

## 2023-09-03 NOTE — Telephone Encounter (Signed)
Per last office visit IBS: well managed with linzess x2 every other day.  PLAN:  Continue hyoscyamine PRN  2. Continue linzess every other day  3. Avoid trigger foods, especially alcohol  4. Reflux precautions, pepcid 20mg  PRN   Please advise. Should the patient dosage be changed to the Linzess 145 mg every other day? If so or if not please submit to the patient [facility] on [address]. If there is a change please let me know so I can notify the patient of the change. Thanks

## 2023-09-24 ENCOUNTER — Encounter: Payer: Self-pay | Admitting: Adult Health

## 2023-09-24 ENCOUNTER — Ambulatory Visit (INDEPENDENT_AMBULATORY_CARE_PROVIDER_SITE_OTHER): Payer: Medicaid Other | Admitting: Adult Health

## 2023-09-24 VITALS — BP 113/76 | HR 94 | Ht 63.0 in | Wt 105.0 lb

## 2023-09-24 DIAGNOSIS — Z3202 Encounter for pregnancy test, result negative: Secondary | ICD-10-CM | POA: Diagnosis not present

## 2023-09-24 DIAGNOSIS — Z30011 Encounter for initial prescription of contraceptive pills: Secondary | ICD-10-CM

## 2023-09-24 DIAGNOSIS — R102 Pelvic and perineal pain: Secondary | ICD-10-CM | POA: Diagnosis not present

## 2023-09-24 LAB — POCT URINE PREGNANCY: Preg Test, Ur: NEGATIVE
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
## 2023-10-06 ENCOUNTER — Other Ambulatory Visit: Payer: Self-pay | Admitting: Adult Health

## 2023-10-06 MED ORDER — HYDROXYZINE HCL 10 MG PO TABS: 10.0000 mg | ORAL_TABLET | Freq: Three times a day (TID) | ORAL | 0 refills | Status: AC | PRN

## 2023-10-06 MED ORDER — ESCITALOPRAM OXALATE 10 MG PO TABS: 10.0000 mg | ORAL_TABLET | Freq: Every day | ORAL | 2 refills | Status: DC

## 2023-10-06 NOTE — Progress Notes (Signed)
Rx lexapro 10 mg 1 daily and vistaril prn

## 2023-11-23 ENCOUNTER — Other Ambulatory Visit: Payer: Self-pay | Admitting: Adult Health

## 2023-11-23 ENCOUNTER — Ambulatory Visit: Payer: Medicaid Other | Admitting: Women's Health

## 2023-11-24 ENCOUNTER — Ambulatory Visit: Payer: Medicaid Other | Admitting: Adult Health

## 2023-11-24 ENCOUNTER — Other Ambulatory Visit (HOSPITAL_COMMUNITY)
Admission: RE | Admit: 2023-11-24 | Discharge: 2023-11-24 | Disposition: A | Discharge status: Home / Self Care | Payer: Medicaid Other | Source: Ambulatory Visit | Attending: Adult Health | Admitting: Adult Health

## 2023-11-24 ENCOUNTER — Encounter: Payer: Self-pay | Admitting: Adult Health

## 2023-11-24 VITALS — BP 102/67 | HR 75 | Ht 64.0 in | Wt 101.5 lb

## 2023-11-24 DIAGNOSIS — M898X1 Other specified disorders of bone, shoulder: Secondary | ICD-10-CM | POA: Diagnosis not present

## 2023-11-24 DIAGNOSIS — Z3041 Encounter for surveillance of contraceptive pills: Secondary | ICD-10-CM

## 2023-11-24 DIAGNOSIS — Z1331 Encounter for screening for depression: Secondary | ICD-10-CM

## 2023-11-24 DIAGNOSIS — Z0000 Encounter for general adult medical examination without abnormal findings: Secondary | ICD-10-CM

## 2023-11-24 DIAGNOSIS — F419 Anxiety disorder, unspecified: Secondary | ICD-10-CM

## 2023-11-24 DIAGNOSIS — F32A Depression, unspecified: Secondary | ICD-10-CM

## 2023-11-24 DIAGNOSIS — Z3202 Encounter for pregnancy test, result negative: Secondary | ICD-10-CM

## 2023-11-24 DIAGNOSIS — R102 Pelvic and perineal pain: Secondary | ICD-10-CM

## 2023-11-24 LAB — POCT URINE PREGNANCY: Preg Test, Ur: NEGATIVE

## 2023-11-24 MED ORDER — LO LOESTRIN FE 1 MG-10 MCG / 10 MCG PO TABS: 1.0000 | ORAL_TABLET | Freq: Every day | ORAL | 11 refills | Status: AC

## 2023-11-24 MED ORDER — ESCITALOPRAM OXALATE 10 MG PO TABS: 10.0000 mg | ORAL_TABLET | Freq: Every day | ORAL | 12 refills | Status: AC
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
## 2023-11-30 ENCOUNTER — Ambulatory Visit (INDEPENDENT_AMBULATORY_CARE_PROVIDER_SITE_OTHER): Payer: Medicaid Other | Admitting: Gastroenterology

## 2023-11-30 ENCOUNTER — Encounter (INDEPENDENT_AMBULATORY_CARE_PROVIDER_SITE_OTHER): Payer: Self-pay | Admitting: Gastroenterology

## 2023-11-30 VITALS — BP 105/70 | HR 76 | Temp 97.8°F | Ht 62.0 in | Wt 104.3 lb

## 2023-11-30 DIAGNOSIS — K581 Irritable bowel syndrome with constipation: Secondary | ICD-10-CM

## 2023-11-30 DIAGNOSIS — K219 Gastro-esophageal reflux disease without esophagitis: Secondary | ICD-10-CM

## 2023-11-30 LAB — CYTOLOGY - PAP
Chlamydia: NEGATIVE
Comment: NEGATIVE
Comment: NEGATIVE
Comment: NORMAL
Diagnosis: NEGATIVE
High risk HPV: NEGATIVE
Neisseria Gonorrhea: NEGATIVE
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
## 2023-11-30 NOTE — Patient Instructions (Signed)
 Continue Levsin as needed for abdominal pain  Continue Linzess  145 mcg every other day.  Can take Pepcid  20 mg as needed for heartburn.

## 2023-12-03 ENCOUNTER — Ambulatory Visit: Payer: Medicaid Other | Admitting: Orthopedic Surgery

## 2023-12-03 ENCOUNTER — Ambulatory Visit: Payer: Medicaid Other | Admitting: Adult Health

## 2023-12-11 ENCOUNTER — Ambulatory Visit: Payer: Medicaid Other | Admitting: Orthopedic Surgery

## 2023-12-11 ENCOUNTER — Encounter: Payer: Self-pay | Admitting: Orthopedic Surgery

## 2023-12-11 ENCOUNTER — Other Ambulatory Visit (INDEPENDENT_AMBULATORY_CARE_PROVIDER_SITE_OTHER): Payer: Medicaid Other

## 2023-12-11 VITALS — BP 120/80 | HR 80 | Ht 62.0 in | Wt 103.0 lb

## 2023-12-11 DIAGNOSIS — M546 Pain in thoracic spine: Secondary | ICD-10-CM

## 2023-12-11 DIAGNOSIS — M25511 Pain in right shoulder: Secondary | ICD-10-CM | POA: Diagnosis not present

## 2023-12-11 DIAGNOSIS — M545 Low back pain, unspecified: Secondary | ICD-10-CM | POA: Diagnosis not present

## 2023-12-11 DIAGNOSIS — M542 Cervicalgia: Secondary | ICD-10-CM | POA: Diagnosis not present
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
## 2024-01-21 ENCOUNTER — Encounter (HOSPITAL_COMMUNITY): Payer: Self-pay

## 2024-01-21 ENCOUNTER — Other Ambulatory Visit: Payer: Self-pay

## 2024-01-21 ENCOUNTER — Ambulatory Visit (HOSPITAL_COMMUNITY): Attending: Orthopedic Surgery

## 2024-01-21 DIAGNOSIS — M545 Low back pain, unspecified: Secondary | ICD-10-CM | POA: Insufficient documentation

## 2024-01-21 DIAGNOSIS — M5459 Other low back pain: Secondary | ICD-10-CM | POA: Diagnosis present

## 2024-01-21 DIAGNOSIS — M542 Cervicalgia: Secondary | ICD-10-CM | POA: Insufficient documentation

## 2024-01-21 DIAGNOSIS — R2689 Other abnormalities of gait and mobility: Secondary | ICD-10-CM | POA: Insufficient documentation

## 2024-01-21 DIAGNOSIS — M546 Pain in thoracic spine: Secondary | ICD-10-CM | POA: Insufficient documentation
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
## 2024-01-27 ENCOUNTER — Ambulatory Visit (HOSPITAL_COMMUNITY)

## 2024-01-27 ENCOUNTER — Encounter (HOSPITAL_COMMUNITY): Payer: Self-pay

## 2024-01-27 ENCOUNTER — Telehealth (HOSPITAL_COMMUNITY): Payer: Self-pay

## 2024-01-27 DIAGNOSIS — M5459 Other low back pain: Secondary | ICD-10-CM

## 2024-01-27 DIAGNOSIS — R2689 Other abnormalities of gait and mobility: Secondary | ICD-10-CM

## 2024-01-27 NOTE — Telephone Encounter (Signed)
 Initially thought a no show, pt clocked in while therapist was calling.   Becky Sax, LPTA/CLT; [facility] [phone]
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
## 2024-02-02 ENCOUNTER — Ambulatory Visit (HOSPITAL_COMMUNITY): Admitting: Physical Therapy

## 2024-02-02 DIAGNOSIS — R2689 Other abnormalities of gait and mobility: Secondary | ICD-10-CM

## 2024-02-02 DIAGNOSIS — M5459 Other low back pain: Secondary | ICD-10-CM | POA: Diagnosis not present
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
## 2024-02-04 ENCOUNTER — Telehealth (HOSPITAL_COMMUNITY): Payer: Self-pay

## 2024-02-04 ENCOUNTER — Encounter (HOSPITAL_COMMUNITY)

## 2024-02-04 NOTE — Telephone Encounter (Signed)
 [facility] no show, called and left message concerning missed apt. Included next apt date and time with contact information included if needs to cancel or reschedule next apt.   Minor Amble, LPTA/CLT; Johnye Napoleon [phone]

## 2024-02-05 ENCOUNTER — Ambulatory Visit (HOSPITAL_COMMUNITY)

## 2024-02-05 ENCOUNTER — Encounter (HOSPITAL_COMMUNITY): Payer: Self-pay

## 2024-02-05 DIAGNOSIS — M5459 Other low back pain: Secondary | ICD-10-CM

## 2024-02-05 DIAGNOSIS — R2689 Other abnormalities of gait and mobility: Secondary | ICD-10-CM
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
## 2024-02-17 ENCOUNTER — Ambulatory Visit (HOSPITAL_COMMUNITY)

## 2024-02-17 ENCOUNTER — Encounter (HOSPITAL_COMMUNITY): Payer: Self-pay

## 2024-02-17 DIAGNOSIS — M5459 Other low back pain: Secondary | ICD-10-CM

## 2024-02-17 DIAGNOSIS — R2689 Other abnormalities of gait and mobility: Secondary | ICD-10-CM
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
## 2024-02-19 ENCOUNTER — Encounter (HOSPITAL_COMMUNITY): Payer: Self-pay

## 2024-02-19 ENCOUNTER — Encounter (HOSPITAL_COMMUNITY)

## 2024-02-19 NOTE — Therapy (Signed)
 [facility] Rehabilitation at [facility] [address] [place], [place], [address] Phone: [phone]   Fax:  [phone]  Patient Details  Name: Judy Lang MRN: [id number] Date of Birth: 1986/10/19 Referring Provider:  No ref. provider found  Encounter Date: 02/19/2024  Pt was called concerning her 8:45AM appointment this morning, no answer but pt was left message. We will look forward to seeing her at her next scheduled appointment.  Armond Bertin, PT, DPT [facility] Office: [phone] 9:22 AM, 02/19/24   [facility] Out[facility] Rehabilitation at [facility] [address] [place], [place], [address] Phone: [phone]   Fax:  [phone]

## 2024-02-22 ENCOUNTER — Ambulatory Visit (HOSPITAL_COMMUNITY): Attending: Orthopedic Surgery | Admitting: Physical Therapy

## 2024-02-22 DIAGNOSIS — R2689 Other abnormalities of gait and mobility: Secondary | ICD-10-CM | POA: Diagnosis present

## 2024-02-22 DIAGNOSIS — M5459 Other low back pain: Secondary | ICD-10-CM | POA: Diagnosis present
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
## 2024-02-24 ENCOUNTER — Ambulatory Visit (HOSPITAL_COMMUNITY)

## 2024-02-24 ENCOUNTER — Encounter (HOSPITAL_COMMUNITY): Payer: Self-pay

## 2024-02-24 DIAGNOSIS — M5459 Other low back pain: Secondary | ICD-10-CM

## 2024-02-24 DIAGNOSIS — R2689 Other abnormalities of gait and mobility: Secondary | ICD-10-CM
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
## 2024-02-29 ENCOUNTER — Encounter (HOSPITAL_COMMUNITY): Admitting: Physical Therapy

## 2024-02-29 ENCOUNTER — Telehealth (HOSPITAL_COMMUNITY): Payer: Self-pay | Admitting: Physical Therapy

## 2024-02-29 NOTE — Telephone Encounter (Signed)
 Pt showed too late to be treated for appointment.   Lorenso Romance, PTA/CLT [facility] Ph: [phone]

## 2024-03-02 ENCOUNTER — Ambulatory Visit (HOSPITAL_COMMUNITY): Admitting: Physical Therapy

## 2024-03-02 DIAGNOSIS — R2689 Other abnormalities of gait and mobility: Secondary | ICD-10-CM

## 2024-03-02 DIAGNOSIS — M5459 Other low back pain: Secondary | ICD-10-CM
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
## 2024-03-07 ENCOUNTER — Encounter (HOSPITAL_COMMUNITY): Admitting: Physical Therapy

## 2024-03-09 ENCOUNTER — Encounter (HOSPITAL_COMMUNITY): Payer: Self-pay

## 2024-03-09 ENCOUNTER — Encounter (HOSPITAL_COMMUNITY)
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
## 2024-03-15 ENCOUNTER — Ambulatory Visit (HOSPITAL_COMMUNITY)

## 2024-03-15 ENCOUNTER — Encounter (HOSPITAL_COMMUNITY): Payer: Self-pay

## 2024-03-15 DIAGNOSIS — R2689 Other abnormalities of gait and mobility: Secondary | ICD-10-CM

## 2024-03-15 DIAGNOSIS — M5459 Other low back pain: Secondary | ICD-10-CM | POA: Diagnosis not present
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
## 2024-03-22 ENCOUNTER — Ambulatory Visit (HOSPITAL_COMMUNITY): Attending: Orthopedic Surgery

## 2024-03-22 ENCOUNTER — Encounter (HOSPITAL_COMMUNITY): Payer: Self-pay

## 2024-03-22 DIAGNOSIS — M5459 Other low back pain: Secondary | ICD-10-CM | POA: Diagnosis present

## 2024-03-22 DIAGNOSIS — R2689 Other abnormalities of gait and mobility: Secondary | ICD-10-CM | POA: Diagnosis present
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
## 2024-03-29 ENCOUNTER — Ambulatory Visit (HOSPITAL_COMMUNITY)

## 2024-03-29 ENCOUNTER — Encounter (HOSPITAL_COMMUNITY): Payer: Self-pay

## 2024-03-29 DIAGNOSIS — M5459 Other low back pain: Secondary | ICD-10-CM

## 2024-03-29 DIAGNOSIS — R2689 Other abnormalities of gait and mobility: Secondary | ICD-10-CM
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
## 2024-04-06 ENCOUNTER — Encounter (HOSPITAL_COMMUNITY): Payer: Self-pay

## 2024-04-06 ENCOUNTER — Encounter (HOSPITAL_COMMUNITY)

## 2024-04-06 NOTE — Therapy (Signed)
 [facility] Outpatient Rehabilitation at [facility] [address] [place], [place], [address] Phone: [phone]   Fax:  [phone]  Patient Details  Name: Judy Lang MRN: [id number] Date of Birth: [birth year] Referring Provider:  No ref. provider found  Encounter Date: 04/06/2024   Pt was called concerning her missed appointment this morning at 8am. Pt was left a message informing her that this was her last scheduled visit and to call back should she want to reschedule.   Armond Bertin, PT, DPT [facility] Office: [phone] 8:35 AM, 04/06/24   [facility] at [facility] [address] [place], [place], [address] Phone: [phone]   Fax:  [phone]

## 2024-04-07 ENCOUNTER — Encounter (HOSPITAL_COMMUNITY): Payer: Self-pay

## 2024-04-07 ENCOUNTER — Ambulatory Visit (HOSPITAL_COMMUNITY)

## 2024-04-07 DIAGNOSIS — M5459 Other low back pain: Secondary | ICD-10-CM

## 2024-04-07 DIAGNOSIS — R2689 Other abnormalities of gait and mobility: Secondary | ICD-10-CM
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
## 2024-05-21 ENCOUNTER — Other Ambulatory Visit (INDEPENDENT_AMBULATORY_CARE_PROVIDER_SITE_OTHER): Payer: Self-pay | Admitting: Gastroenterology

## 2024-08-03 ENCOUNTER — Encounter (INDEPENDENT_AMBULATORY_CARE_PROVIDER_SITE_OTHER): Payer: Self-pay | Admitting: Gastroenterology
